# Patient Record
Sex: Female | Born: 1990 | Race: Black or African American | Hispanic: No | Marital: Single | Smoking: Former smoker
Health system: Southern US, Community
[De-identification: ages and names within clinical notes are randomized; demographics above are authoritative.]

## PROBLEM LIST (undated history)

## (undated) ENCOUNTER — Inpatient Hospital Stay (HOSPITAL_COMMUNITY): Payer: Self-pay

## (undated) DIAGNOSIS — G43909 Migraine, unspecified, not intractable, without status migrainosus: Secondary | ICD-10-CM

## (undated) DIAGNOSIS — Z202 Contact with and (suspected) exposure to infections with a predominantly sexual mode of transmission: Secondary | ICD-10-CM

## (undated) DIAGNOSIS — A749 Chlamydial infection, unspecified: Secondary | ICD-10-CM

## (undated) DIAGNOSIS — A549 Gonococcal infection, unspecified: Secondary | ICD-10-CM

## (undated) HISTORY — PX: NO PAST SURGERIES: SHX2092

## (undated) HISTORY — DX: Chlamydial infection, unspecified: A74.9

---
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
## 2011-01-05 ENCOUNTER — Telehealth: Payer: Self-pay | Admitting: Family Medicine

## 2011-01-05 NOTE — Telephone Encounter (Signed)
Left vm to return call, need to update address & phone number, once done see E. Odell to remail letter.

## 2011-01-15 ENCOUNTER — Encounter: Payer: Self-pay | Admitting: Family Medicine

## 2011-01-15 ENCOUNTER — Ambulatory Visit (INDEPENDENT_AMBULATORY_CARE_PROVIDER_SITE_OTHER): Payer: Medicaid Other | Admitting: Family Medicine

## 2011-01-15 DIAGNOSIS — R300 Dysuria: Secondary | ICD-10-CM

## 2011-01-15 DIAGNOSIS — N898 Other specified noninflammatory disorders of vagina: Secondary | ICD-10-CM

## 2011-01-15 DIAGNOSIS — N760 Acute vaginitis: Secondary | ICD-10-CM

## 2011-01-15 LAB — POCT URINALYSIS DIPSTICK
Bilirubin, UA: NEGATIVE
Blood, UA: NEGATIVE
Glucose, UA: NEGATIVE
Nitrite, UA: NEGATIVE

## 2011-01-15 LAB — POCT WET PREP (WET MOUNT): Trichomonas Wet Prep HPF POC: NEGATIVE

## 2011-01-16 ENCOUNTER — Encounter: Payer: Self-pay | Admitting: Family Medicine

## 2011-01-18 DIAGNOSIS — R300 Dysuria: Secondary | ICD-10-CM | POA: Insufficient documentation

## 2011-01-18 DIAGNOSIS — N898 Other specified noninflammatory disorders of vagina: Secondary | ICD-10-CM | POA: Insufficient documentation

## 2011-01-18 NOTE — Assessment & Plan Note (Signed)
Urinalysis does not indicate uti.  Will obtain urine culture and screen for STD's.  No red flag symptoms at this time.  Pt told to return for any red flag symptoms or new or worsening of symptoms.

## 2011-01-18 NOTE — Progress Notes (Signed)
  Subjective:    Patient ID: Shari Davidson, female    DOB: 1990-11-07, 20 y.o.   MRN: [id number]  HPI Dysuria: Pt describes as a "irritation" with urination off and on x 3 weeks.  No low back pain today.  No flank pain.   Occasional pain in lower abdomen- no currently. .  No urinary retention.  No urinary frequency.  No fever.    Vaginal discharge: X 1 week, white in color, no itching.  No odor.  Pt states she is not at risk for STD's.  Has not had unprotected sex.    Review of Systems    as per above.  Objective:   Physical Exam  Constitutional: She is oriented to person, place, and time. She appears well-developed and well-nourished.  Cardiovascular: Normal rate.   No murmur heard. Pulmonary/Chest: Effort normal. No respiratory distress. She has no wheezes.  Abdominal: Soft. She exhibits no distension. There is no tenderness. There is no rebound and no guarding.  Genitourinary: Vagina normal and uterus normal. There is no rash or lesion on the right labia. There is no rash or lesion on the left labia. Cervix exhibits no motion tenderness. Right adnexum displays no mass, no tenderness and no fullness. Left adnexum displays no mass, no tenderness and no fullness.       Scant white discharge  Neurological: She is alert and oriented to person, place, and time.  Skin: No rash noted.          Assessment & Plan:

## 2011-01-18 NOTE — Assessment & Plan Note (Signed)
Pt screened for GC/Chlam/trich/BV and yeast.  Will call pt if any medication needed with results.  If negative will send letter with results.

## 2011-01-19 ENCOUNTER — Ambulatory Visit: Payer: Medicaid Other | Admitting: Family Medicine

## 2011-02-28 ENCOUNTER — Inpatient Hospital Stay (INDEPENDENT_AMBULATORY_CARE_PROVIDER_SITE_OTHER)
Admission: RE | Admit: 2011-02-28 | Discharge: 2011-02-28 | Disposition: A | Discharge status: Home / Self Care | Payer: Medicaid Other | Source: Ambulatory Visit | Attending: Family Medicine | Admitting: Family Medicine

## 2011-02-28 DIAGNOSIS — N760 Acute vaginitis: Secondary | ICD-10-CM

## 2011-02-28 DIAGNOSIS — N390 Urinary tract infection, site not specified: Secondary | ICD-10-CM

## 2011-02-28 DIAGNOSIS — R51 Headache: Secondary | ICD-10-CM

## 2011-02-28 LAB — POCT URINALYSIS DIP (DEVICE)
Bilirubin Urine: NEGATIVE
Ketones, ur: NEGATIVE mg/dL
Nitrite: NEGATIVE
pH: 6 (ref 5.0–8.0)

## 2011-02-28 LAB — WET PREP, GENITAL: Trich, Wet Prep: NONE SEEN

## 2011-03-02 LAB — URINE CULTURE

## 2011-03-03 LAB — GC/CHLAMYDIA PROBE AMP, GENITAL: Chlamydia, DNA Probe: NEGATIVE

## 2011-03-19 ENCOUNTER — Ambulatory Visit (INDEPENDENT_AMBULATORY_CARE_PROVIDER_SITE_OTHER): Payer: Medicaid Other | Admitting: Family Medicine

## 2011-03-19 ENCOUNTER — Encounter: Payer: Self-pay | Admitting: Family Medicine

## 2011-03-19 DIAGNOSIS — R300 Dysuria: Secondary | ICD-10-CM

## 2011-03-19 DIAGNOSIS — R42 Dizziness and giddiness: Secondary | ICD-10-CM

## 2011-03-19 DIAGNOSIS — Z309 Encounter for contraceptive management, unspecified: Secondary | ICD-10-CM

## 2011-03-19 DIAGNOSIS — K5900 Constipation, unspecified: Secondary | ICD-10-CM

## 2011-03-19 DIAGNOSIS — G44209 Tension-type headache, unspecified, not intractable: Secondary | ICD-10-CM

## 2011-03-19 LAB — POCT URINE PREGNANCY: Preg Test, Ur: NEGATIVE

## 2011-03-19 LAB — POCT URINALYSIS DIPSTICK
Glucose, UA: NEGATIVE
Nitrite, UA: NEGATIVE
Urobilinogen, UA: 0.2

## 2011-03-19 MED ORDER — BISACODYL 10 MG RE SUPP: 10.0000 mg | RECTAL | Status: AC | PRN

## 2011-03-19 NOTE — Progress Notes (Signed)
  Subjective:    Patient ID: Shari Davidson, female    DOB: April 02, 1991, 20 y.o.   MRN: [id number]  HPI 1. Dizziness, vomiting, sharp pains a couple of days ago.  Resolved after that day. No more vomiting.  But today, complaining of lightheadedness when she stands up lasting a few seconds and headaches. Headaches are different from migraine headaches. Temporal, left-sided. No associated nausea/vomiting. No photo- or phonophobia.   Drinks 4 cups of liquids a day. Good appetite after that day of vomiting.   2. Constipation  No bowel movement past week. Usually no BM 2-4 weeks.  When she does get it, it is runny.  Tried Miralax but didn't help. Took one capful a day.  Now taking OTC tablets, 1 a day. Unsure of name.   Review of Systems Denies blood in urine or stool.  Denies chest pain, difficulty breathing.  Denies back pain.  Denies fevers, chills.  Dysuria. Treated for urine infection at UC recently. Finished course of antibiotics. Now still has some mild dysuria.  May have mild increase in stressors. But denies depression, feeling overwhelmed.     Objective:   Physical Exam Gen: NAD Psych: pleasant, engaged, appropriate to questions, slightly anxious-appearing, not depressed-appearing Mouth: MMM Retina: sharp discs Neuro: EOMI with no nystagmus, no focal deficits CV: RRR, normal S1/S2, no murmurs Abd: NABS, soft, ND, mild TTP lower quadrant Ext: warm, dry, 3-4 sec cap refill Back: no CVA TTP    Assessment & Plan:

## 2011-03-19 NOTE — Assessment & Plan Note (Signed)
Recently finished antibiotics for UTI diagnosed at an Urgent Care. Still occasional mild dysuria but UA today not indicative of infection. Reassured patient. May be due to dehydration. Encouraged fluids.

## 2011-03-19 NOTE — Assessment & Plan Note (Signed)
Mild. Lasting few seconds with positional changes. Likely due to dehydration. Vital signs good and appears well.  Encouraged hydration.

## 2011-03-19 NOTE — Assessment & Plan Note (Signed)
Persistent. Discussed different options with patient and patient would like suppository. Encouraged hydration and increasing fiber in diet.

## 2011-03-19 NOTE — Patient Instructions (Addendum)
For your constipation, try the suppository as needed. To prevent this, drink more fluids (8-10 cups a day) and eat more fruits, vegetables, and foods with fiber.  Drink more fluids to help your dizziness.  Try Tylenol 650mg  every 8 hours as needed for your headaches. If your headaches do not improve or get worse, please make a follow-up appointment.   Otherwise, please follow-up with your PCP at your next regular appointment.   High-Fiber Diet A high-fiber diet changes your normal diet to include more whole grains, legumes, fruits, and vegetables. Changes in the diet involve replacing refined carbohydrates with unrefined foods. The calorie level of the diet is essentially unchanged. The Dietary Reference Intake (recommended amount) for adult males is 38 grams per day. For adult females, it is 25 grams per day. Pregnant and lactating women should consume 28 grams of fiber per day. Fiber is the intact part of a plant that is not broken down during digestion. Functional fiber is fiber that has been isolated from the plant to provide a beneficial effect in the body. PURPOSE  Increase stool bulk.   Ease and regulate bowel movements.   Lower cholesterol.  INDICATIONS THAT YOU NEED MORE FIBER  Constipation and hemorrhoids.   Uncomplicated diverticulosis (intestine condition) and irritable bowel syndrome.   Weight management.   As a protective measure against hardening of the arteries (atherosclerosis), diabetes, and cancer.  NOTE OF CAUTION If you have a digestive or bowel problem, ask your caregiver for advice before adding high-fiber foods to your diet. Some of the following medical problems are such that a high-fiber diet should not be used without consulting your caregiver. DO NOT USE WITH:  Acute diverticulitis (intestine infection).   Partial small bowel obstructions.   Complicated diverticular disease involving bleeding, rupture (perforation), or abscess (boil, furuncle).    Presence of autonomic neuropathy (nerve damage) or gastric paresis (stomach cannot empty itself).  GUIDELINES FOR INCREASING FIBER IN THE DIET  Start adding fiber to the diet slowly. A gradual increase of about 5 more grams (2 slices of whole-wheat bread, 2 servings of most fruits or vegetables, or 1 bowl of high-fiber cereal) per day is best. Too rapid an increase in fiber may result in constipation, flatulence, and bloating.   Drink enough water and fluids to keep your urine clear or pale yellow. Water, juice, or caffeine-free drinks are recommended. Not drinking enough fluid may cause constipation.   Eat a variety of high-fiber foods rather than one type of fiber.   Try to increase your intake of fiber through using high-fiber foods rather than fiber pills or supplements that contain small amounts of fiber.   The goal is to change the types of food eaten. Do not supplement your present diet with high-fiber foods, but replace foods in your present diet.  INCLUDE A VARIETY OF FIBER SOURCES  Replace refined and processed grains with whole grains, canned fruits with fresh fruits, and incorporate other fiber sources. White rice, white breads, and most bakery goods contain little or no fiber.   Brown whole-grain rice, buckwheat oats, and many fruits and vegetables are all good sources of fiber. These include: broccoli, Brussels sprouts, cabbage, cauliflower, beets, sweet potatoes, white potatoes (skin on), carrots, tomatoes, eggplant, squash, berries, fresh fruits, and dried fruits.   Cereals appear to be the richest source of fiber. Cereal fiber is found in whole grains and bran. Bran is the fiber-rich outer coat of cereal grain, which is largely removed in refining. In whole-grain  cereals, the bran remains. In breakfast cereals, the largest amount of fiber is found in those with "bran" in their names. The fiber content is sometimes indicated on the label.   You may need to include additional  fruits and vegetables each day.   In baking, for 1 cup white flour, you may use the following substitutions:   1 cup whole-wheat flour minus 2 tablespoons.   1/2 cup white flour plus 1/2 cup whole-wheat flour.  References: Dietary Reference Intakes: Recommended Intakes for Individuals. [employer]. Institute of Medicine. Food and Nutrition Board. Document Released: 06/15/2005 Document Re-Released: 09/09/2009 [facility] Patient Information 2011 [place], [place].

## 2011-03-19 NOTE — Assessment & Plan Note (Signed)
Recommending Tylenol as needed.

## 2011-04-08 LAB — POCT RAPID STREP A: Streptococcus, Group A Screen (Direct): NEGATIVE

## 2011-05-04 ENCOUNTER — Ambulatory Visit (INDEPENDENT_AMBULATORY_CARE_PROVIDER_SITE_OTHER): Payer: Medicaid Other | Admitting: Family Medicine

## 2011-05-04 ENCOUNTER — Encounter: Payer: Self-pay | Admitting: Family Medicine

## 2011-05-04 DIAGNOSIS — N898 Other specified noninflammatory disorders of vagina: Secondary | ICD-10-CM

## 2011-05-04 DIAGNOSIS — Z309 Encounter for contraceptive management, unspecified: Secondary | ICD-10-CM

## 2011-05-04 DIAGNOSIS — R109 Unspecified abdominal pain: Secondary | ICD-10-CM

## 2011-05-04 DIAGNOSIS — N939 Abnormal uterine and vaginal bleeding, unspecified: Secondary | ICD-10-CM

## 2011-05-04 DIAGNOSIS — IMO0001 Reserved for inherently not codable concepts without codable children: Secondary | ICD-10-CM

## 2011-05-04 LAB — POCT URINALYSIS DIPSTICK
Bilirubin, UA: NEGATIVE
Nitrite, UA: NEGATIVE
Spec Grav, UA: 1.025
Urobilinogen, UA: 0.2
pH, UA: 6.5

## 2011-05-04 LAB — POCT WET PREP (WET MOUNT)

## 2011-05-04 LAB — POCT UA - MICROSCOPIC ONLY

## 2011-05-04 LAB — POCT URINE PREGNANCY: Preg Test, Ur: NEGATIVE

## 2011-05-04 MED ORDER — NORGESTIMATE-ETH ESTRADIOL 0.25-35 MG-MCG PO TABS: 1.0000 | ORAL_TABLET | Freq: Every day | ORAL | Status: DC

## 2011-05-04 NOTE — Patient Instructions (Addendum)
It was good to meet you today I think your, pain may be coming from different sources. He continues ibuprofen as needed for your abdominal pain Be sure to take Dulcolax or miralax EVERYDAY  as previously prescribed I have called in some birth control pills for you. Be sure to followup with your regular Dr. about this. If you develops any fever or, nausea, vomiting, diarrhea or any other concerning symptoms please give [place] a call. Followup with regular Dr. if you have any other questions or concerns God Bless, Doree Albee MD

## 2011-05-05 DIAGNOSIS — N939 Abnormal uterine and vaginal bleeding, unspecified: Secondary | ICD-10-CM | POA: Insufficient documentation

## 2011-05-05 LAB — GC/CHLAMYDIA PROBE AMP, GENITAL
Chlamydia, DNA Probe: NEGATIVE
GC Probe Amp, Genital: NEGATIVE

## 2011-05-05 NOTE — Assessment & Plan Note (Signed)
Likely secondary to normalization of menstrual cycle in the setting of being off depo for the last 6 months. Will obtain wet prep, GC/chlamydia. Urine pregnancy negative. No cervical motion tenderness on exam. Patient also started on Sprintec today. Patient instructed to followup with PCP.

## 2011-05-05 NOTE — Assessment & Plan Note (Signed)
No vaginal discharge noted on  Speculum exam today. Will treat pending results of wet prep.

## 2011-05-05 NOTE — Progress Notes (Signed)
  Subjective:    Patient ID: Shari Davidson, female    DOB: March 04, 1991, 20 y.o.   MRN: [id number]  HPI Abdominal pain and vaginal bleeding x1 week. Patient has been intermittently on Depo-Provera for birth control. Patient states that her last Depo shot was in April or May of this year. Patient has not been on any other form of birth control. Patient states her last menstrual period was 3-4 months ago. Patient reports she had recurrence of her menstrual cycle since last week. This is been associated with abdominal pain and headaches. Patient also reports mild vaginal discharge. No dysuria, fever, nausea, diarrhea. Patient is sexually active with one partner currently. Patient states his sexual activities protected. No history of STDs. Patient doesn't have a history of bacterial vaginosis in the past. Discharge today is not like previous bacterial vaginosis discharge. Patient will like to try another form of birth control. Patient has been on oral contraceptives. Patient would like to restart those today.   Review of Systems See HPI     Objective:   Physical Exam Gen: in bed, NAD ABD: S/NT/+ bowel sounds  GU: Normal external genitalia, noted blood cervical os and vagina, no visible discharge. No cervical motion tenderness on bimanual exam.   Assessment & Plan:

## 2011-05-06 ENCOUNTER — Encounter: Payer: Self-pay | Admitting: Family Medicine

## 2011-05-06 ENCOUNTER — Encounter (HOSPITAL_COMMUNITY): Payer: Self-pay | Admitting: Emergency Medicine

## 2011-05-06 ENCOUNTER — Emergency Department (HOSPITAL_COMMUNITY)
Admission: EM | Admit: 2011-05-06 | Discharge: 2011-05-07 | Discharge status: Against Medical Advice | Payer: Medicaid Other | Attending: Emergency Medicine | Admitting: Emergency Medicine

## 2011-05-06 DIAGNOSIS — R109 Unspecified abdominal pain: Secondary | ICD-10-CM | POA: Insufficient documentation

## 2011-05-06 NOTE — ED Notes (Signed)
Pt. REPORTS RUQ PAIN / VAGINAL DISCHARGE -SPOTTING  ONSET LAST WEEK, SEEN [facility] -PELVIC EXAM PERFORMED , DID NOT RECEIVE RESULTS.

## 2011-05-07 NOTE — ED Notes (Signed)
Pt called to exam room. No answer. Will call again shortly.

## 2011-06-03 ENCOUNTER — Encounter: Payer: Self-pay | Admitting: Family Medicine

## 2011-06-03 ENCOUNTER — Ambulatory Visit (INDEPENDENT_AMBULATORY_CARE_PROVIDER_SITE_OTHER): Payer: Medicaid Other | Admitting: Family Medicine

## 2011-06-03 DIAGNOSIS — Z309 Encounter for contraceptive management, unspecified: Secondary | ICD-10-CM

## 2011-06-03 DIAGNOSIS — Z3009 Encounter for other general counseling and advice on contraception: Secondary | ICD-10-CM | POA: Insufficient documentation

## 2011-06-03 MED ORDER — MEDROXYPROGESTERONE ACETATE 150 MG/ML IM SUSP
150.0000 mg | Freq: Once | INTRAMUSCULAR | Status: AC
  Administered 2011-06-03: 150 mg via INTRAMUSCULAR

## 2011-06-03 NOTE — Progress Notes (Signed)
  Subjective:    Patient ID: Shari Davidson, female    DOB: 08/15/1990, 21 y.o.   MRN: [id number]  HPI Patient was recent seen in clinic for your regular menses. Patient is noted to have irregular bleeding status post a long-standing history of Depo-Provera use. Patient was started on OCPs to help regulate cycle as well as birth control. Patient states she's been poorly compliant with his medication, missing multiple pills. Most recent sexual activity was one month ago. Menstrual cycle has been relatively inconsistent. However, patient states that she had a mattress cycle for 4 days last week. Patient desires to go back on Depo-Provera as patient doesn't have to be concerned about daily use.  Review of Systems See HPI     Objective:   Physical Exam Gen: up in chair, NAD CV: RRR, no murmurs auscultated PULM: CTAB, no wheezes, rales, rhoncii ABD: S/NT/+ bowel sounds  EXT: 2+ peripheral pulses   Assessment & Plan:

## 2011-06-03 NOTE — Assessment & Plan Note (Signed)
U. pregnant negative. Will start patient on Depo-Provera. Discuss overall birth control options and this is the method that the patient desires.

## 2011-06-03 NOTE — Patient Instructions (Signed)
It was good to see today I have started you on Depo-Provera Be sure to have no sexual activity for at least next week. Followup in 1-2 months. Call with any questions, God Bless, Doree Albee MD

## 2011-06-06 ENCOUNTER — Emergency Department (HOSPITAL_COMMUNITY)
Admission: EM | Admit: 2011-06-06 | Discharge: 2011-06-06 | Disposition: A | Discharge status: Home / Self Care | Payer: Medicaid Other | Attending: Emergency Medicine | Admitting: Emergency Medicine

## 2011-06-06 ENCOUNTER — Encounter (HOSPITAL_COMMUNITY): Payer: Self-pay | Admitting: *Deleted

## 2011-06-06 DIAGNOSIS — L299 Pruritus, unspecified: Secondary | ICD-10-CM | POA: Insufficient documentation

## 2011-06-06 DIAGNOSIS — J45909 Unspecified asthma, uncomplicated: Secondary | ICD-10-CM | POA: Insufficient documentation

## 2011-06-06 DIAGNOSIS — R21 Rash and other nonspecific skin eruption: Secondary | ICD-10-CM

## 2011-06-06 MED ORDER — DIPHENHYDRAMINE HCL 25 MG PO CAPS
ORAL_CAPSULE | ORAL | Status: AC
  Administered 2011-06-06: 25 mg
  Filled 2011-06-06: qty 1

## 2011-06-06 MED ORDER — DIPHENHYDRAMINE HCL 25 MG PO TABS: 25.0000 mg | ORAL_TABLET | Freq: Four times a day (QID) | ORAL | Status: DC

## 2011-06-06 MED ORDER — HYDROCORTISONE 1 % EX CREA: TOPICAL_CREAM | CUTANEOUS | Status: DC

## 2011-06-06 NOTE — ED Notes (Signed)
C/o rash, redness, itching and painful spots, on hand, fingers, neck & back, scattered. Onset Thursday.

## 2011-06-06 NOTE — ED Provider Notes (Signed)
History     CSN: [id number] Arrival date & time: 06/06/2011  1:44 AM   First MD Initiated Contact with Patient 06/06/11 0232      Chief Complaint  Patient presents with  . Rash    pain & itching, describes as insect bites     HPI  History provided by the patient. Patient presents with complaints of rash and itching over right hand right posterior neck and posterior shoulder area. Rash began yesterday. Rash is pruritic. Patient denies similar symptoms previously. Patient does not report any new soaps, lotions, clothing, laundry detergent, jewelry, pets. No one else in the family have similar rashes. Patient denies any fever, chills, sweats, confusion, neck stiffness or pain. Patient denies any difficulty breathing swelling of throat or shortness of breath. Patient denies any other significant past medical history.   Past Medical History  Diagnosis Date  . Asthma   . UTI (lower urinary tract infection) 12/14/2010    10K E.coli on ER visit for abdominal pain    History reviewed. No pertinent past surgical history.  History reviewed. No pertinent family history.  History  Substance Use Topics  . Smoking status: Never Smoker   . Smokeless tobacco: Not on file  . Alcohol Use: Yes     OCCASIONAL    OB History    Grav Para Term Preterm Abortions TAB SAB Ect Mult Living                  Review of Systems  Constitutional: Negative for fever and chills.  Respiratory: Negative for cough and shortness of breath.   Cardiovascular: Negative for chest pain and palpitations.  All other systems reviewed and are negative.    Allergies  Review of patient's allergies indicates no known allergies.  Home Medications   Current Outpatient Rx  Name Route Sig Dispense Refill  . ALBUTEROL SULFATE HFA 108 (90 BASE) MCG/ACT IN AERS Inhalation Inhale 2 puffs into the lungs every 6 (six) hours as needed. for shortness of breath.    . DOCUSATE SODIUM 100 MG PO CAPS Oral Take 100 mg by mouth  daily as needed. For constipation     . IBUPROFEN 600 MG PO TABS Oral Take 200 mg by mouth every 6 (six) hours as needed. For pain.      BP 119/60  Pulse 71  Temp(Src) 98.3 F (36.8 C) (Oral)  Resp 16  SpO2 99%  Physical Exam  Nursing note and vitals reviewed. Constitutional: She is oriented to person, place, and time. She appears well-developed and well-nourished. No distress.  HENT:  Head: Normocephalic.  Mouth/Throat: Oropharynx is clear and moist.  Cardiovascular: Normal rate, regular rhythm and normal heart sounds.   Pulmonary/Chest: Effort normal and breath sounds normal. No respiratory distress. She has no wheezes. She has no rales.  Abdominal: Soft. Bowel sounds are normal. There is no tenderness.  Neurological: She is alert and oriented to person, place, and time.  Skin: Skin is warm. Rash noted.       Erythematous individual papular lesions on right dorsal hand near second MCP and third MCP joints. Similar group of papules over right posterior neck.   Psychiatric: She has a normal mood and affect. Her behavior is normal.    ED Course  Procedures (including critical care time)   1. Rash     MDM  2:40 AM patient seen and evaluated. Patient in no acute distress.        Angus Seller, [place] 06/06/11 2037

## 2011-06-07 NOTE — ED Provider Notes (Signed)
Medical screening examination/treatment/procedure(s) were performed by non-physician practitioner and as supervising physician I was immediately available for consultation/collaboration.   Kieran Nachtigal, MD 06/07/11 0730 

## 2011-08-03 ENCOUNTER — Encounter (HOSPITAL_COMMUNITY): Payer: Self-pay | Admitting: *Deleted

## 2011-08-03 ENCOUNTER — Emergency Department (HOSPITAL_COMMUNITY)
Admission: EM | Admit: 2011-08-03 | Discharge: 2011-08-03 | Disposition: A | Discharge status: Home / Self Care | Payer: Self-pay | Attending: Emergency Medicine | Admitting: Emergency Medicine

## 2011-08-03 DIAGNOSIS — N760 Acute vaginitis: Secondary | ICD-10-CM | POA: Insufficient documentation

## 2011-08-03 DIAGNOSIS — J45909 Unspecified asthma, uncomplicated: Secondary | ICD-10-CM | POA: Insufficient documentation

## 2011-08-03 DIAGNOSIS — B373 Candidiasis of vulva and vagina: Secondary | ICD-10-CM | POA: Insufficient documentation

## 2011-08-03 DIAGNOSIS — B3731 Acute candidiasis of vulva and vagina: Secondary | ICD-10-CM | POA: Insufficient documentation

## 2011-08-03 DIAGNOSIS — A499 Bacterial infection, unspecified: Secondary | ICD-10-CM | POA: Insufficient documentation

## 2011-08-03 DIAGNOSIS — B9689 Other specified bacterial agents as the cause of diseases classified elsewhere: Secondary | ICD-10-CM | POA: Insufficient documentation

## 2011-08-03 LAB — WET PREP, GENITAL: Trich, Wet Prep: NONE SEEN

## 2011-08-03 MED ORDER — METRONIDAZOLE 500 MG PO TABS: 500.0000 mg | ORAL_TABLET | Freq: Two times a day (BID) | ORAL | Status: AC

## 2011-08-03 MED ORDER — FLUCONAZOLE 150 MG PO TABS
150.0000 mg | ORAL_TABLET | ORAL | Status: AC
  Administered 2011-08-03: 150 mg via ORAL
  Filled 2011-08-03: qty 1

## 2011-08-03 NOTE — ED Provider Notes (Signed)
History     CSN: [id number]  Arrival date & time 08/03/11  0028   First MD Initiated Contact with Patient 08/03/11 0054      Chief Complaint  Patient presents with  . Vaginal Discharge    (Consider location/radiation/quality/duration/timing/severity/associated sxs/prior treatment) HPI Comments: Patient here with vaginal discharge for the past 2 weeks - she states that the discharge is thin, white/yellow, malodorous and not associated with abdominal pain, nausea, vomiting, dysuria, hematuria.  She reports that she is on depo-provera for birth control and that she is also using condoms as well.  Patient is a 21 y.o. female presenting with vaginal discharge. The history is provided by the patient. No language interpreter was used.  Vaginal Discharge This is a new problem. The current episode started 1 to 4 weeks ago. The problem occurs constantly. The problem has been unchanged. Pertinent negatives include no abdominal pain, anorexia, arthralgias, chest pain, chills, coughing, diaphoresis, fatigue, fever, joint swelling, myalgias, nausea, numbness, rash, sore throat, swollen glands, urinary symptoms, visual change, vomiting or weakness. The symptoms are aggravated by nothing. She has tried nothing for the symptoms. The treatment provided no relief.    Past Medical History  Diagnosis Date  . Asthma   . UTI (lower urinary tract infection) 12/14/2010    10K E.coli on ER visit for abdominal pain    History reviewed. No pertinent past surgical history.  History reviewed. No pertinent family history.  History  Substance Use Topics  . Smoking status: Never Smoker   . Smokeless tobacco: Not on file  . Alcohol Use: Yes     OCCASIONAL    OB History    Grav Para Term Preterm Abortions TAB SAB Ect Mult Living                  Review of Systems  Constitutional: Negative for fever, chills, diaphoresis and fatigue.  HENT: Negative for sore throat.   Respiratory: Negative for cough.     Cardiovascular: Negative for chest pain.  Gastrointestinal: Negative for nausea, vomiting, abdominal pain and anorexia.  Genitourinary: Positive for vaginal discharge.  Musculoskeletal: Negative for myalgias, joint swelling and arthralgias.  Skin: Negative for rash.  Neurological: Negative for weakness and numbness.  All other systems reviewed and are negative.    Allergies  Review of patient's allergies indicates no known allergies.  Home Medications   Current Outpatient Rx  Name Route Sig Dispense Refill  . ALBUTEROL SULFATE HFA 108 (90 BASE) MCG/ACT IN AERS Inhalation Inhale 2 puffs into the lungs every 6 (six) hours as needed. for shortness of breath.    Marland Kitchen DIPHENHYDRAMINE HCL 25 MG PO TABS Oral Take 50 mg by mouth every 6 (six) hours as needed. For itching/hives    . IBUPROFEN 600 MG PO TABS Oral Take 200 mg by mouth every 6 (six) hours as needed. For pain.      BP 113/56  Pulse 64  Temp(Src) 98.4 F (36.9 C) (Oral)  Resp 18  SpO2 100%  Physical Exam  Nursing note and vitals reviewed. Constitutional: She is oriented to person, place, and time. She appears well-developed and well-nourished. No distress.  HENT:  Head: Normocephalic and atraumatic.  Right Ear: External ear normal.  Left Ear: External ear normal.  Nose: Nose normal.  Mouth/Throat: Oropharynx is clear and moist. No oropharyngeal exudate.  Eyes: Conjunctivae are normal. Pupils are equal, round, and reactive to light. No scleral icterus.  Neck: Normal range of motion. Neck supple.  Cardiovascular: Normal  rate, regular rhythm and normal heart sounds.  Exam reveals no gallop and no friction rub.   No murmur heard. Pulmonary/Chest: Effort normal and breath sounds normal. No respiratory distress. She exhibits no tenderness.  Abdominal: Soft. Bowel sounds are normal. She exhibits no distension and no mass.  Genitourinary: Pelvic exam was performed with patient supine. There is no rash or tenderness on the right  labia. There is no rash or tenderness on the left labia. Cervix exhibits no motion tenderness, no discharge and no friability. Right adnexum displays no mass and no tenderness. Left adnexum displays no mass and no tenderness. No tenderness or bleeding around the vagina. Vaginal discharge found.  Musculoskeletal: Normal range of motion.  Lymphadenopathy:    She has no cervical adenopathy.  Neurological: She is alert and oriented to person, place, and time. No cranial nerve deficit.  Skin: Skin is warm and dry. No rash noted. No erythema. No pallor.  Psychiatric: She has a normal mood and affect. Her behavior is normal. Judgment and thought content normal.    ED Course  Procedures (including critical care time)  Labs Reviewed  WET PREP, GENITAL - Abnormal; Notable for the following:    Yeast Wet Prep HPF POC FEW (*)    Clue Cells Wet Prep HPF POC MODERATE (*)    WBC, Wet Prep HPF POC MODERATE (*)    All other components within normal limits  POCT PREGNANCY, URINE  GC/CHLAMYDIA PROBE AMP, GENITAL   No results found.   Bacterial Vaginosis Vaginal Candiasis   MDM  Patient with BV and candidal infection.  I do not see any indication of cervicitis, no CMT, no friability and no purulent drainage so I do not suspect gonorrhea or chlamydia.  No abdominal pain at this time as well.        Izola Price [place], [place] 08/03/11 [phone]

## 2011-08-03 NOTE — ED Notes (Addendum)
Pt states she has been having vaginal bleeding for the past two weeks that increases postcoital .  Pt states she is on the Depo, which she started in December.  Periods have been irregular.  She has noticed the color change from "light to really dark" and says that she noticed a "foul' smell coming from her vagina today at work. Pt denies any pain urinating

## 2011-08-03 NOTE — ED Provider Notes (Signed)
Medical screening examination/treatment/procedure(s) were performed by non-physician practitioner and as supervising physician I was immediately available for consultation/collaboration.  Doug Sou, MD 08/03/11 [phone]

## 2011-08-03 NOTE — ED Notes (Signed)
The pt has had vaginal discharge for 1-2 weeks.  And she had  A bad odor to her discharge.  lmp 12

## 2011-08-04 LAB — GC/CHLAMYDIA PROBE AMP, GENITAL: Chlamydia, DNA Probe: POSITIVE — AB

## 2011-08-05 NOTE — ED Notes (Signed)
+   chlamydia Chart sent to EDP office for review. 

## 2011-08-06 ENCOUNTER — Encounter: Payer: Self-pay | Admitting: Family Medicine

## 2011-08-06 DIAGNOSIS — A749 Chlamydial infection, unspecified: Secondary | ICD-10-CM

## 2011-08-06 HISTORY — DX: Chlamydial infection, unspecified: A74.9

## 2011-08-06 NOTE — ED Notes (Signed)
Attempted to contact patient. No answer. Will try again later.

## 2011-08-06 NOTE — ED Notes (Signed)
Chart returned from EDP office. Prescribed Doxycycline 100 mg tab. One tab PO BID X 7 days. Prescribed by Jaynie Crumble.

## 2011-08-07 ENCOUNTER — Encounter: Payer: Self-pay | Admitting: *Deleted

## 2011-08-07 ENCOUNTER — Telehealth: Payer: Self-pay | Admitting: *Deleted

## 2011-08-07 ENCOUNTER — Encounter: Payer: Self-pay | Admitting: Family Medicine

## 2011-08-07 NOTE — Telephone Encounter (Signed)
Left message for patient to return call, she needs to schedule a nurse visit today for STD treatment.Busick, Rodena Medin

## 2011-08-07 NOTE — ED Notes (Signed)
Voice mail message left for patient to return call. 

## 2011-08-07 NOTE — Telephone Encounter (Signed)
Unable to reach patient, will send information to [facility] for positive STD from ED visit.Busick, Rodena Medin

## 2011-08-10 NOTE — ED Notes (Signed)
Unable to contact via phone/letter faxed to EDP office for review.

## 2011-08-19 ENCOUNTER — Ambulatory Visit: Payer: Self-pay

## 2011-08-24 ENCOUNTER — Encounter: Payer: Self-pay | Admitting: Family Medicine

## 2011-08-24 NOTE — Telephone Encounter (Signed)
I haven't been able to contact her by phone. Tried to leave a message on the numbers that we have, but didn't seem to work. I sent a letter today to contact us for treatment.  Will treat with Azithromycin 1 gm single dose if she hasn't already been treated at the health dept.

## 2011-08-27 ENCOUNTER — Encounter: Payer: Self-pay | Admitting: Family Medicine

## 2011-08-27 ENCOUNTER — Ambulatory Visit (INDEPENDENT_AMBULATORY_CARE_PROVIDER_SITE_OTHER): Payer: Self-pay | Admitting: Family Medicine

## 2011-08-27 ENCOUNTER — Other Ambulatory Visit: Payer: Self-pay | Admitting: Family Medicine

## 2011-08-27 VITALS — BP 111/76 | HR 76 | Ht 64.0 in | Wt 111.6 lb

## 2011-08-27 DIAGNOSIS — N939 Abnormal uterine and vaginal bleeding, unspecified: Secondary | ICD-10-CM

## 2011-08-27 DIAGNOSIS — A499 Bacterial infection, unspecified: Secondary | ICD-10-CM

## 2011-08-27 DIAGNOSIS — R51 Headache: Secondary | ICD-10-CM

## 2011-08-27 DIAGNOSIS — N760 Acute vaginitis: Secondary | ICD-10-CM

## 2011-08-27 DIAGNOSIS — Z9189 Other specified personal risk factors, not elsewhere classified: Secondary | ICD-10-CM

## 2011-08-27 DIAGNOSIS — Z202 Contact with and (suspected) exposure to infections with a predominantly sexual mode of transmission: Secondary | ICD-10-CM | POA: Insufficient documentation

## 2011-08-27 DIAGNOSIS — IMO0001 Reserved for inherently not codable concepts without codable children: Secondary | ICD-10-CM

## 2011-08-27 DIAGNOSIS — B9689 Other specified bacterial agents as the cause of diseases classified elsewhere: Secondary | ICD-10-CM

## 2011-08-27 DIAGNOSIS — Z309 Encounter for contraceptive management, unspecified: Secondary | ICD-10-CM

## 2011-08-27 DIAGNOSIS — N898 Other specified noninflammatory disorders of vagina: Secondary | ICD-10-CM

## 2011-08-27 DIAGNOSIS — G43909 Migraine, unspecified, not intractable, without status migrainosus: Secondary | ICD-10-CM

## 2011-08-27 DIAGNOSIS — N938 Other specified abnormal uterine and vaginal bleeding: Secondary | ICD-10-CM

## 2011-08-27 LAB — CBC
MCV: 95 fL (ref 78.0–100.0)
Platelets: 234 10*3/uL (ref 150–400)
RBC: 4.02 MIL/uL (ref 3.87–5.11)
RDW: 12.1 % (ref 11.5–15.5)
WBC: 4.5 10*3/uL (ref 4.0–10.5)

## 2011-08-27 LAB — HIV ANTIBODY (ROUTINE TESTING W REFLEX): HIV: NONREACTIVE

## 2011-08-27 MED ORDER — SUMATRIPTAN SUCCINATE 50 MG PO TABS: 50.0000 mg | ORAL_TABLET | ORAL | Status: DC | PRN

## 2011-08-27 MED ORDER — MEDROXYPROGESTERONE ACETATE 150 MG/ML IM SUSP
150.0000 mg | Freq: Once | INTRAMUSCULAR | Status: AC
  Administered 2011-08-27: 150 mg via INTRAMUSCULAR

## 2011-08-27 MED ORDER — KETOROLAC TROMETHAMINE 30 MG/ML IJ SOLN
30.0000 mg | Freq: Once | INTRAMUSCULAR | Status: AC
  Administered 2011-08-27: 30 mg via INTRAMUSCULAR

## 2011-08-27 MED ORDER — NORGESTIMATE-ETH ESTRADIOL 0.25-35 MG-MCG PO TABS: 3.0000 | ORAL_TABLET | Freq: Every day | ORAL | Status: DC

## 2011-08-27 NOTE — Assessment & Plan Note (Signed)
Treated in the ER and symptoms have resolved

## 2011-08-27 NOTE — Assessment & Plan Note (Signed)
Due to atrophy from Depo Will use BCP 3 daily for 7 days. Decrease to 2 daily if nausea develops

## 2011-08-27 NOTE — Progress Notes (Signed)
  Subjective:    Patient ID: Shari Davidson, female    DOB: [birth year], 21 y.o.   MRN: [id number]  HPIsince yesterday she has had a throbbing left temporal headache. Feels lightheaded and sees spots when she closes her eyes. She does not usually have flashing lights in her vision before headaches. She has not taken any medication for it. She was able to sleep but awoke with continued headache. No nausea or vomiting. She's only been stressed by insufficient sleep and relationship problems with her boyfriend. 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 She has this type of headache about 3 weeks times monthly. Usually takes ibuprofen for it. Not drinking much caffeine.   She [facility] and got 2 pills for treatment for Chlamydia. Her boyfriend also went and got treated. However he is angry at her because she believes she was exposed through sexual contact with her about different boy. She did not have any blood test at the health Department and would like to be tested for HIV.  she is due for her Depo-Provera shot and would like to get that today. She has had flow like a light period, However for the past month. She has no symptoms of pregnancy   Review of Systems     Objective:   Physical Exam  Constitutional:       Thin but well-appearing  Eyes: Conjunctivae and EOM are normal. Pupils are equal, round, and reactive to light. Right eye exhibits no discharge. Left eye exhibits no discharge. No scleral icterus.       Optic discs are normal  Neck: No thyromegaly present.  Cardiovascular: Normal rate and regular rhythm.   Abdominal: Soft. She exhibits no distension and no mass. There is no tenderness. There is no rebound and no guarding.  Lymphadenopathy:    She has no cervical adenopathy.  Neurological: She is alert. She displays normal reflexes. No cranial nerve deficit. She exhibits abnormal muscle tone. Coordination  normal.  Psychiatric: She has a normal mood and affect. Her behavior is normal.          Assessment & Plan:

## 2011-08-27 NOTE — Assessment & Plan Note (Signed)
Will test for HIV

## 2011-08-27 NOTE — Patient Instructions (Signed)
Take 3 tablets of birth control pills for a week to stop vaginal bleeding. Decrease to 2 tablets daily if it is nauseating.   You may take Ibuprofen for headache. Next time take Sumatriptan at first sign of headache. If you continue weekly headaches we may have to start a prevention medication.  I will call if any abnormality on lab tests.

## 2011-08-27 NOTE — Assessment & Plan Note (Signed)
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 Will use BCP for a week to stop bleeding

## 2011-08-27 NOTE — Assessment & Plan Note (Signed)
She was given Tordal im and will use Ibuprofen as needed. For recurrences she can use Sumitriptan which she has taken in the past.

## 2011-09-01 LAB — RPR

## 2011-09-05 ENCOUNTER — Encounter (HOSPITAL_COMMUNITY): Payer: Self-pay | Admitting: Emergency Medicine

## 2011-09-05 ENCOUNTER — Emergency Department (HOSPITAL_COMMUNITY)
Admission: EM | Admit: 2011-09-05 | Discharge: 2011-09-06 | Disposition: A | Discharge status: Home Health | Payer: Self-pay | Attending: Emergency Medicine | Admitting: Emergency Medicine

## 2011-09-05 DIAGNOSIS — Z79899 Other long term (current) drug therapy: Secondary | ICD-10-CM | POA: Insufficient documentation

## 2011-09-05 DIAGNOSIS — R109 Unspecified abdominal pain: Secondary | ICD-10-CM | POA: Insufficient documentation

## 2011-09-05 DIAGNOSIS — R000 Tachycardia, unspecified: Secondary | ICD-10-CM | POA: Insufficient documentation

## 2011-09-05 DIAGNOSIS — R110 Nausea: Secondary | ICD-10-CM | POA: Insufficient documentation

## 2011-09-05 DIAGNOSIS — J45909 Unspecified asthma, uncomplicated: Secondary | ICD-10-CM | POA: Insufficient documentation

## 2011-09-05 LAB — COMPREHENSIVE METABOLIC PANEL
ALT: 11 U/L (ref 0–35)
Alkaline Phosphatase: 55 U/L (ref 39–117)
BUN: 8 mg/dL (ref 6–23)
CO2: 26 mEq/L (ref 19–32)
Calcium: 9.9 mg/dL (ref 8.4–10.5)
GFR calc Af Amer: >90 mL/min (ref >90)
GFR calc non Af Amer: >90 mL/min (ref >90)
Glucose, Bld: 101 mg/dL — ABNORMAL HIGH (ref 70–99)
Potassium: 3.8 mEq/L (ref 3.5–5.1)
Sodium: 142 mEq/L (ref 135–145)
Total Protein: 7.4 g/dL (ref 6.0–8.3)

## 2011-09-05 LAB — URINALYSIS, ROUTINE W REFLEX MICROSCOPIC
Ketones, ur: NEGATIVE mg/dL
Leukocytes, UA: NEGATIVE
Protein, ur: NEGATIVE mg/dL
Urobilinogen, UA: 1 mg/dL (ref 0.0–1.0)

## 2011-09-05 LAB — URINE MICROSCOPIC-ADD ON

## 2011-09-05 LAB — CBC
HCT: 36.1 % (ref 36.0–46.0)
Hemoglobin: 12.5 g/dL (ref 12.0–15.0)
MCH: 31.7 pg (ref 26.0–34.0)
MCHC: 34.6 g/dL (ref 30.0–36.0)
RBC: 3.94 MIL/uL (ref 3.87–5.11)

## 2011-09-05 LAB — POCT PREGNANCY, URINE: Preg Test, Ur: NEGATIVE

## 2011-09-05 MED ORDER — FAMOTIDINE 20 MG PO TABS
20.0000 mg | ORAL_TABLET | Freq: Once | ORAL | Status: AC
  Administered 2011-09-05: 20 mg via ORAL
  Filled 2011-09-05: qty 1

## 2011-09-05 MED ORDER — ONDANSETRON 4 MG PO TBDP
4.0000 mg | ORAL_TABLET | Freq: Once | ORAL | Status: AC
  Administered 2011-09-05: 4 mg via ORAL
  Filled 2011-09-05: qty 1

## 2011-09-05 NOTE — ED Notes (Signed)
Registration at bedside.

## 2011-09-05 NOTE — ED Notes (Signed)
Patient given urine cup and OB wipe; patient instructed to wipe really well before collecting urine and to collect urine mid-stream.

## 2011-09-05 NOTE — ED Notes (Signed)
Patient complaining of episodes of nausea for the past week and a half; patient states that she becomes nauseous right after eating or while smelling foods.  Patient denies vomiting, but states that she will "gag" from the nausea.  Patient also reports abdominal pain (all four quadrants) and episodes of diarrhea over the last week and a half.  Patient denies abdominal pain at this time. Upon assessment, abdomen is soft and non-tender to touch.  Bowel sounds are present in all four quadrants.  Patient alert and oriented x4; PERRL present.  Upon arrival to room, patient changed into gown.  Will continue to monitor.

## 2011-09-05 NOTE — ED Provider Notes (Signed)
History     CSN: [id number]  Arrival date & time 09/05/11  2044   First MD Initiated Contact with Patient 09/05/11 2225      Chief Complaint  Patient presents with  . Nausea    (Consider location/radiation/quality/duration/timing/severity/associated sxs/prior treatment) HPI Comments: Patient has had upper abdominal pain, burning sensation, nausea, for the past week to week and a half.  She, reports she's also had some loose stools.  She is taking no over-the-counter products for the same.  She has not contacted her primary care physician for she's currently taking birth control and is sexually active.   The history is provided by the patient.    Past Medical History  Diagnosis Date  . Asthma   . UTI (lower urinary tract infection) 12/14/2010    10K E.coli on ER visit for abdominal pain  . Chlamydia 08/06/11    treated [facility]    History reviewed. No pertinent past surgical history.  History reviewed. No pertinent family history.  History  Substance Use Topics  . Smoking status: Current Some Day Smoker  . Smokeless tobacco: Not on file  . Alcohol Use: Yes     OCCASIONAL    OB History    Grav Para Term Preterm Abortions TAB SAB Ect Mult Living                  Review of Systems  Constitutional: Negative for fever.  Respiratory: Negative for cough and shortness of breath.   Cardiovascular: Negative for chest pain.  Gastrointestinal: Positive for nausea and abdominal pain. Negative for vomiting.  Genitourinary: Negative for dysuria and vaginal discharge.  Neurological: Negative for dizziness.    Allergies  Review of patient's allergies indicates no known allergies.  Home Medications   Current Outpatient Rx  Name Route Sig Dispense Refill  . ALBUTEROL SULFATE HFA 108 (90 BASE) MCG/ACT IN AERS Inhalation Inhale 2 puffs into the lungs every 6 (six) hours as needed. for shortness of breath.    . IBUPROFEN 600 MG PO TABS Oral Take 200 mg by mouth every 6  (six) hours as needed. For pain.    Marland Kitchen NORGESTIMATE-ETH ESTRADIOL 0.25-35 MG-MCG PO TABS Oral Take 3 tablets by mouth daily. 1 Package 11  . SUMATRIPTAN SUCCINATE 50 MG PO TABS Oral Take 1 tablet (50 mg total) by mouth every 2 (two) hours as needed. 10 tablet 11  . FAMOTIDINE 20 MG PO TABS Oral Take 1 tablet (20 mg total) by mouth 2 (two) times daily. 30 tablet 0  . ONDANSETRON HCL 4 MG PO TABS Oral Take 1 tablet (4 mg total) by mouth every 6 (six) hours. 12 tablet 0    BP 118/63  Pulse 102  Temp(Src) 98.9 F (37.2 C) (Oral)  Resp 16  SpO2 100%  LMP 08/17/2011  Physical Exam  Constitutional: She is oriented to person, place, and time. She appears well-developed and well-nourished.  Neck: Normal range of motion.  Cardiovascular: Regular rhythm.  Tachycardia present.   Pulmonary/Chest: Effort normal.  Abdominal: Soft. Bowel sounds are normal. She exhibits no distension. There is no tenderness.  Neurological: She is alert and oriented to person, place, and time.  Skin: Skin is warm.    ED Course  Procedures (including critical care time)  Labs Reviewed  URINALYSIS, ROUTINE W REFLEX MICROSCOPIC - Abnormal; Notable for the following:    Hgb urine dipstick SMALL (*)    All other components within normal limits  URINE MICROSCOPIC-ADD ON - Abnormal;  Notable for the following:    Squamous Epithelial / LPF MANY (*)    Bacteria, UA FEW (*)    All other components within normal limits  COMPREHENSIVE METABOLIC PANEL - Abnormal; Notable for the following:    Glucose, Bld 101 (*)    All other components within normal limits  POCT PREGNANCY, URINE  CBC   No results found.   1. Nausea   2. Abdominal pain       MDM  Will treat nausea, and GERD symptoms.  Will obtain CBC, and CMet,  to evaluate for potential gallbladder involvement        Arman Filter, NP 09/06/11 0013

## 2011-09-05 NOTE — ED Notes (Addendum)
Patient complaining of constant nausea after eating and smelling foods for the last week and a half; denies vomiting.  Patient reports episodes of diarrhea and generalized abdominal pain over the last week.  Denies urinary changes; denies vaginal discharge.

## 2011-09-06 MED ORDER — FAMOTIDINE 20 MG PO TABS: 20.0000 mg | ORAL_TABLET | Freq: Two times a day (BID) | ORAL | Status: DC

## 2011-09-06 MED ORDER — ONDANSETRON HCL 4 MG PO TABS: 4.0000 mg | ORAL_TABLET | Freq: Four times a day (QID) | ORAL | Status: AC

## 2011-09-06 NOTE — ED Notes (Signed)
Patient currently resting quietly in bed; no respiratory or acute distress noted.  Patient updated on plan of care; informed patient that we are currently waiting on test results to come back.  Patient has no other questions or concerns at this time; will continue to monitor.

## 2011-09-06 NOTE — ED Provider Notes (Signed)
Medical screening examination/treatment/procedure(s) were performed by non-physician practitioner and as supervising physician I was immediately available for consultation/collaboration.   Gwyneth Sprout, MD 09/06/11 [phone]

## 2011-09-06 NOTE — Discharge Instructions (Signed)
Abdominal Pain (Nonspecific)  Your exam might not show the exact reason you have abdominal pain. Since there are many different causes of abdominal pain, another checkup and more tests may be needed. It is very important to follow up for lasting (persistent) or worsening symptoms. A possible cause of abdominal pain in any person who still has his or her appendix is acute appendicitis. Appendicitis is often hard to diagnose. Normal blood tests, urine tests, ultrasound, and CT scans do not completely rule out early appendicitis or other causes of abdominal pain. Sometimes, only the changes that happen over time will allow appendicitis and other causes of abdominal pain to be determined. Other potential problems that may require surgery may also take time to become more apparent. Because of this, it is important that you follow all of the instructions below.  HOME CARE INSTRUCTIONS    Rest as much as possible.   Do not eat solid food until your pain is gone.   While adults or children have pain: A diet of water, weak decaffeinated tea, broth or bouillon, gelatin, oral rehydration solutions (ORS), frozen ice pops, or ice chips may be helpful.   When pain is gone in adults or children: Start a light diet (dry toast, crackers, applesauce, or white rice). Increase the diet slowly as long as it does not bother you. Eat no dairy products (including cheese and eggs) and no spicy, fatty, fried, or high-fiber foods.   Use no alcohol, caffeine, or cigarettes.   Take your regular medicines unless your caregiver told you not to.   Take any prescribed medicine as directed.   Only take over-the-counter or prescription medicines for pain, discomfort, or fever as directed by your caregiver. Do not give aspirin to children.  If your caregiver has given you a follow-up appointment, it is very important to keep that appointment. Not keeping the appointment could result in a permanent injury and/or lasting (chronic) pain and/or  disability. If there is any problem keeping the appointment, you must call to reschedule.   SEEK IMMEDIATE MEDICAL CARE IF:    Your pain is not gone in 24 hours.   Your pain becomes worse, changes location, or feels different.   You or your child has an oral temperature above 102 F (38.9 C), not controlled by medicine.   Your baby is older than 3 months with a rectal temperature of 102 F (38.9 C) or higher.   Your baby is 3 months old or younger with a rectal temperature of 100.4 F (38 C) or higher.   You have shaking chills.   You keep throwing up (vomiting) or cannot drink liquids.   There is blood in your vomit or you see blood in your bowel movements.   Your bowel movements become dark or black.   You have frequent bowel movements.   Your bowel movements stop (become blocked) or you cannot pass gas.   You have bloody, frequent, or painful urination.   You have yellow discoloration in the skin or whites of the eyes.   Your stomach becomes bloated or bigger.   You have dizziness or fainting.   You have chest or back pain.  MAKE SURE YOU:    Understand these instructions.   Will watch your condition.   Will get help right away if you are not doing well or get worse.  Document Released: 06/15/2005 Document Revised: 06/04/2011 Document Reviewed: 05/13/2009  [employer] Patient Information 2012 [employer]

## 2011-09-10 ENCOUNTER — Encounter: Payer: Self-pay | Admitting: Family Medicine

## 2011-10-08 ENCOUNTER — Encounter (HOSPITAL_COMMUNITY): Payer: Self-pay | Admitting: *Deleted

## 2011-10-08 ENCOUNTER — Emergency Department (INDEPENDENT_AMBULATORY_CARE_PROVIDER_SITE_OTHER)
Admission: EM | Admit: 2011-10-08 | Discharge: 2011-10-08 | Disposition: A | Discharge status: Home / Self Care | Payer: Self-pay | Source: Home / Self Care | Attending: Emergency Medicine | Admitting: Emergency Medicine

## 2011-10-08 DIAGNOSIS — N760 Acute vaginitis: Secondary | ICD-10-CM

## 2011-10-08 LAB — POCT PREGNANCY, URINE: Preg Test, Ur: NEGATIVE

## 2011-10-08 LAB — WET PREP, GENITAL: Yeast Wet Prep HPF POC: NONE SEEN

## 2011-10-08 MED ORDER — METRONIDAZOLE 500 MG PO TABS: 500.0000 mg | ORAL_TABLET | Freq: Two times a day (BID) | ORAL | Status: AC

## 2011-10-08 NOTE — ED Notes (Signed)
C/o vaginal pain and spotting blood after intercourse x 2 - 3 days.  States not using condom.  Does use depo shot for birth control.  States using lubricant does help some.  States has some vaginal discharge that is whitish/clearish that has some odor.  States she has hx of bacterial infections

## 2011-10-08 NOTE — ED Provider Notes (Signed)
History     CSN: [id number]  Arrival date & time 10/08/11  1725   First MD Initiated Contact with Patient 10/08/11 1726      Chief Complaint  Patient presents with  . Vaginal Pain    (Consider location/radiation/quality/duration/timing/severity/associated sxs/prior treatment) HPI Comments: Been having vaginal pain and mild spotting after intercourse the last 2-3 days. I have not using any other protection as I get the depo shot". He also noticed his discharge for about a week looks whitish and has a strong water to it. I have had this discharge before this bacterial vaginosis.  Patient denies any dysuria, fevers, pelvic pain, flank pain or vomiting  Patient is a 21 y.o. female presenting with vaginal pain. The history is provided by the patient.  Vaginal Pain This is a new problem. The current episode started more than 1 week ago. The problem occurs constantly. The problem has not changed since onset.Pertinent negatives include no chest pain, no abdominal pain, no headaches and no shortness of breath.    Past Medical History  Diagnosis Date  . Asthma   . UTI (lower urinary tract infection) 12/14/2010    10K E.coli on ER visit for abdominal pain  . Chlamydia 08/06/11    treated at [facility]gent Care    History reviewed. No pertinent past surgical history.  Family History  Problem Relation Age of Onset  . Hypertension Other     History  Substance Use Topics  . Smoking status: Current Some Day Smoker  . Smokeless tobacco: Not on file  . Alcohol Use: Yes     OCCASIONAL    OB History    Grav Para Term Preterm Abortions TAB SAB Ect Mult Living   0               Review of Systems  Constitutional: Negative for fever, chills and activity change.  Respiratory: Negative for shortness of breath.   Cardiovascular: Negative for chest pain.  Gastrointestinal: Negative for abdominal pain.  Genitourinary: Positive for vaginal discharge, vaginal pain and dyspareunia. Negative for  dysuria, decreased urine volume and pelvic pain.  Neurological: Negative for headaches.    Allergies  Review of patient's allergies indicates no known allergies.  Home Medications   Current Outpatient Rx  Name Route Sig Dispense Refill  . ALBUTEROL SULFATE HFA 108 (90 BASE) MCG/ACT IN AERS Inhalation Inhale 2 puffs into the lungs every 6 (six) hours as needed. for shortness of breath.    . FAMOTIDINE 20 MG PO TABS Oral Take 1 tablet (20 mg total) by mouth 2 (two) times daily. 30 tablet 0  . IBUPROFEN 600 MG PO TABS Oral Take 200 mg by mouth every 6 (six) hours as needed. For pain.    Marland Kitchen METRONIDAZOLE 500 MG PO TABS Oral Take 1 tablet (500 mg total) by mouth 2 (two) times daily. 14 tablet 0  . NORGESTIMATE-ETH ESTRADIOL 0.25-35 MG-MCG PO TABS Oral Take 3 tablets by mouth daily. 1 Package 11  . SUMATRIPTAN SUCCINATE 50 MG PO TABS Oral Take 1 tablet (50 mg total) by mouth every 2 (two) hours as needed. 10 tablet 11    BP 106/61  Pulse 82  Temp(Src) 98.8 F (37.1 C) (Oral)  Resp 18  SpO2 100%  LMP 09/14/2011  Physical Exam  Nursing note and vitals reviewed. Constitutional: Vital signs are normal. She appears well-developed and well-nourished. She does not appear ill.  HENT:  Head: Normocephalic.  Neck: Normal range of motion.  Cardiovascular:  No  murmur heard. Pulmonary/Chest: Effort normal.  Abdominal: Soft. She exhibits no distension. There is no tenderness. There is no rebound.  Genitourinary: No labial fusion. There is no rash, tenderness or lesion on the right labia. There is no rash, lesion or injury on the left labia. Cervix exhibits no motion tenderness, no discharge and no friability. Right adnexum displays no mass, no tenderness and no fullness. Left adnexum displays no mass, no tenderness and no fullness. No erythema or bleeding around the vagina. No foreign body around the vagina. Vaginal discharge found.  Skin: No rash noted.    ED Course  Procedures (including  critical care time)   Labs Reviewed  POCT PREGNANCY, URINE  WET PREP, GENITAL  GC/CHLAMYDIA PROBE AMP, GENITAL   No results found.   1. Vaginitis       MDM  Vaginal discharge clinically consistent with bacterial vaginosis. Also with concern of vaginal pain during sexual intercourse. On exam no lacerations or mucosal disruptions or cervical lacerations were seen. Discuss empirical treatment for bacterial vaginosis patient agree with treatment plan and followup care as necessary        Jimmie Molly, MD 10/08/11 1946

## 2011-10-09 NOTE — ED Notes (Signed)
GC/Chlamydia neg., Wet prep: Many clue cells, few WBC's.  Pt. adequately treated with Flagyl. Vassie Moselle 10/09/2011

## 2011-10-21 ENCOUNTER — Ambulatory Visit: Payer: Self-pay | Admitting: Family Medicine

## 2011-10-22 ENCOUNTER — Telehealth: Payer: Self-pay | Admitting: Family Medicine

## 2011-10-22 ENCOUNTER — Encounter: Payer: Self-pay | Admitting: Family Medicine

## 2011-10-22 ENCOUNTER — Ambulatory Visit (INDEPENDENT_AMBULATORY_CARE_PROVIDER_SITE_OTHER): Payer: Self-pay | Admitting: Family Medicine

## 2011-10-22 VITALS — BP 110/76 | HR 84 | Ht 64.0 in | Wt 117.0 lb

## 2011-10-22 DIAGNOSIS — N390 Urinary tract infection, site not specified: Secondary | ICD-10-CM

## 2011-10-22 DIAGNOSIS — R300 Dysuria: Secondary | ICD-10-CM

## 2011-10-22 LAB — POCT URINALYSIS DIPSTICK
Bilirubin, UA: NEGATIVE
Glucose, UA: NEGATIVE
Ketones, UA: NEGATIVE
Nitrite, UA: NEGATIVE

## 2011-10-22 LAB — POCT UA - MICROSCOPIC ONLY

## 2011-10-22 MED ORDER — CEPHALEXIN 500 MG PO CAPS: 500.0000 mg | ORAL_CAPSULE | Freq: Three times a day (TID) | ORAL | Status: DC

## 2011-10-22 NOTE — Patient Instructions (Signed)
It has been a pleasure to see you today. Please take the medication as prescribed. Make a f/u  appointment in 7 to 10 days or sooner if needed.   Urinary Tract Infection Infections of the urinary tract can start in several places. A bladder infection (cystitis), a kidney infection (pyelonephritis), and a prostate infection (prostatitis) are different types of urinary tract infections (UTIs). They usually get better if treated with medicines (antibiotics) that kill germs. Take all the medicine until it is gone. You or your child may feel better in a few days, but TAKE ALL MEDICINE or the infection may not respond and may become more difficult to treat. HOME CARE INSTRUCTIONS   Drink enough water and fluids to keep the urine clear or pale yellow. Cranberry juice is especially recommended, in addition to large amounts of water.   Avoid caffeine, tea, and carbonated beverages. They tend to irritate the bladder.   Alcohol may irritate the prostate.   Only take over-the-counter or prescription medicines for pain, discomfort, or fever as directed by your caregiver.  To prevent further infections:  Empty the bladder often. Avoid holding urine for long periods of time.   After a bowel movement, women should cleanse from front to back. Use each tissue only once.   Empty the bladder before and after sexual intercourse.  SEEK IMMEDIATE MEDICAL CARE IF:   There is severe back pain or lower abdominal pain.   You or your child develops chills.   You have a fever.    There is nausea or vomiting.   There is continued burning or discomfort with urination.  MAKE SURE YOU:   Understand these instructions.   Will watch your condition.   Will get help right away if you are not doing well or get worse.  Document Released: 03/25/2005 Document Revised: 06/04/2011 Document Reviewed: 10/28/2006 [facility] Patient Information 2012 [place], [place].

## 2011-10-22 NOTE — Assessment & Plan Note (Addendum)
Pt with urinary symptoms for about 20 days, urinalysis positive for pyuria and bacteria. No fever, no CVA tenderness. Plan: Will treat for acute uncomplicated UTI with 7 days course of keflex. (TID due to compliance)  Recommended to increase fluid intake. Urine Culture was order from the sample collected in the office.  Discussed red flags for worsening condition and f/u with PCP in 7 to 10 days.

## 2011-10-22 NOTE — Telephone Encounter (Signed)
Appointment scheduled today for work in.

## 2011-10-22 NOTE — Progress Notes (Signed)
  Subjective:    Patient ID: Shari Davidson, female    DOB: February 29, 1991, 21 y.o.   MRN: [id number]  HPI Pt comes today complaining of 2 and half weeks of dysuria, frequency and burning with urination. She went to the Urgent Care when her symptoms started and based on labs preformed there, she was recommended to take Metronidazole for 7 days. She took full course of abx and her symptoms did not improved. She denies vaginal discharge, fever, chills, nausea, vomiting or abdominal/back pain.    Review of Systems  Constitutional: Negative for fever and chills.  HENT: Negative.   Respiratory: Negative.   Cardiovascular: Negative.   Gastrointestinal: Negative.   Genitourinary: Positive for dysuria, urgency and frequency. Negative for vaginal bleeding, vaginal discharge, vaginal pain and pelvic pain.  Musculoskeletal: Negative.   Neurological: Negative.        Objective:   Physical Exam  Constitutional: She appears well-developed. No distress.  HENT:  Mouth/Throat: Oropharynx is clear and moist.  Eyes: Conjunctivae and EOM are normal. Pupils are equal, round, and reactive to light.  Neck: Normal range of motion.  Cardiovascular: Normal rate, regular rhythm and normal heart sounds.   No murmur heard. Pulmonary/Chest: Effort normal and breath sounds normal.  Abdominal: Soft. Bowel sounds are normal. She exhibits no distension. There is no tenderness. There is no rebound and no guarding.       No CVA tenderness.  Musculoskeletal: She exhibits no edema.  Neurological: She is alert.       No neurologic focalization.  Skin: No rash noted.          Assessment & Plan:

## 2011-10-22 NOTE — Telephone Encounter (Signed)
Patient is calling because she missed her appt yesterday and wants to be seen today for pain with urination.  Now she is at the point that she feels she has to go but can't.  Everyone is double booked for this afternon.

## 2011-10-26 ENCOUNTER — Telehealth: Payer: Self-pay | Admitting: Family Medicine

## 2011-10-26 DIAGNOSIS — N390 Urinary tract infection, site not specified: Secondary | ICD-10-CM

## 2011-10-26 MED ORDER — CIPROFLOXACIN HCL 500 MG PO TABS: 500.0000 mg | ORAL_TABLET | Freq: Two times a day (BID) | ORAL | Status: AC

## 2011-10-26 NOTE — Telephone Encounter (Signed)
Called pt numbers provided in our records. 1st number is her mother's cell phone and 2nd number is "no available", with no voicemail set. I was trying to reach her due to her Urine culture that came back positive for Staph and the medication she is taking needs to be changed to Ciprofloxacin. I will write the prescription for her to pick it up at the pharmacy. Pt will likely call tomorrow to our clinic since her mother will let her know we called her today.

## 2011-10-28 NOTE — Telephone Encounter (Signed)
Spoke with patient and message from Dr. Aviva Signs given.

## 2011-11-06 ENCOUNTER — Encounter (HOSPITAL_COMMUNITY): Payer: Self-pay | Admitting: *Deleted

## 2011-11-06 ENCOUNTER — Emergency Department (HOSPITAL_COMMUNITY)
Admission: EM | Admit: 2011-11-06 | Discharge: 2011-11-06 | Disposition: A | Discharge status: Home / Self Care | Payer: Self-pay | Attending: Emergency Medicine | Admitting: Emergency Medicine

## 2011-11-06 ENCOUNTER — Emergency Department (HOSPITAL_COMMUNITY): Payer: Self-pay

## 2011-11-06 DIAGNOSIS — J45909 Unspecified asthma, uncomplicated: Secondary | ICD-10-CM | POA: Insufficient documentation

## 2011-11-06 DIAGNOSIS — N760 Acute vaginitis: Secondary | ICD-10-CM | POA: Insufficient documentation

## 2011-11-06 DIAGNOSIS — R102 Pelvic and perineal pain: Secondary | ICD-10-CM

## 2011-11-06 DIAGNOSIS — N898 Other specified noninflammatory disorders of vagina: Secondary | ICD-10-CM | POA: Insufficient documentation

## 2011-11-06 DIAGNOSIS — B9689 Other specified bacterial agents as the cause of diseases classified elsewhere: Secondary | ICD-10-CM | POA: Insufficient documentation

## 2011-11-06 DIAGNOSIS — A499 Bacterial infection, unspecified: Secondary | ICD-10-CM | POA: Insufficient documentation

## 2011-11-06 DIAGNOSIS — N390 Urinary tract infection, site not specified: Secondary | ICD-10-CM | POA: Insufficient documentation

## 2011-11-06 DIAGNOSIS — N949 Unspecified condition associated with female genital organs and menstrual cycle: Secondary | ICD-10-CM | POA: Insufficient documentation

## 2011-11-06 LAB — URINALYSIS, ROUTINE W REFLEX MICROSCOPIC
Hgb urine dipstick: NEGATIVE
Nitrite: NEGATIVE
Specific Gravity, Urine: 1.017 (ref 1.005–1.030)
Urobilinogen, UA: 0.2 mg/dL (ref 0.0–1.0)

## 2011-11-06 LAB — PREGNANCY, URINE: Preg Test, Ur: NEGATIVE

## 2011-11-06 LAB — POCT PREGNANCY, URINE: Preg Test, Ur: NEGATIVE

## 2011-11-06 LAB — WET PREP, GENITAL: WBC, Wet Prep HPF POC: NONE SEEN

## 2011-11-06 MED ORDER — CIPROFLOXACIN HCL 500 MG PO TABS: 500.0000 mg | ORAL_TABLET | Freq: Two times a day (BID) | ORAL | Status: AC

## 2011-11-06 MED ORDER — METRONIDAZOLE 0.75 % VA GEL: 1.0000 | Freq: Every day | VAGINAL | Status: AC

## 2011-11-06 NOTE — ED Notes (Signed)
Pt taken to ultrasound. Will be returned to CDU 11 for pelvic exam.

## 2011-11-06 NOTE — Discharge Instructions (Signed)
RESOURCE GUIDE  Dental Problems  Patients with Medicaid: [facility]                     [facility] [address]                                           [address] Phone:  [phone]                                                  Phone:  [phone]  If unable to pay or uninsured, contact:  [facility] or [facility] to become qualified for the adult dental clinic.  Chronic Pain Problems Contact [facility]  [phone] Patients need to be referred by their primary care doctor.  Insufficient Money for Medicine Contact [employer]:  call "211" or [facility] 271-5999.  No Primary Care Doctor Call [facility]  832-8000 Other agencies that provide inexpensive medical care    [facility]  [phone]    [facility]  [phone]    [facility]  [phone]    Women[facility] Clinic  [phone]    [facility]  [phone]    [facility]  [phone]  Psychological Services [facility]  [phone] [facility]  [phone] [facility]   [phone] (emergency services [phone])  Substance Abuse Resources [facility]  336-882-2125 [facility] 336-784-9470 [facility] 336-285-9073 [facility] 336-845-3988 Residential & Outpatient Substance Abuse Program  800-659-3381  Abuse/Neglect [place] Child Abuse Hotline (336) 641-3795 [place] Child Abuse Hotline 800-378-5315 (After Hours)  Emergency Shelter [facility] (336) 271-5985  Maternity Homes [facility] (336) 275-9566 [facility] (704) 372-4663  MRSA Hotline #:   832-7006    [place] [facility]  [facility] [place]     [employer]                          [facility] [address] [place]                       [address]      [address]  [place]                                                [place]                            [place] Phone:  [phone]                                   Phone:  [phone]                 Phone:  [phone]  [facility] Phone:  [phone]    [facility] Child Abuse Hotline 503-763-4059 806-169-2654 (After Hours)   Take the prescriptions as directed.  Your gonorrhea and chlamydia culture is pending results, and you will receive a phone call in the next several days if it is positive. Call your regular medical doctor and your OB/GYN doctor on Monday to schedule a follow up appointment within the next week.  Return to the Emergency Department immediately if worsening.

## 2011-11-06 NOTE — ED Notes (Signed)
Pelvic cart setup in CDU 11.

## 2011-11-06 NOTE — ED Notes (Signed)
Pt states that after intercourse she has lower abdominal cramping and vaginal bleeding. States she went to PCP and had pelvic approx 3 weeks ago and did not hear anything back from them. Denies and pain with sexual intercourse.

## 2011-11-06 NOTE — ED Provider Notes (Signed)
History     CSN: [id number]  Arrival date & time 11/06/11  1624   First MD Initiated Contact with Patient 11/06/11 1723      Chief Complaint  Patient presents with  . Vaginal Bleeding  . Pelvic Pain    HPI Pt was seen at 1725.  Per pt, c/o gradual onset and persistence of constant pelvic "pain" and vaginal bleeding x3-4 weeks.  Describes the pain as "cramping," worse after having sex.  Pt has been eval at [facility] and [facility]D for same where "they ran some tests and I don't know what the results were."  Denies back pain, no fevers, no N/V/D, no rash, no CP/SOB.    Past Medical History  Diagnosis Date  . Asthma   . UTI (lower urinary tract infection) 12/14/2010    10K E.coli on ER visit for abdominal pain  . Chlamydia 08/06/11    treated at Urgent Care    History reviewed. No pertinent past surgical history.  Family History  Problem Relation Age of Onset  . Hypertension Other     History  Substance Use Topics  . Smoking status: Current Some Day Smoker  . Smokeless tobacco: Not on file  . Alcohol Use: Yes     OCCASIONAL    Review of Systems ROS: Statement: All systems negative except as marked or noted in the HPI; Constitutional: Negative for fever and chills. ; ; Eyes: Negative for eye pain, redness and discharge. ; ; ENMT: Negative for ear pain, hoarseness, nasal congestion, sinus pressure and sore throat. ; ; Cardiovascular: Negative for chest pain, palpitations, diaphoresis, dyspnea and peripheral edema. ; ; Respiratory: Negative for cough, wheezing and stridor. ; ; Gastrointestinal: Negative for nausea, vomiting, diarrhea, abdominal pain, blood in stool, hematemesis, jaundice and rectal bleeding. . ; ; Genitourinary: Negative for dysuria, flank pain and hematuria. ; GYN:  +vaginal bleeding, pelvic cramping.  No vaginal discharge, no vulvar pain. ; Musculoskeletal: Negative for back pain and neck pain. Negative for swelling and trauma.; ; Skin: Negative for pruritus, rash, abrasions,  blisters, bruising and skin lesion.; ; Neuro: Negative for headache, lightheadedness and neck stiffness. Negative for weakness, altered level of consciousness , altered mental status, extremity weakness, paresthesias, involuntary movement, seizure and syncope.      Allergies  Review of patient's allergies indicates no known allergies.  Home Medications   Current Outpatient Rx  Name Route Sig Dispense Refill  . ALBUTEROL SULFATE HFA 108 (90 BASE) MCG/ACT IN AERS Inhalation Inhale 2 puffs into the lungs every 6 (six) hours as needed. for shortness of breath.    . BENZOCAINE-RESORCINOL 5-2 % VA CREA Vaginal Place 1 application vaginally at bedtime.    . SUMATRIPTAN SUCCINATE 50 MG PO TABS Oral Take 50 mg by mouth every 2 (two) hours as needed. For migraine      BP 126/82  Pulse 94  Temp(Src) 98.1 F (36.7 C) (Oral)  Resp 16  SpO2 100%  LMP 09/14/2011  Physical Exam 1730: Physical examination:  Nursing notes reviewed; Vital signs and O2 SAT reviewed;  Constitutional: Well developed, Well nourished, Well hydrated, In no acute distress; Head:  Normocephalic, atraumatic; Eyes: EOMI, PERRL, No scleral icterus; ENMT: Mouth and pharynx normal, Mucous membranes moist; Neck: Supple, Full range of motion, No lymphadenopathy; Cardiovascular: Regular rate and rhythm, No murmur, rub, or gallop; Respiratory: Breath sounds clear & equal bilaterally, No rales, rhonchi, wheezes, or rub, Normal respiratory effort/excursion; Chest: Nontender, Movement normal; Abdomen: Soft, +suprapubic area tender to palp,  Nondistended, Normal bowel sounds; Genitourinary: No CVA tenderness; Pelvic exam performed with permission of pt and female ED RN assist during exam.  External genitalia w/o lesions. Vaginal vault with clear and brown discharge.  Cervix w/o lesions, no bleeding from os, not friable, GC/chlam and wet prep obtained and sent to lab.  Bimanual exam w/o CMT, uterine or adnexal tenderness.;  Extremities: Pulses  normal, No tenderness, No edema, No calf edema or asymmetry.; Neuro: AA&Ox3, Major CN grossly intact. Gait steady. No gross focal motor or sensory deficits in extremities.; Skin: Color normal, Warm, Dry.   ED Course  Procedures   MDM  MDM Reviewed: nursing note and vitals Interpretation: labs and ultrasound   Results for orders placed during the hospital encounter of 11/06/11  WET PREP, GENITAL      Component Value Range   Yeast Wet Prep HPF POC NONE SEEN  NONE SEEN    Trich, Wet Prep NONE SEEN  NONE SEEN    Clue Cells Wet Prep HPF POC FEW (*) NONE SEEN    WBC, Wet Prep HPF POC NONE SEEN  NONE SEEN   URINALYSIS, ROUTINE W REFLEX MICROSCOPIC      Component Value Range   Color, Urine YELLOW  YELLOW    APPearance CLOUDY (*) CLEAR    Specific Gravity, Urine 1.017  1.005 - 1.030    pH 6.5  5.0 - 8.0    Glucose, UA NEGATIVE  NEGATIVE (mg/dL)   Hgb urine dipstick NEGATIVE  NEGATIVE    Bilirubin Urine NEGATIVE  NEGATIVE    Ketones, ur NEGATIVE  NEGATIVE (mg/dL)   Protein, ur NEGATIVE  NEGATIVE (mg/dL)   Urobilinogen, UA 0.2  0.0 - 1.0 (mg/dL)   Nitrite NEGATIVE  NEGATIVE    Leukocytes, UA MODERATE (*) NEGATIVE   PREGNANCY, URINE      Component Value Range   Preg Test, Ur NEGATIVE  NEGATIVE   POCT PREGNANCY, URINE      Component Value Range   Preg Test, Ur NEGATIVE  NEGATIVE   URINE MICROSCOPIC-ADD ON      Component Value Range   Squamous Epithelial / LPF RARE  RARE    WBC, UA 11-20  <3 (WBC/hpf)   RBC / HPF 0-2  <3 (RBC/hpf)   Bacteria, UA RARE  RARE    Urine-Other AMORPHOUS URATES/PHOSPHATES     US Transvaginal Non-ob 11/06/2011  *RADIOLOGY REPORT*  Clinical Data:  Pelvic pain.  Bleeding.  Torsion.  Abscess.  Tubo- ovarian abscess.  TRANSABDOMINAL AND TRANSVAGINAL ULTRASOUND OF PELVIS DOPPLER ULTRASOUND OF OVARIES  Technique:  Both transabdominal and transvaginal ultrasound examinations of the pelvis were performed. Transabdominal technique was performed for global imaging of  the pelvis including uterus, ovaries, adnexal regions, and pelvic cul-de-sac.  It was necessary to proceed with endovaginal exam following the transabdominal exam to visualize the uterus, endometrium, both ovaries and both adnexal regions.  Color and duplex Doppler ultrasound was utilized to evaluate blood flow to the ovaries.  Comparison:  None.  Findings:  Uterus:  Anteverted anteflexed uterus.  Normal myometrial echotexture.  Endometrium:  Between 6 mm and 1 cm.  Normal for age.  Right ovary: 33 mm x 21 mm x 38 mm.  Normal physiologic appearance. Normal color flow.  Left ovary: 37 mm x 19 mm x 33 mm.  Normal physiologic appearance. Normal color flow.  .  Pulsed Doppler evaluation demonstrates normal low-resistance arterial and venous waveforms in both ovaries.  IMPRESSION: Normal exam.  No evidence of pelvic  mass or other significant abnormality.  No sonographic evidence for ovarian torsion.  Original Report Authenticated By: Andreas Newport, M.D.       7:25 PM:  EPIC chart review:  10/22/11 and 10/08/11 testing with +BV and +UTI with UC +coag neg staph sensitive to cipro.  Will tx for BV and UTI today.  GC/chlam pending (was negative 10/08/11 testing).  Dx testing d/w pt.  Questions answered.  Verb understanding, agreeable to d/c home with outpt f/u.  Pt encouraged to f/u with her PMD or OB/GYN for good continuity of care.          Laray Anger, DO 11/07/11 2038

## 2011-11-07 LAB — GC/CHLAMYDIA PROBE AMP, GENITAL: GC Probe Amp, Genital: NEGATIVE

## 2011-11-17 ENCOUNTER — Telehealth: Payer: Self-pay | Admitting: Family Medicine

## 2011-11-17 ENCOUNTER — Ambulatory Visit: Payer: Self-pay | Admitting: Family Medicine

## 2012-02-18 ENCOUNTER — Ambulatory Visit: Payer: Medicaid Other | Admitting: Family Medicine

## 2012-02-25 ENCOUNTER — Emergency Department (INDEPENDENT_AMBULATORY_CARE_PROVIDER_SITE_OTHER)
Admission: EM | Admit: 2012-02-25 | Discharge: 2012-02-25 | Disposition: A | Discharge status: Home / Self Care | Payer: Medicaid Other | Source: Home / Self Care | Attending: Family Medicine | Admitting: Family Medicine

## 2012-02-25 ENCOUNTER — Encounter (HOSPITAL_COMMUNITY): Payer: Self-pay | Admitting: *Deleted

## 2012-02-25 DIAGNOSIS — B373 Candidiasis of vulva and vagina: Secondary | ICD-10-CM

## 2012-02-25 LAB — POCT URINALYSIS DIP (DEVICE)
Bilirubin Urine: NEGATIVE
Hgb urine dipstick: NEGATIVE
Ketones, ur: NEGATIVE mg/dL
Specific Gravity, Urine: 1.025 (ref 1.005–1.030)
pH: 6 (ref 5.0–8.0)

## 2012-02-25 LAB — WET PREP, GENITAL

## 2012-02-25 MED ORDER — FLUCONAZOLE 150 MG PO TABS: 150.0000 mg | ORAL_TABLET | Freq: Once | ORAL | Status: AC

## 2012-02-25 MED ORDER — TERCONAZOLE 80 MG VA SUPP: 80.0000 mg | Freq: Every day | VAGINAL | Status: AC

## 2012-02-25 NOTE — ED Notes (Signed)
Pt    Reports    Symptoms    Of       Vaginal  Discharge       With  Foul  Odor          -     X  1  Week   She also  States  She  Was  tx  For   chlymydia    sev  Months ago  Was  Given   meds  She  Thinks it never  Cleared  Up

## 2012-02-25 NOTE — ED Provider Notes (Signed)
History     CSN: [id number]  Arrival date & time 02/25/12  1221   First MD Initiated Contact with Patient 02/25/12 1313      Chief Complaint  Patient presents with  . Vaginal Discharge    (Consider location/radiation/quality/duration/timing/severity/associated sxs/prior treatment) Patient is a 21 y.o. female presenting with vaginal discharge. The history is provided by the patient.  Vaginal Discharge This is a new problem. The current episode started more than 1 week ago (treated approx 1 mo ago with zithro for chlamydia, sx continue.). The problem has not changed since onset.   Past Medical History  Diagnosis Date  . Asthma   . UTI (lower urinary tract infection) 12/14/2010    10K E.coli on ER visit for abdominal pain  . Chlamydia 08/06/11    treated at Urgent Care    History reviewed. No pertinent past surgical history.  Family History  Problem Relation Age of Onset  . Hypertension Other     History  Substance Use Topics  . Smoking status: Current Some Day Smoker  . Smokeless tobacco: Not on file  . Alcohol Use: Yes     OCCASIONAL    OB History    Grav Para Term Preterm Abortions TAB SAB Ect Mult Living   0               Review of Systems  Constitutional: Negative.   Genitourinary: Positive for vaginal bleeding and vaginal discharge. Negative for dysuria, frequency and vaginal pain.    Allergies  Review of patient's allergies indicates no known allergies.  Home Medications   Current Outpatient Rx  Name Route Sig Dispense Refill  . ALBUTEROL SULFATE HFA 108 (90 BASE) MCG/ACT IN AERS Inhalation Inhale 2 puffs into the lungs every 6 (six) hours as needed. for shortness of breath.    . BENZOCAINE-RESORCINOL 5-2 % VA CREA Vaginal Place 1 application vaginally at bedtime.    Marland Kitchen FLUCONAZOLE 150 MG PO TABS Oral Take 1 tablet (150 mg total) by mouth once. 1 tablet 0  . SUMATRIPTAN SUCCINATE 50 MG PO TABS Oral Take 50 mg by mouth every 2 (two) hours as needed. For  migraine    . TERCONAZOLE 80 MG VA SUPP Vaginal Place 1 suppository (80 mg total) vaginally at bedtime. 3 suppository 0    BP 135/47  Pulse 76  Temp 98.6 F (37 C) (Oral)  Resp 16  SpO2 100%  Physical Exam  Nursing note and vitals reviewed. Constitutional: She is oriented to person, place, and time. She appears well-developed and well-nourished. No distress.  Abdominal: Soft. Bowel sounds are normal. There is no tenderness.  Genitourinary: Uterus normal.    Cervix exhibits no motion tenderness and no discharge. Right adnexum displays no tenderness. Left adnexum displays no tenderness. Vaginal discharge found.  Neurological: She is alert and oriented to person, place, and time.    ED Course  Procedures (including critical care time)   Labs Reviewed  POCT URINALYSIS DIP (DEVICE)  POCT PREGNANCY, URINE  GC/CHLAMYDIA PROBE AMP, GENITAL  WET PREP, GENITAL   No results found.   1. Candida vaginitis       MDM          Linna Hoff, MD 02/25/12 1348

## 2012-02-26 LAB — GC/CHLAMYDIA PROBE AMP, GENITAL: Chlamydia, DNA Probe: NEGATIVE

## 2012-02-26 MED ORDER — METRONIDAZOLE 500 MG PO TABS: 500.0000 mg | ORAL_TABLET | Freq: Two times a day (BID) | ORAL | Status: AC

## 2012-02-29 ENCOUNTER — Telehealth (HOSPITAL_COMMUNITY): Payer: Self-pay | Admitting: *Deleted

## 2012-02-29 NOTE — ED Notes (Signed)
Pt. called in for lab results. Pt. verified x 2 and given results. (GC/Chlamydia neg., Wet prep: mod. yeast, mod. clue cells, few WBC's)  Pt. told she was adequately treated with Flagyl for bacterial vaginosis and Terazol supp. for yeast. Vassie Moselle 02/29/2012

## 2012-03-01 ENCOUNTER — Telehealth (HOSPITAL_COMMUNITY): Payer: Self-pay | Admitting: *Deleted

## 2012-03-09 ENCOUNTER — Telehealth (HOSPITAL_COMMUNITY): Payer: Self-pay | Admitting: *Deleted

## 2012-03-09 NOTE — ED Notes (Signed)
Pt. called back on VM on 9/4 but did not leave a name.  9/10 I called back and found out who had called. I told her I had called her to make sure she got her Rx. of Flagyl at [facility].  She said she picked up the supp. and a pill. I told her it  That would have been the Diflucan. I said the doctor e-prescribed Flagyl and it would have been one pill twice a day for 7 days # 14.  She said she did not get it. I said if she still has vaginal discharge to pick up the Rx. of Flagyl.   Pt. instructed to no alcohol while taking this medication.  If any problems getting the Rx. to call back. Shari Davidson 03/09/2012

## 2012-04-14 ENCOUNTER — Emergency Department (INDEPENDENT_AMBULATORY_CARE_PROVIDER_SITE_OTHER)
Admission: EM | Admit: 2012-04-14 | Discharge: 2012-04-14 | Disposition: A | Discharge status: Home / Self Care | Payer: Medicaid Other | Source: Home / Self Care | Attending: Emergency Medicine | Admitting: Emergency Medicine

## 2012-04-14 ENCOUNTER — Encounter (HOSPITAL_COMMUNITY): Payer: Self-pay | Admitting: Emergency Medicine

## 2012-04-14 ENCOUNTER — Ambulatory Visit: Payer: Medicaid Other | Admitting: Family Medicine

## 2012-04-14 DIAGNOSIS — N760 Acute vaginitis: Secondary | ICD-10-CM

## 2012-04-14 DIAGNOSIS — A599 Trichomoniasis, unspecified: Secondary | ICD-10-CM

## 2012-04-14 DIAGNOSIS — A499 Bacterial infection, unspecified: Secondary | ICD-10-CM

## 2012-04-14 DIAGNOSIS — B9689 Other specified bacterial agents as the cause of diseases classified elsewhere: Secondary | ICD-10-CM

## 2012-04-14 LAB — WET PREP, GENITAL

## 2012-04-14 MED ORDER — METRONIDAZOLE 500 MG PO TABS: 500.0000 mg | ORAL_TABLET | Freq: Two times a day (BID) | ORAL | Status: DC

## 2012-04-14 NOTE — ED Provider Notes (Signed)
History     CSN: [id number]  Arrival date & time 04/14/12  4098   First MD Initiated Contact with Patient 04/14/12 1951      Chief Complaint  Patient presents with  . Vaginal Bleeding    (Consider location/radiation/quality/duration/timing/severity/associated sxs/prior treatment) HPI patient is a 21 year old female who reports vaginal discharge and spotting with intercourse for the last 4 days. She has one new sexual partner who is her boyfriend. Have not been using protection. Her vaginal discharge is thin, clear, and malodorous. She denies any abdominal pain, nausea, vomiting, diarrhea, fevers, chills, rashes, vaginal itching, vaginal burning, or dysuria.  Past Medical History  Diagnosis Date  . Asthma   . UTI (lower urinary tract infection) 12/14/2010    10K E.coli on ER visit for abdominal pain  . Chlamydia 08/06/11    treated at Urgent Care    History reviewed. No pertinent past surgical history.  Family History  Problem Relation Age of Onset  . Hypertension Other     History  Substance Use Topics  . Smoking status: Current Some Day Smoker  . Smokeless tobacco: Not on file  . Alcohol Use: Yes     OCCASIONAL    OB History    Grav Para Term Preterm Abortions TAB SAB Ect Mult Living   0               Review of Systems per history of present illness  Allergies  Review of patient's allergies indicates no known allergies.  Home Medications   Current Outpatient Rx  Name Route Sig Dispense Refill  . ALBUTEROL SULFATE HFA 108 (90 BASE) MCG/ACT IN AERS Inhalation Inhale 2 puffs into the lungs every 6 (six) hours as needed. for shortness of breath.    . BENZOCAINE-RESORCINOL 5-2 % VA CREA Vaginal Place 1 application vaginally at bedtime.    Marland Kitchen METRONIDAZOLE 500 MG PO TABS Oral Take 1 tablet (500 mg total) by mouth 2 (two) times daily. 14 tablet 0  . SUMATRIPTAN SUCCINATE 50 MG PO TABS Oral Take 50 mg by mouth every 2 (two) hours as needed. For migraine      BP  101/66  Pulse 78  Temp 98.7 F (37.1 C) (Oral)  Resp 16  SpO2 98%  Physical Exam  Constitutional: She appears well-developed and well-nourished. No distress.  Abdominal: Soft. She exhibits no distension and no mass. There is no tenderness. There is no rebound and no guarding.  Genitourinary: There is no rash, tenderness, lesion or injury on the right labia. There is no rash, tenderness, lesion or injury on the left labia. Uterus is not enlarged and not tender. Cervix exhibits friability. Cervix exhibits no motion tenderness and no discharge. Right adnexum displays no mass and no tenderness. Left adnexum displays no mass and no tenderness. No bleeding around the vagina. No signs of injury around the vagina. Vaginal discharge found.       Clear malodorous discharge present    ED Course  Procedures (including critical care time)  Labs Reviewed  WET PREP, GENITAL - Abnormal; Notable for the following:    Trich, Wet Prep MANY (*)     Clue Cells Wet Prep HPF POC MODERATE (*)     WBC, Wet Prep HPF POC MANY (*)     All other components within normal limits  POCT PREGNANCY, URINE  GC/CHLAMYDIA PROBE AMP, GENITAL   No results found.   1. Bacterial vaginosis   2. Trichomonas       MDM  Will treat patient with Flagyl twice daily for 7 days. GC/Chlamydia collected the patient will be contacted if either of these are positive.   Brent Bulla, MD 04/14/12 2110

## 2012-04-14 NOTE — ED Notes (Signed)
Pt c/o vaginal discharge with spotting after intercourse x 3 to 4 days ago. Pt denies vaginal irritation but is experiencing vaginal odor.   Pt denies any other symptoms.

## 2012-04-15 LAB — GC/CHLAMYDIA PROBE AMP, GENITAL: Chlamydia, DNA Probe: NEGATIVE

## 2012-04-15 NOTE — ED Provider Notes (Signed)
Medical screening examination/treatment/procedure(s) were performed by a resident physician and as supervising physician I was immediately available for consultation/collaboration.  Additionally, I saw the patient independently, verified the history, examined the patient and discussed the treatment plan with the resident.  Leslee Home, M.D.    Reuben Likes, MD 04/15/12 [phone]

## 2012-04-18 ENCOUNTER — Telehealth (HOSPITAL_COMMUNITY): Payer: Self-pay | Admitting: *Deleted

## 2012-04-18 NOTE — ED Notes (Signed)
Pt. called back.  Pt. verified x 2 and given results.  Pt. told she was adequately treated with Flagyl for bacterial vaginosis and trich. Pt. told she needs to come back for an injection of Rocephin and Zithromax for GC.  Pt. instructed to notify her partner, no sex for 1 week and to practice safe sex. Pt. told she can get HIV testing at the [facility]. STD clinic. Pt. said she is coming today for treatment. DHHS form completed and will be faxed after pt. is treated. Vassie Moselle 04/18/2012

## 2012-04-18 NOTE — ED Notes (Signed)
GC pos., Chlamydia neg., Wet prep: Many trich, mod. clue cells, many WBC's.  Pt. adequately treated with Flagyl for bacterial vaginosis  and trich.  Needs tx. for GC. I called and left a message on mobile and home numbers for pt. to call.

## 2012-04-19 ENCOUNTER — Telehealth (HOSPITAL_COMMUNITY): Payer: Self-pay | Admitting: *Deleted

## 2012-04-19 NOTE — ED Notes (Signed)
Pt. did not come for treatment yesterday or today. I called and left a message for pt. to call. Vassie Moselle 04/19/2012

## 2012-04-20 ENCOUNTER — Telehealth (HOSPITAL_COMMUNITY): Payer: Self-pay | Admitting: *Deleted

## 2012-04-20 ENCOUNTER — Encounter (HOSPITAL_COMMUNITY): Payer: Self-pay

## 2012-04-20 ENCOUNTER — Emergency Department (INDEPENDENT_AMBULATORY_CARE_PROVIDER_SITE_OTHER)
Admission: EM | Admit: 2012-04-20 | Discharge: 2012-04-20 | Disposition: A | Discharge status: Home / Self Care | Payer: Medicaid Other | Source: Home / Self Care

## 2012-04-20 DIAGNOSIS — N760 Acute vaginitis: Secondary | ICD-10-CM

## 2012-04-20 DIAGNOSIS — A599 Trichomoniasis, unspecified: Secondary | ICD-10-CM

## 2012-04-20 DIAGNOSIS — A5400 Gonococcal infection of lower genitourinary tract, unspecified: Secondary | ICD-10-CM

## 2012-04-20 DIAGNOSIS — B9689 Other specified bacterial agents as the cause of diseases classified elsewhere: Secondary | ICD-10-CM

## 2012-04-20 DIAGNOSIS — A549 Gonococcal infection, unspecified: Secondary | ICD-10-CM

## 2012-04-20 DIAGNOSIS — A499 Bacterial infection, unspecified: Secondary | ICD-10-CM

## 2012-04-20 HISTORY — DX: Gonococcal infection, unspecified: A54.9

## 2012-04-20 MED ORDER — CEFTRIAXONE SODIUM 250 MG IJ SOLR
250.0000 mg | Freq: Once | INTRAMUSCULAR | Status: AC
  Administered 2012-04-20: 250 mg via INTRAMUSCULAR

## 2012-04-20 MED ORDER — CEFTRIAXONE SODIUM 250 MG IJ SOLR
INTRAMUSCULAR | Status: AC
  Filled 2012-04-20: qty 250

## 2012-04-20 MED ORDER — LIDOCAINE HCL (PF) 1 % IJ SOLN
INTRAMUSCULAR | Status: AC
  Filled 2012-04-20: qty 5

## 2012-04-20 NOTE — ED Provider Notes (Signed)
Medical screening examination/treatment/procedure(s) were performed by non-physician practitioner and as supervising physician I was immediately available for consultation/collaboration.  Raynald Blend, MD 04/20/12 1247

## 2012-04-20 NOTE — ED Notes (Signed)
Requests vaginal gel tx for trichomonas instead of pills

## 2012-04-20 NOTE — ED Notes (Signed)
Discussed with Dr. Tressia Danas about the [facility] wanting pt.'s with GC to get Rocephin and Zithromax.  Pt. only got Rocephin.  She gave me a verbal order to call in Zithromax 1 gm po  X 1 dose to pt.'s pharmacy. I called pt.'s cousin and asked her to have pt. call me at [phone] or [phone] after 1700 and ask for me.  She said she will give her the message. Vassie Moselle 04/20/2012

## 2012-04-20 NOTE — ED Notes (Signed)
I called pt.'s cousin back and told her she did not need to get her to call me back. When I went to document  the call, I could see that the situation was already taken care of. DHHS form completed and faxed to the [facility]. Vassie Moselle 04/20/2012

## 2012-04-20 NOTE — ED Notes (Signed)
Patient here in [facility] on 9-29 for STD treatment; was informed of need for treatment and has returned

## 2012-04-20 NOTE — ED Notes (Signed)
Pt. did not come in for treatment.  I called and left a message to call with cousin.  She said she gave her the message before.  She thinks she did not call back because she got some medication. She will give her the message again. Vassie Moselle 04/20/2012

## 2012-04-20 NOTE — ED Provider Notes (Signed)
History     CSN: [id number]  Arrival date & time 04/20/12  0944   First MD Initiated Contact with Patient 04/20/12 1158      Chief Complaint  Patient presents with  . SEXUALLY TRANSMITTED DISEASE    (Consider location/radiation/quality/duration/timing/severity/associated sxs/prior treatment) HPI Comments: This patient was here earlier this week with symptoms of STD. She was positive for gonorrhea, Trichomonas and bacterial vaginosis. She was treated with Flagyl  for the trichomonas and bacterial vaginosis. She has 3 more days of treatment remaining. Her reason for being here today is to receive Rocephin for the gonorrhea.   Past Medical History  Diagnosis Date  . Asthma   . UTI (lower urinary tract infection) 12/14/2010    10K E.coli on ER visit for abdominal pain  . Chlamydia 08/06/11    treated at Urgent Care    History reviewed. No pertinent past surgical history.  Family History  Problem Relation Age of Onset  . Hypertension Other     History  Substance Use Topics  . Smoking status: Current Some Day Smoker  . Smokeless tobacco: Not on file  . Alcohol Use: Yes     OCCASIONAL    OB History    Grav Para Term Preterm Abortions TAB SAB Ect Mult Living   0               Review of Systems  Constitutional: Negative.   Genitourinary: Positive for vaginal discharge. Negative for frequency, difficulty urinating and pelvic pain.    Allergies  Review of patient's allergies indicates no known allergies.  Home Medications   Current Outpatient Rx  Name Route Sig Dispense Refill  . ALBUTEROL SULFATE HFA 108 (90 BASE) MCG/ACT IN AERS Inhalation Inhale 2 puffs into the lungs every 6 (six) hours as needed. for shortness of breath.    . BENZOCAINE-RESORCINOL 5-2 % VA CREA Vaginal Place 1 application vaginally at bedtime.    Marland Kitchen METRONIDAZOLE 500 MG PO TABS Oral Take 1 tablet (500 mg total) by mouth 2 (two) times daily. 14 tablet 0  . SUMATRIPTAN SUCCINATE 50 MG PO TABS Oral  Take 50 mg by mouth every 2 (two) hours as needed. For migraine      BP 111/74  Pulse 69  Temp 98.9 F (37.2 C) (Oral)  Resp 22  SpO2 98%  Physical Exam  Constitutional: She is oriented to person, place, and time. She appears well-developed and well-nourished. No distress.  Pulmonary/Chest: Effort normal.  Musculoskeletal: Normal range of motion.  Neurological: She is alert and oriented to person, place, and time.  Skin: Skin is warm and dry.  Psychiatric: She has a normal mood and affect.    ED Course  Procedures (including critical care time)  Labs Reviewed - No data to display No results found.   1. Gonorrhea   2. BV (bacterial vaginosis)   3. Trichomoniasis       MDM  She has been adequately treated for BV and trichomonas. She'll finish her course of Flagyl in 3 days. Rocephin 50 mg IM now. Recommend practicing safe sex with use of condoms.        Hayden Rasmussen, NP 04/20/12 1213

## 2012-04-25 ENCOUNTER — Telehealth (HOSPITAL_COMMUNITY): Payer: Self-pay | Admitting: *Deleted

## 2012-04-25 NOTE — ED Notes (Signed)
I called home number and got another phone number for pt. [phone].  I called. Pt. verified x 2 and explained to her that I knew she got the shot of Rocephin but the [facility] prefers we also treat with Zithromax 1 gm., when pt. has GC. Pt. wants Rx. called to [facility] on [address]. Pt. told to take all pills at once with food and to call back if she vomits the medication up.  Pt. Voiced understanding. Pt. Asked for Metrogel, because the Flagyl upsets her stomach. I told her if she just had the bacterial vaginosis we could but because she has trich also, she has to take the Flagyl.  Pt. said she only has a few left and will finish the Rx. DHHS form re-faxed with a note that pt. got a Rx. of Zithromax.

## 2012-06-16 ENCOUNTER — Ambulatory Visit: Payer: Medicaid Other | Admitting: Family Medicine

## 2012-06-16 ENCOUNTER — Telehealth: Payer: Self-pay | Admitting: Family Medicine

## 2012-06-16 NOTE — Telephone Encounter (Signed)
Needs to talk to nurse about her menses prob - she is on depo and has started bleeding a few days ago and states she is not supposed to be bleeding

## 2012-06-16 NOTE — Telephone Encounter (Signed)
Patient states she has been on Depo for past 6 years. For past 2-3 days has been having vaginal bleeding  , mostly spotting during day but at night bleeding is heavier. . States last time she bled like this she had an STD. No other discharge .  Also having dysuria. Appointment scheduled today for work in .

## 2012-06-30 ENCOUNTER — Encounter (HOSPITAL_COMMUNITY): Payer: Self-pay | Admitting: Cardiology

## 2012-06-30 ENCOUNTER — Emergency Department (HOSPITAL_COMMUNITY)
Admission: EM | Admit: 2012-06-30 | Discharge: 2012-06-30 | Disposition: A | Discharge status: Home / Self Care | Payer: Medicaid Other | Attending: Emergency Medicine | Admitting: Emergency Medicine

## 2012-06-30 DIAGNOSIS — Z87440 Personal history of urinary (tract) infections: Secondary | ICD-10-CM | POA: Insufficient documentation

## 2012-06-30 DIAGNOSIS — J45909 Unspecified asthma, uncomplicated: Secondary | ICD-10-CM | POA: Insufficient documentation

## 2012-06-30 DIAGNOSIS — F17200 Nicotine dependence, unspecified, uncomplicated: Secondary | ICD-10-CM | POA: Insufficient documentation

## 2012-06-30 DIAGNOSIS — N939 Abnormal uterine and vaginal bleeding, unspecified: Secondary | ICD-10-CM

## 2012-06-30 DIAGNOSIS — Z79899 Other long term (current) drug therapy: Secondary | ICD-10-CM | POA: Insufficient documentation

## 2012-06-30 DIAGNOSIS — Z3202 Encounter for pregnancy test, result negative: Secondary | ICD-10-CM | POA: Insufficient documentation

## 2012-06-30 DIAGNOSIS — R5381 Other malaise: Secondary | ICD-10-CM | POA: Insufficient documentation

## 2012-06-30 DIAGNOSIS — R110 Nausea: Secondary | ICD-10-CM | POA: Insufficient documentation

## 2012-06-30 DIAGNOSIS — N898 Other specified noninflammatory disorders of vagina: Secondary | ICD-10-CM | POA: Insufficient documentation

## 2012-06-30 DIAGNOSIS — Z8619 Personal history of other infectious and parasitic diseases: Secondary | ICD-10-CM | POA: Insufficient documentation

## 2012-06-30 LAB — URINALYSIS, ROUTINE W REFLEX MICROSCOPIC
Bilirubin Urine: NEGATIVE
Leukocytes, UA: NEGATIVE
Nitrite: NEGATIVE
Specific Gravity, Urine: 1.017 (ref 1.005–1.030)
Urobilinogen, UA: 1 mg/dL (ref 0.0–1.0)

## 2012-06-30 LAB — CBC
MCH: 30.5 pg (ref 26.0–34.0)
MCHC: 32.6 g/dL (ref 30.0–36.0)
Platelets: 262 10*3/uL (ref 150–400)
RBC: 4.1 MIL/uL (ref 3.87–5.11)

## 2012-06-30 LAB — BASIC METABOLIC PANEL
Calcium: 10.1 mg/dL (ref 8.4–10.5)
GFR calc non Af Amer: >90 mL/min (ref >90)
Sodium: 139 mEq/L (ref 135–145)

## 2012-06-30 LAB — POCT PREGNANCY, URINE: Preg Test, Ur: NEGATIVE

## 2012-06-30 MED ORDER — ONDANSETRON 4 MG PO TBDP
8.0000 mg | ORAL_TABLET | Freq: Once | ORAL | Status: AC
  Administered 2012-06-30: 8 mg via ORAL
  Filled 2012-06-30: qty 2

## 2012-06-30 NOTE — ED Notes (Signed)
Pt reports n/v and vaginal bleeding over the past couple of days. States she is on the depo shot and should not have a period. Denies any fever. Denies any abd pain, but some cramping.

## 2012-06-30 NOTE — ED Provider Notes (Signed)
History     CSN: [id number]  Arrival date & time 06/30/12  1747   First MD Initiated Contact with Patient 06/30/12 2001      Chief Complaint  Patient presents with  . Vaginal Bleeding  . Fatigue  . Nausea    (Consider location/radiation/quality/duration/timing/severity/associated sxs/prior treatment) Patient is a 22 y.o. female presenting with vaginal bleeding. The history is provided by the patient. No language interpreter was used.  Vaginal Bleeding This is a recurrent problem. The current episode started 1 to 4 weeks ago. Pertinent negatives include no fever, headaches or rash.   22 year old female with complaint of intermittent vaginal bleeding x4 weeks. States that her last shot was in November 2013. States that she has never had a spotting in between that the shots.  She vomited x1 yesterday.Denies vaginal discharge, fever, dysuria or pain. States that she has had Trichomonas in the past and wonders if she has that today.  Past Medical History  Diagnosis Date  . Asthma   . UTI (lower urinary tract infection) 12/14/2010    10K E.coli on ER visit for abdominal pain  . Chlamydia 08/06/11    treated at Urgent Care  . Gonorrhea 04/20/12    treated at Urgent Care    History reviewed. No pertinent past surgical history.  Family History  Problem Relation Age of Onset  . Hypertension Other     History  Substance Use Topics  . Smoking status: Current Some Day Smoker  . Smokeless tobacco: Not on file  . Alcohol Use: Yes     Comment: OCCASIONAL    OB History    Grav Para Term Preterm Abortions TAB SAB Ect Mult Living   0               Review of Systems  Constitutional: Negative.  Negative for fever.  HENT: Negative.   Eyes: Negative.   Respiratory: Negative.   Cardiovascular: Negative.   Gastrointestinal: Negative.   Genitourinary: Positive for vaginal bleeding. Negative for dysuria, urgency, frequency, hematuria, vaginal discharge and vaginal pain.  Skin: Negative  for rash.  Neurological: Negative.  Negative for headaches.  Psychiatric/Behavioral: Negative.   All other systems reviewed and are negative.    Allergies  Review of patient's allergies indicates no known allergies.  Home Medications   Current Outpatient Rx  Name  Route  Sig  Dispense  Refill  . ALBUTEROL SULFATE HFA 108 (90 BASE) MCG/ACT IN AERS   Inhalation   Inhale 2 puffs into the lungs every 6 (six) hours as needed. for shortness of breath.         . SUMATRIPTAN SUCCINATE 50 MG PO TABS   Oral   Take 50 mg by mouth every 2 (two) hours as needed. For migraine           BP 114/66  Pulse 79  Temp 98.5 F (36.9 C) (Oral)  Resp 18  SpO2 98%  Physical Exam  Nursing note and vitals reviewed. Constitutional: She is oriented to person, place, and time. She appears well-developed and well-nourished.  HENT:  Head: Normocephalic and atraumatic.  Eyes: Conjunctivae normal and EOM are normal. Pupils are equal, round, and reactive to light.  Neck: Normal range of motion. Neck supple.  Cardiovascular: Normal rate.   Pulmonary/Chest: Effort normal.  Abdominal: Soft.  Musculoskeletal: Normal range of motion. She exhibits no edema and no tenderness.  Neurological: She is alert and oriented to person, place, and time. She has normal reflexes.  Skin: Skin  is warm and dry.  Psychiatric: She has a normal mood and affect.    ED Course  Pelvic exam Date/Time: 06/30/2012 9:19 PM Performed by: Remi Haggard Authorized by: Remi Haggard Consent: Verbal consent obtained. Written consent obtained. Risks and benefits: risks, benefits and alternatives were discussed Consent given by: patient Patient understanding: patient states understanding of the procedure being performed Patient identity confirmed: verbally with patient, arm band, provided demographic data and hospital-assigned identification number Time out: Immediately prior to procedure a "time out" was called to verify the  correct patient, procedure, equipment, support staff and site/side marked as required. Preparation: Patient was prepped and draped in the usual sterile fashion. Local anesthesia used: no Patient tolerance: Patient tolerated the procedure well with no immediate complications. Comments: Small about of dark brown blood in the vaginal canal. Cervix is nonfriable and closed. Denies vaginal discharge at this time.   (including critical care time)  Labs Reviewed  CBC - Abnormal; Notable for the following:    WBC 3.6 (*)     All other components within normal limits  URINALYSIS, ROUTINE W REFLEX MICROSCOPIC  BASIC METABOLIC PANEL  POCT PREGNANCY, URINE  GC/CHLAMYDIA PROBE AMP  WET PREP, GENITAL   No results found.   No diagnosis found.    MDM  22 year old female coming in with intermittent vaginal bleeding times one month with a depo shot as birth control. Her hemaglobin is  12.5. She denies dizziness. Urine is unremarkable. GC and Chlamydia pending. Patient will followup with gyn of choice.  No vaginal discharge.         Remi Haggard, NP 07/01/12 1145

## 2012-06-30 NOTE — ED Notes (Signed)
Pt denies dizziness and lightheadedness; pt denies sensitivity to light or blurred vision; pt denies difficulty breathing; pt does not appear to be in acute distress; pt mentating appropriately.

## 2012-06-30 NOTE — ED Notes (Signed)
Pt ambulatory leaving ED by self. Pt verbalized understanding of d/c teaching and has no further questions. Pt states understanding need to follow up or when/if to come into ED. Pt does not appear to be in acute distress upon d/c.

## 2012-06-30 NOTE — ED Notes (Signed)
Pelvic Cart set up at bedside. 

## 2012-07-01 LAB — GC/CHLAMYDIA PROBE AMP: CT Probe RNA: NEGATIVE

## 2012-07-01 NOTE — ED Provider Notes (Signed)
Medical screening examination/treatment/procedure(s) were performed by non-physician practitioner and as supervising physician I was immediately available for consultation/collaboration.   Marielle Mantione, MD 07/01/12 1456 

## 2012-07-06 ENCOUNTER — Encounter: Payer: Self-pay | Admitting: Family Medicine

## 2012-07-06 ENCOUNTER — Ambulatory Visit (INDEPENDENT_AMBULATORY_CARE_PROVIDER_SITE_OTHER): Payer: Medicaid Other | Admitting: Family Medicine

## 2012-07-06 VITALS — BP 118/72 | HR 75 | Ht 64.0 in | Wt 117.0 lb

## 2012-07-06 DIAGNOSIS — N898 Other specified noninflammatory disorders of vagina: Secondary | ICD-10-CM

## 2012-07-06 DIAGNOSIS — N939 Abnormal uterine and vaginal bleeding, unspecified: Secondary | ICD-10-CM

## 2012-07-06 MED ORDER — NORGESTIMATE-ETH ESTRADIOL 0.25-35 MG-MCG PO TABS: 1.0000 | ORAL_TABLET | Freq: Every day | ORAL | Status: DC

## 2012-07-06 MED ORDER — IBUPROFEN 600 MG PO TABS: 600.0000 mg | ORAL_TABLET | Freq: Four times a day (QID) | ORAL | Status: DC | PRN

## 2012-07-06 MED ORDER — ONDANSETRON HCL 4 MG PO TABS: 4.0000 mg | ORAL_TABLET | Freq: Three times a day (TID) | ORAL | Status: DC | PRN

## 2012-07-06 NOTE — Patient Instructions (Addendum)
Your bleeding is probably hormonal from the depo shot. We are going to give you medicine to help the bleelding stop. I am prescribing a birth control pill to take as follows:  five pills on day 1, four pills on day 2, three pills on day 3, two pills on day 4, and one pill on day 5, then take 1 pill per day for 1 week. After this you will have a withdrawal bleed which should not be as long as what you are having now.  In addition, I am prescribing ibuprofen to help with the pain.   Please follow up in 2 weeks.

## 2012-07-06 NOTE — Assessment & Plan Note (Addendum)
1 month in duration without anemia. UPT negative on 01/02 and no evidence of infection based on workup in ED recently. Upon review of the chart, it appears that she has had abnormal vaginal bleeding in the past. 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 Will treat with high dose sprintec for 1 week (see scheduled doses on AVS) followed by one week of one pill daily. Also prescribed ibuprofen 600mg  and zofran for nausea. Patient to follow up after withdrawal bleed. If continues to experience bleeding, may consider pelvic ultrasound to rule out fibroids. Recommended smoking cessation in the context of high dose estrogen and increased risk of clots. Patient expressed understanding.

## 2012-07-06 NOTE — Progress Notes (Signed)
Patient ID: Shari Davidson    DOB: Nov 12, 1990, 22 y.o.   MRN: [id number] --- Subjective:  Shari Davidson is a 22 y.o.female who presents for ED follow up for vaginal bleeding. Was seen on 06/30/12 for prolonged vaginal bleeding x3weeks. Had pelvic exam which was normal. Wet prep showed BV. GC/Chl were negative and UPreg was negative as well.  She reports 1 month of vaginal bleeding, 3-4 tampons per day, associated with cramping. She has been on depo off and on for 6 years and consistently for the last year. Last shot was in November. She denies any light headedness, any dizziness with standing. Hemoglobin at ED was 12.5.    ROS: see HPI Past Medical History: reviewed and updated medications and allergies. Social History: Tobacco: 3 cigarettes daily  Objective: Filed Vitals:   07/06/12 1457  BP: 118/72  Pulse: 75    Physical Examination:   General appearance - alert, well appearing, and in no distress Chest - clear to auscultation, no wheezes, rales or rhonchi, symmetric air entry Heart - normal rate, regular rhythm, normal S1, S2, no murmurs, rubs, clicks or gallops Abdomen - soft, discomfort in periumbilical region, no masses or organomegaly

## 2012-07-20 ENCOUNTER — Ambulatory Visit: Payer: Medicaid Other | Admitting: Family Medicine

## 2012-08-02 ENCOUNTER — Ambulatory Visit (INDEPENDENT_AMBULATORY_CARE_PROVIDER_SITE_OTHER): Payer: Medicaid Other | Admitting: *Deleted

## 2012-08-02 ENCOUNTER — Other Ambulatory Visit: Payer: Self-pay | Admitting: Family Medicine

## 2012-08-02 DIAGNOSIS — Z309 Encounter for contraceptive management, unspecified: Secondary | ICD-10-CM

## 2012-08-02 MED ORDER — MEDROXYPROGESTERONE ACETATE 150 MG/ML IM SUSP: 150.0000 mg | INTRAMUSCULAR | Status: DC

## 2012-08-02 NOTE — Progress Notes (Signed)
Patient  in office today for Depo injection.  Patient has not received Depo in this office previously. States she has been receiving at [facility]. She signed an ROI and this is faxed to [facility]. Advised patient I will call her when we receive information . Call back  # [phone].

## 2012-08-03 ENCOUNTER — Ambulatory Visit (INDEPENDENT_AMBULATORY_CARE_PROVIDER_SITE_OTHER): Payer: Medicaid Other | Admitting: *Deleted

## 2012-08-03 DIAGNOSIS — Z309 Encounter for contraceptive management, unspecified: Secondary | ICD-10-CM

## 2012-08-03 MED ORDER — MEDROXYPROGESTERONE ACETATE 150 MG/ML IM SUSP
150.0000 mg | Freq: Once | INTRAMUSCULAR | Status: AC
  Administered 2012-08-03: 150 mg via INTRAMUSCULAR

## 2012-08-03 NOTE — Progress Notes (Signed)
Next depo due April 23 thru Nov 02, 2012. Reminder card given.

## 2012-08-03 NOTE — Progress Notes (Signed)
Recieved fax from [facility]  regarding date of last depo injection.  It is documented that Depo Provera 104 mg was given  subq LLQ abdomen on 04/21/2012. She was due to receive depo between Jan 9 and Jul 21, 2012. Since she is late with receiving Depo now, urine preg test is done and is negative. Patient reports no sex in past 2 weeks .   Consulted with Dr. Mauricio Po and he advises that the preparation of Depo that patient received at [facility] is same as the preparation we have of 150 mg IM.     Depo given today and patient is advised to use extra protection for next 7 days .  Appointment scheduled with  Dr. Gwenlyn Saran for follow up of irregular bleeding.  Patient states she has not been taking the birth control pills given at last visit. She only took for 5 days. Made her feel " sick "

## 2012-08-09 ENCOUNTER — Ambulatory Visit (INDEPENDENT_AMBULATORY_CARE_PROVIDER_SITE_OTHER): Payer: Medicaid Other | Admitting: Family Medicine

## 2012-08-09 ENCOUNTER — Encounter: Payer: Self-pay | Admitting: Family Medicine

## 2012-08-09 VITALS — BP 111/69 | HR 74 | Wt 119.0 lb

## 2012-08-09 DIAGNOSIS — N898 Other specified noninflammatory disorders of vagina: Secondary | ICD-10-CM

## 2012-08-09 DIAGNOSIS — R300 Dysuria: Secondary | ICD-10-CM

## 2012-08-09 DIAGNOSIS — N939 Abnormal uterine and vaginal bleeding, unspecified: Secondary | ICD-10-CM

## 2012-08-09 LAB — POCT UA - MICROSCOPIC ONLY

## 2012-08-09 LAB — POCT URINALYSIS DIPSTICK
Glucose, UA: NEGATIVE
Nitrite, UA: NEGATIVE
Urobilinogen, UA: 0.2

## 2012-08-09 NOTE — Assessment & Plan Note (Signed)
A: Symptoms per HPI. No evidence of infection on UA, though some likely dehydration with trace ketones and small protein.  P: Will follow-up on urine culture results and treat UTI if necessary. Otherwise advised good hydration. Pt to follow up with PCP for vaginal bleeding in 1-2 weeks. Please address any further concerns at that time.

## 2012-08-09 NOTE — Patient Instructions (Addendum)
Thank you for coming in, today! It was nice to see you. I'm not sure what your bleeding is being caused by.      I don't think we need to do any other bloodwork or labs, at the moment.      We may need to repeat some tests if you continue to have bleeding.      Now that you are back on Depo, I would like to see if your bleeding resolves or gets better on its own. Your urine does not look infected, but we will do a urine culture to see if any bacteria grow.      If it looks like you do have a UTI, we will call you and prescribe an antibiotic.      If you start to have fevers, nausea or vomiting, or if you start having worse pain when you urinate, call or come back to the clinic. Make an appointment to come back in 1-2 weeks to see Dr. Gwenlyn Saran. If you have continue problems in the meantime or have new issues arise, you can make an appointment to come back sooner. Please feel free to call any time with any questions or concerns. --Dr. Casper Harrison

## 2012-08-09 NOTE — Progress Notes (Signed)
Subjective:     Patient ID: Shari Davidson, female   DOB: August 26, 1990, 21 y.o.   MRN: [id number]  HPI: Pt presents for follow-up for continued vaginal bleeding (see brief history below). Pt reports her last bleeding was 2-3 days ago after intercourse. This was the first time she has tried having intercourse since about a month and a half ago. Pt describes the amount of blood as "enough to cover the condom." Otherwise reports "just a little spotting" the next morning. No bleeding or spotting since then, none today/actively. Pt did have some vaginal/low abdomen cramping after intercourse as well, for 5-10 minutes. Pt also describes dysuria, burning with urination for the last few days, with increased frequency and urge. Pt reports some "possible" clear vaginal discharge with what appeared to be dark flecks of blood in it, this morning, as well. No vaginal itching/burning. Pt states she has had a UTI in the past and this feels similar. Tested for STD's in the ED prior to visit with Dr. Gwenlyn Saran (described below), which were negative. She was not treated with any antibiotics. She has not had any new partners.  Brief history prior to this visit (per notes from Dr. Gwenlyn Saran and Myrlene Broker, RN): Pt was seen in the ED on 1/2 for prolonged vaginal bleeding for 3 weeks; pelvic exam in the ED was normal, GC/Chlamydia and urine pregnancy test were negative, and wet prep showed only a few clue cells. Pt was seen by PCP Dr. Gwenlyn Saran on 1/8 with continued bleeding and some cramping. Pt reported at that time her last Depo shot was in November 2013 and that she had been "on and off" Depo for 6 years (consistently for the last year, up until the last shot). Pt had no symptoms of anemia at that time, and Hb in the ED was 12.5. Pt was prescribed a short course of Sprintec but only took it for 5 days because it "made her feel bad." Pt restarted Depo injections here on 2/5 (previously had been getting shots at [facility]). Pregnancy  test was negative at that time.   Review of Systems: No fevers/chills, N/V. No chest pain or shortness of breath. No swelling/pain in lower extremities. No back pain. Pt does endorse being very sleepy at home; states she could sleep "almost any time" at will, but this is not new. No new lightheadedness, dizziness, or weakness.      Objective:   Physical Exam BP 111/69  Pulse 74  Wt 119 lb (53.978 kg)  BMI 20.42 kg/m2 Gen: non-toxic appearing young adult female; pleasant and appropriate in conversation, cooperative with exam/interview HEENT: EOMI, no cervical lymphadenopathy Cardio: RRR, no murmur appreciated Pulm: CTAB, no  Abd: soft, nontender, BS+; no CVA tenderness Ext: warm, well-perfused, distal pulses intact/symmetric GU: exam deferred; per HPI, no current active bleeding for >24-48 hours, no vaginal itching or continued discharge     Assessment:     Continued vaginal bleeding, now on Depo. Also with dysuria, without evidence for UTI on UA.     Plan:     Urine culture, follow-up with PCP. See Problem List Notes.

## 2012-08-09 NOTE — Assessment & Plan Note (Signed)
A: Recent history per HPI. No vaginal exam or tests performed/collected today, other than urine culture, as pt has recently been tested for STDs (with negative results) and reports no new sexual contacts and has no current active bleeding. Recently restarted Depo and did not complete previously prescribed oral Sprintec course.  P: Deferred repeat testing for infection or rechecking Hb, as above. As pt recently restarted Depo, would monitor to see if bleeding resolves on its own. Advised pt that spotting can occur unpredictably while on Depo. If bleeding continues (in relation to intercourse or not), would perform speculum/bimanual exams and could consider repeat STD tests. Pt to follow up with PCP in 1-2 weeks, or sooner if bleeding worsens.

## 2012-08-11 ENCOUNTER — Telehealth: Payer: Self-pay | Admitting: Family Medicine

## 2012-08-11 LAB — URINE CULTURE
Colony Count: NO GROWTH
Organism ID, Bacteria: NO GROWTH

## 2012-08-11 NOTE — Telephone Encounter (Signed)
Attempted to call pt to inform her that urine culture is negative and there is no need for abx. Would recommend pt to come back to clinic or go to Urgent Care if dysuria symptoms continue; otherwise pt should make/keep f/u appt (if one has already been made) with Dr. Gwenlyn Saran. No answer, left message informing pt to call the clinic or that someone will call her tomorrow or Monday (depending on when clinic is next open). Will route this message to front office staff and Red Team pools. Thank you! --CMS

## 2012-08-15 NOTE — Telephone Encounter (Signed)
Left message for patient to return call.Ricahrd Schwager Lee  

## 2012-08-19 ENCOUNTER — Ambulatory Visit: Payer: Medicaid Other | Admitting: Family Medicine

## 2012-10-19 ENCOUNTER — Ambulatory Visit (INDEPENDENT_AMBULATORY_CARE_PROVIDER_SITE_OTHER): Payer: Medicaid Other | Admitting: *Deleted

## 2012-10-19 DIAGNOSIS — Z309 Encounter for contraceptive management, unspecified: Secondary | ICD-10-CM

## 2012-10-19 MED ORDER — MEDROXYPROGESTERONE ACETATE 150 MG/ML IM SUSP
150.0000 mg | Freq: Once | INTRAMUSCULAR | Status: AC
  Administered 2012-10-19: 150 mg via INTRAMUSCULAR

## 2012-11-15 ENCOUNTER — Ambulatory Visit: Payer: Medicaid Other | Admitting: Family Medicine

## 2012-11-17 ENCOUNTER — Ambulatory Visit: Payer: Medicaid Other

## 2012-11-23 ENCOUNTER — Emergency Department (HOSPITAL_COMMUNITY)
Admission: EM | Admit: 2012-11-23 | Discharge: 2012-11-23 | Disposition: A | Discharge status: Home / Self Care | Payer: Medicaid Other | Attending: Emergency Medicine | Admitting: Emergency Medicine

## 2012-11-23 ENCOUNTER — Encounter (HOSPITAL_COMMUNITY): Payer: Self-pay | Admitting: Adult Health

## 2012-11-23 DIAGNOSIS — R21 Rash and other nonspecific skin eruption: Secondary | ICD-10-CM

## 2012-11-23 DIAGNOSIS — F17200 Nicotine dependence, unspecified, uncomplicated: Secondary | ICD-10-CM | POA: Insufficient documentation

## 2012-11-23 DIAGNOSIS — Z8619 Personal history of other infectious and parasitic diseases: Secondary | ICD-10-CM | POA: Insufficient documentation

## 2012-11-23 DIAGNOSIS — Z87440 Personal history of urinary (tract) infections: Secondary | ICD-10-CM | POA: Insufficient documentation

## 2012-11-23 DIAGNOSIS — L539 Erythematous condition, unspecified: Secondary | ICD-10-CM | POA: Insufficient documentation

## 2012-11-23 DIAGNOSIS — J45909 Unspecified asthma, uncomplicated: Secondary | ICD-10-CM | POA: Insufficient documentation

## 2012-11-23 MED ORDER — DIPHENHYDRAMINE HCL 25 MG PO TABS: 25.0000 mg | ORAL_TABLET | Freq: Four times a day (QID) | ORAL | Status: DC

## 2012-11-23 MED ORDER — PREDNISONE 20 MG PO TABS
60.0000 mg | ORAL_TABLET | Freq: Once | ORAL | Status: AC
  Administered 2012-11-23: 60 mg via ORAL
  Filled 2012-11-23: qty 3

## 2012-11-23 MED ORDER — KETOCONAZOLE 1 % EX SHAM: 5.0000 mL | MEDICATED_SHAMPOO | Freq: Two times a day (BID) | CUTANEOUS | Status: DC

## 2012-11-23 NOTE — ED Provider Notes (Signed)
History     CSN: [id number]  Arrival date & time 11/23/12  0001   First MD Initiated Contact with Patient 11/23/12 0030      Chief Complaint  Patient presents with  . Rash   HPI  History provided by the patient. The patient is a 22 year old female with no significant PMH who presents with complaints of persistent and worsened purred rash. Rash first began 2 weeks ago. She reports noticing a small area of erythema in a circular fashion to the right anterior forearm. Patient thought she may have had ringworm began using over-the-counter antifungal lotion. This was not helping and the rash seemed to spread up to the arms on both sides as well as her abdomen chest and back. She has had associated pruritus with the rash. She has not used any other treatments. She does report a past history of having pityriasis rosea rash in the past that was similar. Denies any known allergies or skin sensitivities. No other aggravating or alleviating factors. No other associated symptoms. No fever, chills or sweats. No cough or shortness of breath.    Past Medical History  Diagnosis Date  . Asthma   . UTI (lower urinary tract infection) 12/14/2010    10K E.coli on ER visit for abdominal pain  . Chlamydia 08/06/11    treated at Urgent Care  . Gonorrhea 04/20/12    treated at Urgent Care    History reviewed. No pertinent past surgical history.  Family History  Problem Relation Age of Onset  . Hypertension Other     History  Substance Use Topics  . Smoking status: Current Some Day Smoker  . Smokeless tobacco: Not on file  . Alcohol Use: Yes     Comment: OCCASIONAL    OB History   Grav Para Term Preterm Abortions TAB SAB Ect Mult Living   0               Review of Systems  Constitutional: Negative for fever, chills and diaphoresis.  HENT: Negative for sore throat.   Respiratory: Negative for cough and shortness of breath.   Gastrointestinal: Negative for nausea and vomiting.  Skin:  Positive for rash.  All other systems reviewed and are negative.    Allergies  Review of patient's allergies indicates no known allergies.  Home Medications   Current Outpatient Rx  Name  Route  Sig  Dispense  Refill  . medroxyPROGESTERone (DEPO-PROVERA) 150 MG/ML injection   Intramuscular   Inject 1 mL (150 mg total) into the muscle every 3 (three) months.   1 mL   6     BP 121/67  Temp(Src) 98.1 F (36.7 C) (Oral)  Resp 16  SpO2 98%  Physical Exam  Nursing note and vitals reviewed. Constitutional: She is oriented to person, place, and time. She appears well-developed and well-nourished. No distress.  HENT:  Head: Normocephalic.  Cardiovascular: Normal rate and regular rhythm.   Pulmonary/Chest: Effort normal and breath sounds normal.  Abdominal: Soft.  Musculoskeletal: Normal range of motion. She exhibits no edema.  Neurological: She is alert and oriented to person, place, and time.  Skin: Skin is warm and dry. Rash noted.  Erythematous maculopapular rash to the upper extremities, abdomen, torso and back.  Psychiatric: She has a normal mood and affect. Her behavior is normal.    ED Course  Procedures    1. Rash       MDM  Patient seen and evaluated. The patient is well in no  acute distress. She has tried lamotrigine over the skin without any improvements in rashes. This seems atypical for fungus. We'll give dose of prednisone here and recommend Benadryl with recommendations to follow up with dermatology.     Angus Seller, PA-C 11/23/12 [phone]

## 2012-11-23 NOTE — ED Provider Notes (Signed)
Medical screening examination/treatment/procedure(s) were performed by non-physician practitioner and as supervising physician I was immediately available for consultation/collaboration.  Fannye Myer L Satsuki Zillmer, MD 11/23/12 0732 

## 2012-11-23 NOTE — ED Notes (Signed)
Presents with rash to bilateral arms, trunk and breast, resembles ring worm. Pt c/o itchiness, began 3 weeks ago/.

## 2012-12-16 ENCOUNTER — Ambulatory Visit: Payer: Medicaid Other | Admitting: Family Medicine

## 2012-12-19 ENCOUNTER — Ambulatory Visit: Payer: Medicaid Other | Admitting: Family Medicine

## 2013-01-04 ENCOUNTER — Encounter (HOSPITAL_COMMUNITY): Payer: Self-pay | Admitting: *Deleted

## 2013-01-04 ENCOUNTER — Emergency Department (INDEPENDENT_AMBULATORY_CARE_PROVIDER_SITE_OTHER)
Admission: EM | Admit: 2013-01-04 | Discharge: 2013-01-04 | Disposition: A | Discharge status: Home / Self Care | Payer: Self-pay | Source: Home / Self Care

## 2013-01-04 DIAGNOSIS — K5289 Other specified noninfective gastroenteritis and colitis: Secondary | ICD-10-CM

## 2013-01-04 DIAGNOSIS — K529 Noninfective gastroenteritis and colitis, unspecified: Secondary | ICD-10-CM

## 2013-01-04 DIAGNOSIS — R197 Diarrhea, unspecified: Secondary | ICD-10-CM

## 2013-01-04 HISTORY — DX: Migraine, unspecified, not intractable, without status migrainosus: G43.909

## 2013-01-04 LAB — POCT URINALYSIS DIP (DEVICE)
Bilirubin Urine: NEGATIVE
Hgb urine dipstick: NEGATIVE
Nitrite: NEGATIVE
Urobilinogen, UA: 0.2 mg/dL (ref 0.0–1.0)
pH: 7.5 (ref 5.0–8.0)

## 2013-01-04 LAB — CBC WITH DIFFERENTIAL/PLATELET
Basophils Absolute: 0 10*3/uL (ref 0.0–0.1)
Basophils Relative: 0 % (ref 0–1)
Hemoglobin: 13.3 g/dL (ref 12.0–15.0)
MCHC: 34.5 g/dL (ref 30.0–36.0)
Neutro Abs: 2.3 10*3/uL (ref 1.7–7.7)
Neutrophils Relative %: 59 % (ref 43–77)
Platelets: 220 10*3/uL (ref 150–400)
RDW: 11.4 % — ABNORMAL LOW (ref 11.5–15.5)

## 2013-01-04 LAB — COMPREHENSIVE METABOLIC PANEL
Alkaline Phosphatase: 60 U/L (ref 39–117)
BUN: 16 mg/dL (ref 6–23)
GFR calc Af Amer: >90 mL/min (ref >90)
Glucose, Bld: 101 mg/dL — ABNORMAL HIGH (ref 70–99)
Potassium: 4.3 mEq/L (ref 3.5–5.1)
Total Protein: 7.2 g/dL (ref 6.0–8.3)

## 2013-01-04 LAB — LIPASE, BLOOD: Lipase: 27 U/L (ref 11–59)

## 2013-01-04 MED ORDER — ONDANSETRON 4 MG PO TBDP
8.0000 mg | ORAL_TABLET | Freq: Once | ORAL | Status: AC
  Administered 2013-01-04: 8 mg via ORAL

## 2013-01-04 MED ORDER — ONDANSETRON 4 MG PO TBDP
ORAL_TABLET | ORAL | Status: AC
  Filled 2013-01-04: qty 2

## 2013-01-04 MED ORDER — ONDANSETRON 4 MG PO TBDP: 4.0000 mg | ORAL_TABLET | Freq: Four times a day (QID) | ORAL | Status: DC | PRN

## 2013-01-04 NOTE — ED Notes (Signed)
C/o headache above her L eye, nausea and diarrhea for past 2-3 days.  No abdominal pain. No fever.

## 2013-01-04 NOTE — ED Provider Notes (Signed)
Medical screening examination/treatment/procedure(s) were performed by resident physician or non-physician practitioner and as supervising physician I was immediately available for consultation/collaboration.   Barkley Bruns MD.   Linna Hoff, MD 01/04/13 [phone]

## 2013-01-04 NOTE — ED Provider Notes (Signed)
History    CSN: [id number] Arrival date & time 01/04/13  1944  None    Chief Complaint  Patient presents with  . Diarrhea  . Nausea  . Headache   (Consider location/radiation/quality/duration/timing/severity/associated sxs/prior Treatment) HPI Comments: 22 year old female presents complaining of headache above her left eye, diarrhea for 2 days, and nausea for one day. This began with a headache. She says she has a history of migraines and this seems exactly like all of her migraines in the past. The diarrhea began yesterday and is watery, frequent, and associated with some bloating and abdominal discomfort. Starting today, she started feeling nauseous like she had to throw up, but not actually bad enough to vomit. No fever, chills, dizziness, hematochezia, vomiting. She does think she ate some chicken that may have been undercooked the day before this began.  Patient is a 22 y.o. female presenting with diarrhea and headaches.  Diarrhea Associated symptoms: headaches   Associated symptoms: no abdominal pain, no arthralgias, no chills, no fever, no myalgias and no vomiting   Headache Associated symptoms: diarrhea and fatigue   Associated symptoms: no abdominal pain, no cough, no dizziness, no fever, no myalgias, no nausea and no vomiting    Past Medical History  Diagnosis Date  . Asthma   . UTI (lower urinary tract infection) 12/14/2010    10K E.coli on ER visit for abdominal pain  . Chlamydia 08/06/11    treated at Urgent Care  . Gonorrhea 04/20/12    treated at Urgent Care  . Migraine    History reviewed. No pertinent past surgical history. Family History  Problem Relation Age of Onset  . Hypertension Other    History  Substance Use Topics  . Smoking status: Current Some Day Smoker -- 0.30 packs/day    Types: Cigarettes  . Smokeless tobacco: Not on file  . Alcohol Use: Yes     Comment: OCCASIONAL   OB History   Grav Para Term Preterm Abortions TAB SAB Ect Mult Living    0              Review of Systems  Constitutional: Positive for fatigue. Negative for fever and chills.  Eyes: Negative for visual disturbance.  Respiratory: Negative for cough and shortness of breath.   Cardiovascular: Negative for chest pain, palpitations and leg swelling.  Gastrointestinal: Positive for diarrhea and abdominal distention (bloating). Negative for nausea, vomiting, abdominal pain, constipation and blood in stool.  Endocrine: Negative for polydipsia and polyuria.  Genitourinary: Negative for dysuria, urgency and frequency.  Musculoskeletal: Negative for myalgias and arthralgias.  Skin: Negative for rash.  Neurological: Positive for headaches. Negative for dizziness, syncope, weakness and light-headedness.    Allergies  Review of patient's allergies indicates no known allergies.  Home Medications   Current Outpatient Rx  Name  Route  Sig  Dispense  Refill  . EXPIRED: diphenhydrAMINE (BENADRYL) 25 MG tablet   Oral   Take 1 tablet (25 mg total) by mouth every 6 (six) hours.   20 tablet   0   . diphenhydrAMINE (BENADRYL) 25 MG tablet   Oral   Take 1 tablet (25 mg total) by mouth every 6 (six) hours.   20 tablet   0   . KETOCONAZOLE, TOPICAL, 1 % SHAM   Apply externally   Apply 5 mLs topically 2 (two) times daily.   200 Bottle   0   . medroxyPROGESTERone (DEPO-PROVERA) 150 MG/ML injection   Intramuscular   Inject 1 mL (150  mg total) into the muscle every 3 (three) months.   1 mL   6   . ondansetron (ZOFRAN-ODT) 4 MG disintegrating tablet   Oral   Take 1 tablet (4 mg total) by mouth every 6 (six) hours as needed for nausea. PRN for nausea or vomiting   12 tablet   0    BP 119/84  Pulse 89  Temp(Src) 98.4 F (36.9 C) (Oral)  Resp 16  SpO2 100% Physical Exam  Nursing note and vitals reviewed. Constitutional: She is oriented to person, place, and time. Vital signs are normal. She appears well-developed and well-nourished. No distress.  HENT:   Head: Atraumatic.  Eyes: EOM are normal. Pupils are equal, round, and reactive to light.  Cardiovascular: Normal rate, regular rhythm and normal heart sounds.  Exam reveals no gallop and no friction rub.   No murmur heard. Pulmonary/Chest: Effort normal and breath sounds normal. No respiratory distress. She has no wheezes. She has no rales.  Abdominal: Soft. There is no hepatosplenomegaly. There is tenderness in the epigastric area. There is no rigidity, no rebound, no guarding, no CVA tenderness, no tenderness at McBurney's point and negative Murphy's sign.  Neurological: She is alert and oriented to person, place, and time. She has normal strength.  Skin: Skin is warm and dry. She is not diaphoretic.  Psychiatric: She has a normal mood and affect. Her behavior is normal. Judgment normal.    ED Course  Procedures (including critical care time) Labs Reviewed  COMPREHENSIVE METABOLIC PANEL  LIPASE, BLOOD  CBC WITH DIFFERENTIAL  POCT URINALYSIS DIP (DEVICE)  POCT PREGNANCY, URINE   No results found. 1. Diarrhea   2. Gastroenteritis     MDM  This seems to be a very early gastroenteritis. We'll treat with Zofran and push by mouth fluids. Sending labs today to check for any other abnormalities, we will call her tomorrow if anything comes back positive. There are no signs symptoms to indicate acute abdomen   Meds ordered this encounter  Medications  . ondansetron (ZOFRAN-ODT) disintegrating tablet 8 mg    Sig:   . ondansetron (ZOFRAN-ODT) 4 MG disintegrating tablet    Sig: Take 1 tablet (4 mg total) by mouth every 6 (six) hours as needed for nausea. PRN for nausea or vomiting    Dispense:  12 tablet    Refill:  0     Graylon Good, PA-C 01/04/13 [phone]

## 2013-02-10 ENCOUNTER — Encounter (HOSPITAL_COMMUNITY): Payer: Self-pay | Admitting: Emergency Medicine

## 2013-02-10 ENCOUNTER — Emergency Department (INDEPENDENT_AMBULATORY_CARE_PROVIDER_SITE_OTHER)
Admission: EM | Admit: 2013-02-10 | Discharge: 2013-02-10 | Disposition: A | Discharge status: Home / Self Care | Payer: Self-pay | Source: Home / Self Care

## 2013-02-10 DIAGNOSIS — W57XXXA Bitten or stung by nonvenomous insect and other nonvenomous arthropods, initial encounter: Secondary | ICD-10-CM

## 2013-02-10 MED ORDER — HYDROXYZINE HCL 25 MG PO TABS: 25.0000 mg | ORAL_TABLET | Freq: Four times a day (QID) | ORAL | Status: DC

## 2013-02-10 MED ORDER — FLUTICASONE PROPIONATE 0.05 % EX CREA: TOPICAL_CREAM | Freq: Two times a day (BID) | CUTANEOUS | Status: DC

## 2013-02-10 NOTE — ED Notes (Signed)
Insect bite to right cheek with lip swelling this a.m. Bite to left anterior side of foot and right ankle.  Pt has not taken otc meds to help with reaction. Areas are red and swollen. Bites noticed yesterday Pt alert and oriented.

## 2013-02-10 NOTE — ED Provider Notes (Signed)
CSN: [id number]     Arrival date & time 02/10/13  1137 History     None    Chief Complaint  Patient presents with  . Insect Bite   (Consider location/radiation/quality/duration/timing/severity/associated sxs/prior Treatment) Patient is a 22 y.o. female presenting with rash. The history is provided by the patient.  Rash Onset quality:  Sudden Duration:  1 day Progression:  Unchanged Chronicity:  New   Past Medical History  Diagnosis Date  . Asthma   . UTI (lower urinary tract infection) 12/14/2010    10K E.coli on ER visit for abdominal pain  . Chlamydia 08/06/11    treated at Urgent Care  . Gonorrhea 04/20/12    treated at Urgent Care  . Migraine    History reviewed. No pertinent past surgical history. Family History  Problem Relation Age of Onset  . Hypertension Other    History  Substance Use Topics  . Smoking status: Current Some Day Smoker -- 0.30 packs/day    Types: Cigarettes  . Smokeless tobacco: Not on file  . Alcohol Use: Yes     Comment: OCCASIONAL   OB History   Grav Para Term Preterm Abortions TAB SAB Ect Mult Living   0              Review of Systems  Constitutional: Negative.   Musculoskeletal: Negative.   Skin: Positive for rash.    Allergies  Review of patient's allergies indicates no known allergies.  Home Medications   Current Outpatient Rx  Name  Route  Sig  Dispense  Refill  . EXPIRED: diphenhydrAMINE (BENADRYL) 25 MG tablet   Oral   Take 1 tablet (25 mg total) by mouth every 6 (six) hours.   20 tablet   0   . diphenhydrAMINE (BENADRYL) 25 MG tablet   Oral   Take 1 tablet (25 mg total) by mouth every 6 (six) hours.   20 tablet   0   . fluticasone (CUTIVATE) 0.05 % cream   Topical   Apply topically 2 (two) times daily.   30 g   0   . hydrOXYzine (ATARAX/VISTARIL) 25 MG tablet   Oral   Take 1 tablet (25 mg total) by mouth every 6 (six) hours. Prn itching   20 tablet   0   . KETOCONAZOLE, TOPICAL, 1 % SHAM   Apply  externally   Apply 5 mLs topically 2 (two) times daily.   200 Bottle   0   . medroxyPROGESTERone (DEPO-PROVERA) 150 MG/ML injection   Intramuscular   Inject 1 mL (150 mg total) into the muscle every 3 (three) months.   1 mL   6   . ondansetron (ZOFRAN-ODT) 4 MG disintegrating tablet   Oral   Take 1 tablet (4 mg total) by mouth every 6 (six) hours as needed for nausea. PRN for nausea or vomiting   12 tablet   0    BP 114/77  Pulse 75  Temp(Src) 99.1 F (37.3 C) (Oral)  Resp 18  SpO2 100% Physical Exam  Nursing note and vitals reviewed. Constitutional: She is oriented to person, place, and time. She appears well-developed and well-nourished.  HENT:  Head: Normocephalic.  Right Ear: External ear normal.  Left Ear: External ear normal.  Mouth/Throat: Oropharynx is clear and moist.  Neck: Normal range of motion. Neck supple.  Pulmonary/Chest: Effort normal and breath sounds normal.  Lymphadenopathy:    She has no cervical adenopathy.  Neurological: She is alert and oriented to person,  place, and time.  Skin: Skin is warm and dry. Rash noted.  Local erythematous indurated papules on face, bilat ankles    ED Course   Procedures (including critical care time)  Labs Reviewed - No data to display No results found. 1. Multiple insect bites     MDM    Linna Hoff, MD 02/10/13 1251

## 2013-04-15 ENCOUNTER — Encounter (HOSPITAL_COMMUNITY): Payer: Self-pay | Admitting: Emergency Medicine

## 2013-04-15 ENCOUNTER — Other Ambulatory Visit (HOSPITAL_COMMUNITY)
Admission: RE | Admit: 2013-04-15 | Discharge: 2013-04-15 | Disposition: A | Discharge status: Home / Self Care | Payer: Self-pay | Source: Ambulatory Visit | Attending: Neurology | Admitting: Neurology

## 2013-04-15 ENCOUNTER — Emergency Department (INDEPENDENT_AMBULATORY_CARE_PROVIDER_SITE_OTHER)
Admission: EM | Admit: 2013-04-15 | Discharge: 2013-04-15 | Disposition: A | Discharge status: Home / Self Care | Payer: Self-pay | Source: Home / Self Care | Attending: Neurology | Admitting: Neurology

## 2013-04-15 DIAGNOSIS — N760 Acute vaginitis: Secondary | ICD-10-CM

## 2013-04-15 DIAGNOSIS — N898 Other specified noninflammatory disorders of vagina: Secondary | ICD-10-CM

## 2013-04-15 DIAGNOSIS — Z113 Encounter for screening for infections with a predominantly sexual mode of transmission: Secondary | ICD-10-CM | POA: Insufficient documentation

## 2013-04-15 LAB — POCT URINALYSIS DIP (DEVICE)
Bilirubin Urine: NEGATIVE
Glucose, UA: NEGATIVE mg/dL
Ketones, ur: NEGATIVE mg/dL
Nitrite: NEGATIVE
Specific Gravity, Urine: >=1.03 (ref 1.005–1.030)
Urobilinogen, UA: 0.2 mg/dL (ref 0.0–1.0)
pH: 5.5 (ref 5.0–8.0)

## 2013-04-15 LAB — POCT PREGNANCY, URINE: Preg Test, Ur: NEGATIVE

## 2013-04-15 MED ORDER — METRONIDAZOLE 500 MG PO TABS: 500.0000 mg | ORAL_TABLET | Freq: Two times a day (BID) | ORAL | Status: DC

## 2013-04-15 NOTE — ED Notes (Signed)
Vaginal discharge noted for 2 days, discharge is light brown.  Last depo shot was in July, no longer getting depo shot.

## 2013-04-15 NOTE — ED Provider Notes (Addendum)
CSN: [id number]     Arrival date & time 04/15/13  0865 History   First MD Initiated Contact with Patient 04/15/13 1034     Chief Complaint  Patient presents with  . Vaginal Discharge   (Consider location/radiation/quality/duration/timing/severity/associated sxs/prior Treatment) HPI Comments: 22 year old female is here for evaluation of a vaginal discharge started last night. She describes the color is light brown. She denies pelvic pain, cramping or bleeding. She is sexually active without birth control other than condoms. Since she has stopped the Depakote injections she has had some irregular spotting. Unable to obtain an accurate LMP. Denies abdominal pain, vomiting or diarrhea.    Past Medical History  Diagnosis Date  . Asthma   . UTI (lower urinary tract infection) 12/14/2010    10K E.coli on ER visit for abdominal pain  . Chlamydia 08/06/11    treated at Urgent Care  . Gonorrhea 04/20/12    treated at Urgent Care  . Migraine    History reviewed. No pertinent past surgical history. Family History  Problem Relation Age of Onset  . Hypertension Other    History  Substance Use Topics  . Smoking status: Current Some Day Smoker -- 0.30 packs/day    Types: Cigarettes  . Smokeless tobacco: Not on file  . Alcohol Use: Yes     Comment: OCCASIONAL   OB History   Grav Para Term Preterm Abortions TAB SAB Ect Mult Living   0              Review of Systems  Constitutional: Negative for fever, diaphoresis, activity change and fatigue.  HENT: Negative.   Respiratory: Negative.   Cardiovascular: Negative.   Gastrointestinal: Negative.   Genitourinary: Positive for decreased urine volume and vaginal discharge. Negative for dysuria, urgency, frequency, hematuria, flank pain, vaginal bleeding, vaginal pain and pelvic pain.  Musculoskeletal: Negative.     Allergies  Review of patient's allergies indicates no known allergies.  Home Medications   Current Outpatient Rx  Name   Route  Sig  Dispense  Refill  . EXPIRED: diphenhydrAMINE (BENADRYL) 25 MG tablet   Oral   Take 1 tablet (25 mg total) by mouth every 6 (six) hours.   20 tablet   0   . diphenhydrAMINE (BENADRYL) 25 MG tablet   Oral   Take 1 tablet (25 mg total) by mouth every 6 (six) hours.   20 tablet   0   . fluticasone (CUTIVATE) 0.05 % cream   Topical   Apply topically 2 (two) times daily.   30 g   0   . hydrOXYzine (ATARAX/VISTARIL) 25 MG tablet   Oral   Take 1 tablet (25 mg total) by mouth every 6 (six) hours. Prn itching   20 tablet   0   . KETOCONAZOLE, TOPICAL, 1 % SHAM   Apply externally   Apply 5 mLs topically 2 (two) times daily.   200 Bottle   0   . medroxyPROGESTERone (DEPO-PROVERA) 150 MG/ML injection   Intramuscular   Inject 1 mL (150 mg total) into the muscle every 3 (three) months.   1 mL   6   . metroNIDAZOLE (FLAGYL) 500 MG tablet   Oral   Take 1 tablet (500 mg total) by mouth 2 (two) times daily. X 7 days   14 tablet   0   . ondansetron (ZOFRAN-ODT) 4 MG disintegrating tablet   Oral   Take 1 tablet (4 mg total) by mouth every 6 (six) hours as needed  for nausea. PRN for nausea or vomiting   12 tablet   0    BP 116/72  Pulse 69  Temp(Src) 98.1 F (36.7 C) (Oral)  Resp 18  SpO2 97% Physical Exam  Nursing note and vitals reviewed. Constitutional: She is oriented to person, place, and time. She appears well-developed and well-nourished. No distress.  Eyes: Conjunctivae and EOM are normal.  Neck: Normal range of motion. Neck supple.  Cardiovascular: Normal rate and normal heart sounds.   Pulmonary/Chest: Effort normal. No respiratory distress.  Abdominal: Soft. Bowel sounds are normal. She exhibits no distension and no mass. There is no tenderness. There is no rebound.  Genitourinary: Vaginal discharge found.  NEFG Speculed, yellow deposit to labia, introitus Thick, light tan discharge surrounding the cervix. CX midline, pink, no erythema or lesions.  Os nonparous. No CMT or adnexal tenderness.  Musculoskeletal: She exhibits no edema.  Neurological: She is alert and oriented to person, place, and time. She exhibits normal muscle tone.  Skin: Skin is warm and dry.  Psychiatric: She has a normal mood and affect.    ED Course  Procedures (including critical care time) Labs Review Labs Reviewed  POCT URINALYSIS DIP (DEVICE) - Abnormal; Notable for the following:    Hgb urine dipstick SMALL (*)    All other components within normal limits  POCT PREGNANCY, URINE  CERVICOVAGINAL ANCILLARY ONLY   Imaging Review No results found. Results for orders placed during the hospital encounter of 04/15/13  POCT URINALYSIS DIP (DEVICE)      Result Value Range   Glucose, UA NEGATIVE  NEGATIVE mg/dL   Bilirubin Urine NEGATIVE  NEGATIVE   Ketones, ur NEGATIVE  NEGATIVE mg/dL   Specific Gravity, Urine >=1.030  1.005 - 1.030   Hgb urine dipstick SMALL (*) NEGATIVE   pH 5.5  5.0 - 8.0   Protein, ur NEGATIVE  NEGATIVE mg/dL   Urobilinogen, UA 0.2  0.0 - 1.0 mg/dL   Nitrite NEGATIVE  NEGATIVE   Leukocytes, UA NEGATIVE  NEGATIVE  POCT PREGNANCY, URINE      Result Value Range   Preg Test, Ur NEGATIVE  NEGATIVE      MDM   1. Vaginal discharge   2. Vaginitis      Suspect BV, will tx with flagyl Cervical ancillary tests obtained, pending results  Hayden Rasmussen, NP 04/15/13 1129  Test + candida. Tx wit Diflucan 150 x 2 tabs. DMabe  Hayden Rasmussen, NP 04/20/13 1702

## 2013-04-15 NOTE — ED Notes (Signed)
Returned to treatment room from bathroom

## 2013-04-17 NOTE — ED Provider Notes (Signed)
Medical screening examination/treatment/procedure(s) were performed by a resident physician or non-physician practitioner and as the supervising physician I was immediately available for consultation/collaboration.  Scarlett Portlock, MD    Denesha Brouse S Abbie Berling, MD 04/17/13 0951 

## 2013-04-18 ENCOUNTER — Encounter: Payer: Self-pay | Admitting: Family Medicine

## 2013-04-18 ENCOUNTER — Telehealth: Payer: Self-pay | Admitting: Family Medicine

## 2013-04-18 ENCOUNTER — Ambulatory Visit (INDEPENDENT_AMBULATORY_CARE_PROVIDER_SITE_OTHER): Payer: Self-pay | Admitting: Family Medicine

## 2013-04-18 VITALS — BP 102/66 | HR 73 | Temp 98.1°F | Wt 129.0 lb

## 2013-04-18 DIAGNOSIS — R3989 Other symptoms and signs involving the genitourinary system: Secondary | ICD-10-CM

## 2013-04-18 DIAGNOSIS — B373 Candidiasis of vulva and vagina: Secondary | ICD-10-CM

## 2013-04-18 DIAGNOSIS — R39198 Other difficulties with micturition: Secondary | ICD-10-CM

## 2013-04-18 LAB — RPR

## 2013-04-18 LAB — POCT URINALYSIS DIPSTICK
Glucose, UA: NEGATIVE
Nitrite, UA: POSITIVE
Spec Grav, UA: 1.02
Urobilinogen, UA: 0.2

## 2013-04-18 LAB — POCT UA - MICROSCOPIC ONLY

## 2013-04-18 MED ORDER — FLUCONAZOLE 150 MG PO TABS: 150.0000 mg | ORAL_TABLET | Freq: Once | ORAL | Status: DC

## 2013-04-18 MED ORDER — CEPHALEXIN 500 MG PO CAPS: 500.0000 mg | ORAL_CAPSULE | Freq: Four times a day (QID) | ORAL | Status: DC

## 2013-04-18 NOTE — Patient Instructions (Signed)
I suspect that you difficulty with urination is due to your yeast infection. You will be treated with Diflucan.

## 2013-04-18 NOTE — ED Notes (Signed)
GC/Chlamydia neg. Affirm: Candida pos., Gardnerella and Trich neg.  It appears that pt. got a Rx. Of Diflucan today from Dr. Randolm Idol.  Hayden Rasmussen NP notified. Vassie Moselle 04/18/2013

## 2013-04-18 NOTE — Progress Notes (Signed)
  Subjective:    Patient ID: Shari Davidson, female    DOB: 05/15/1991, 22 y.o.   MRN: [id number]  HPI 22 year old female presents for evaluation of difficulty with urination. She was recently seen and evaluated in urgent care on 04/15/2013 and diagnosed with bacterial vaginosis. She was given a prescription of Flagyl however she has yet to pick this up. She continues to have vaginal discharge. Irritation from the vaginal discharge is causing her difficulty with urination, she has incomplete voiding, she does admit to some suprapubic abdominal pain, no fevers or chills  She is currently sexually active with one female partner. Does not use condoms on a regular basis. She is interested in HIV and syphilis testing.  Review of Systems  Constitutional: Negative for chills and fatigue.  Cardiovascular: Negative for chest pain.  Gastrointestinal: Positive for abdominal pain. Negative for nausea, diarrhea and abdominal distention.  Genitourinary: Positive for dysuria, frequency, difficulty urinating and pelvic pain. Negative for genital sores.       Objective:   Physical Exam Vitals: Reviewed General: Pleasant African American female, no acute distress Cardiac: Regular in rhythm, S1 and S2 present, no murmurs, no heaves or thrills Respiratory: Clear to auscultation bilaterally, normal effort Abdomen: Bowel sounds present, no scars, tattoos on lower abdomen, mild suprapubic tenderness GU: Performed in the presence of nursing assistant, mild irritation of the external vulva and urethra noted  Reviewed vaginal cultures performed on 04/15/2013. Negative for gonorrhea and chlamydia. Negative for Trichomonas and Gardnerella. Positive for yeast.       Assessment & Plan:  Please see problem specific assessment and plan.

## 2013-04-18 NOTE — Assessment & Plan Note (Signed)
Two-day history of difficulty with urination. Likely secondary to vaginal yeast infection. Treatment provided for yeast infection as documented. Urine dipstick is repeated today and pending at time of discharge. Patient will be called with results.

## 2013-04-18 NOTE — Assessment & Plan Note (Signed)
Patient was recently seen and evaluated at urgent care and diagnosed tentatively with bacterial vaginosis. Review of cultures suggest that her cause of vaginal discharge is actually a yeast infection. -Patient to be treated with Diflucan 150 mg by mouth x1 -I suspect that her yeast infection is contributing to her difficulty with urination

## 2013-04-18 NOTE — Telephone Encounter (Signed)
Informed patient of positive urine dipstick/microscopy. Will treat empirically with Bactrim and await urine culture results.

## 2013-04-18 NOTE — Addendum Note (Signed)
Addended by: [place], Tarus Briski on: 04/18/2013 04:30 PM   Modules accepted: Orders

## 2013-04-19 LAB — HIV ANTIBODY (ROUTINE TESTING W REFLEX): HIV: NONREACTIVE

## 2013-04-20 LAB — URINE CULTURE: Colony Count: >=100000

## 2013-04-20 NOTE — ED Provider Notes (Signed)
Medical screening examination/treatment/procedure(s) were performed by a resident physician or non-physician practitioner and as the supervising physician I was immediately available for consultation/collaboration.  Clementeen Graham, MD   Rodolph Bong, MD 04/20/13 2030

## 2013-04-21 ENCOUNTER — Telehealth: Payer: Self-pay | Admitting: Family Medicine

## 2013-04-21 DIAGNOSIS — B373 Candidiasis of vulva and vagina: Secondary | ICD-10-CM

## 2013-04-21 MED ORDER — FLUCONAZOLE 150 MG PO TABS: 150.0000 mg | ORAL_TABLET | Freq: Once | ORAL | Status: DC

## 2013-04-21 NOTE — Telephone Encounter (Signed)
Discussed lab work. Still having white vaginal discharge however her difficulty with urination has improved. Will send in additional dose of Diflucan to treat yeast infection.

## 2013-04-27 ENCOUNTER — Ambulatory Visit: Payer: Self-pay

## 2013-06-18 ENCOUNTER — Encounter (HOSPITAL_COMMUNITY): Payer: Self-pay | Admitting: Emergency Medicine

## 2013-06-18 ENCOUNTER — Emergency Department (HOSPITAL_COMMUNITY)
Admission: EM | Admit: 2013-06-18 | Discharge: 2013-06-19 | Disposition: A | Discharge status: Home / Self Care | Payer: Self-pay | Attending: Emergency Medicine | Admitting: Emergency Medicine

## 2013-06-18 DIAGNOSIS — B9689 Other specified bacterial agents as the cause of diseases classified elsewhere: Secondary | ICD-10-CM | POA: Insufficient documentation

## 2013-06-18 DIAGNOSIS — N760 Acute vaginitis: Secondary | ICD-10-CM | POA: Insufficient documentation

## 2013-06-18 DIAGNOSIS — Z8679 Personal history of other diseases of the circulatory system: Secondary | ICD-10-CM | POA: Insufficient documentation

## 2013-06-18 DIAGNOSIS — J45909 Unspecified asthma, uncomplicated: Secondary | ICD-10-CM | POA: Insufficient documentation

## 2013-06-18 DIAGNOSIS — Z87440 Personal history of urinary (tract) infections: Secondary | ICD-10-CM | POA: Insufficient documentation

## 2013-06-18 DIAGNOSIS — Z8619 Personal history of other infectious and parasitic diseases: Secondary | ICD-10-CM | POA: Insufficient documentation

## 2013-06-18 DIAGNOSIS — F17200 Nicotine dependence, unspecified, uncomplicated: Secondary | ICD-10-CM | POA: Insufficient documentation

## 2013-06-18 DIAGNOSIS — Z3202 Encounter for pregnancy test, result negative: Secondary | ICD-10-CM | POA: Insufficient documentation

## 2013-06-18 DIAGNOSIS — A499 Bacterial infection, unspecified: Secondary | ICD-10-CM | POA: Insufficient documentation

## 2013-06-18 LAB — CBC WITH DIFFERENTIAL/PLATELET
Basophils Relative: 0 % (ref 0–1)
Eosinophils Relative: 5 % (ref 0–5)
Hemoglobin: 13.8 g/dL (ref 12.0–15.0)
Lymphocytes Relative: 41 % (ref 12–46)
Lymphs Abs: 1.6 10*3/uL (ref 0.7–4.0)
MCV: 93 fL (ref 78.0–100.0)
Monocytes Absolute: 0.4 10*3/uL (ref 0.1–1.0)
Monocytes Relative: 9 % (ref 3–12)
Neutrophils Relative %: 44 % (ref 43–77)
Platelets: 232 10*3/uL (ref 150–400)
RBC: 4.16 MIL/uL (ref 3.87–5.11)
WBC: 4 10*3/uL (ref 4.0–10.5)

## 2013-06-18 LAB — COMPREHENSIVE METABOLIC PANEL
ALT: 11 U/L (ref 0–35)
Albumin: 4.2 g/dL (ref 3.5–5.2)
Alkaline Phosphatase: 72 U/L (ref 39–117)
BUN: 11 mg/dL (ref 6–23)
CO2: 22 mEq/L (ref 19–32)
Chloride: 104 mEq/L (ref 96–112)
Creatinine, Ser: 0.69 mg/dL (ref 0.50–1.10)
GFR calc Af Amer: >90 mL/min (ref >90)
GFR calc non Af Amer: >90 mL/min (ref >90)
Glucose, Bld: 106 mg/dL — ABNORMAL HIGH (ref 70–99)
Potassium: 3.6 mEq/L (ref 3.5–5.1)
Sodium: 138 mEq/L (ref 135–145)
Total Bilirubin: 0.3 mg/dL (ref 0.3–1.2)

## 2013-06-18 NOTE — ED Notes (Signed)
Unable to give urine specimen at this time at triage.  

## 2013-06-18 NOTE — ED Notes (Signed)
Pt. reports intermittent vaginal bleeding for 1 1/2 months , denies abdominal pain or cramping , no fever or chills.

## 2013-06-19 LAB — URINALYSIS, ROUTINE W REFLEX MICROSCOPIC
Bilirubin Urine: NEGATIVE
Ketones, ur: NEGATIVE mg/dL
Leukocytes, UA: NEGATIVE
Nitrite: NEGATIVE
Protein, ur: NEGATIVE mg/dL
pH: 5.5 (ref 5.0–8.0)

## 2013-06-19 LAB — GC/CHLAMYDIA PROBE AMP: GC Probe RNA: NEGATIVE

## 2013-06-19 LAB — WET PREP, GENITAL: Yeast Wet Prep HPF POC: NONE SEEN

## 2013-06-19 LAB — PREGNANCY, URINE: Preg Test, Ur: NEGATIVE

## 2013-06-19 MED ORDER — METRONIDAZOLE 500 MG PO TABS: 500.0000 mg | ORAL_TABLET | Freq: Two times a day (BID) | ORAL | Status: DC

## 2013-06-19 NOTE — ED Provider Notes (Signed)
Medical screening examination/treatment/procedure(s) were performed by non-physician practitioner and as supervising physician I was immediately available for consultation/collaboration.  EKG Interpretation   None         Brandt Loosen, MD 06/19/13 [phone]

## 2013-06-19 NOTE — ED Provider Notes (Signed)
CSN: [id number]     Arrival date & time 06/18/13  2142 History   First MD Initiated Contact with Patient 06/18/13 2334     Chief Complaint  Patient presents with  . Vaginal Bleeding   (Consider location/radiation/quality/duration/timing/severity/associated sxs/prior Treatment) Patient is a 22 y.o. female presenting with vaginal bleeding. The history is provided by the patient. No language interpreter was used.  Vaginal Bleeding Quality:  Spotting Severity:  Mild Onset quality:  Gradual Duration:  4 months Timing:  Sporadic Progression:  Worsening Chronicity:  New Menstrual history:  Irregular Possible pregnancy: no   Context: after intercourse   Relieved by:  Nothing Ineffective treatments:  None tried Associated symptoms: no abdominal pain and no dysuria   Risk factors: no bleeding disorder     Past Medical History  Diagnosis Date  . Asthma   . UTI (lower urinary tract infection) 12/14/2010    10K E.coli on ER visit for abdominal pain  . Chlamydia 08/06/11    treated at Urgent Care  . Gonorrhea 04/20/12    treated at Urgent Care  . Migraine    History reviewed. No pertinent past surgical history. Family History  Problem Relation Age of Onset  . Hypertension Other    History  Substance Use Topics  . Smoking status: Current Some Day Smoker -- 0.30 packs/day    Types: Cigarettes  . Smokeless tobacco: Not on file  . Alcohol Use: Yes     Comment: OCCASIONAL   OB History   Grav Para Term Preterm Abortions TAB SAB Ect Mult Living   0              Review of Systems  Gastrointestinal: Negative for abdominal pain.  Genitourinary: Positive for vaginal bleeding. Negative for dysuria.  All other systems reviewed and are negative.    Allergies  Review of patient's allergies indicates no known allergies.  Home Medications   Current Outpatient Rx  Name  Route  Sig  Dispense  Refill  . medroxyPROGESTERone (DEPO-PROVERA) 150 MG/ML injection   Intramuscular   Inject  1 mL (150 mg total) into the muscle every 3 (three) months.   1 mL   6   . EXPIRED: diphenhydrAMINE (BENADRYL) 25 MG tablet   Oral   Take 1 tablet (25 mg total) by mouth every 6 (six) hours.   20 tablet   0    BP 128/66  Pulse 84  Temp(Src) 98.5 F (36.9 C) (Oral)  Resp 16  Wt 123 lb 11.2 oz (56.11 kg)  SpO2 100% Physical Exam  Nursing note and vitals reviewed. Constitutional: She is oriented to person, place, and time. She appears well-developed and well-nourished.  HENT:  Head: Normocephalic and atraumatic.  Mouth/Throat: Oropharynx is clear and moist.  Eyes: Conjunctivae and EOM are normal. Pupils are equal, round, and reactive to light.  Neck: Normal range of motion. Neck supple.  Cardiovascular: Normal rate.   Pulmonary/Chest: Effort normal.  Abdominal: Soft. Bowel sounds are normal. She exhibits no distension. There is no tenderness.  Genitourinary: Uterus normal. Vaginal discharge found.  Vaginal discharge,  Thick white,  Adnexa no masses,  Cervix nontender  Musculoskeletal: Normal range of motion.  Neurological: She is alert and oriented to person, place, and time.  Skin: Skin is warm.  Psychiatric: She has a normal mood and affect.    ED Course  Procedures (including critical care time) Labs Review Labs Reviewed  CBC WITH DIFFERENTIAL - Abnormal; Notable for the following:    RDW  11.2 (*)    All other components within normal limits  COMPREHENSIVE METABOLIC PANEL - Abnormal; Notable for the following:    Glucose, Bld 106 (*)    All other components within normal limits  URINALYSIS, ROUTINE W REFLEX MICROSCOPIC   Imaging Review No results found.  EKG Interpretation   None       MDM   1. BV (bacterial vaginosis)    rx for flagyl  Pt advised to follow up at [facility] for recheck.    Lonia Skinner [place], PA-C 06/19/13 [phone]

## 2013-07-09 ENCOUNTER — Encounter (HOSPITAL_COMMUNITY): Payer: Self-pay | Admitting: *Deleted

## 2013-07-09 ENCOUNTER — Inpatient Hospital Stay (HOSPITAL_COMMUNITY)
Admission: AD | Admit: 2013-07-09 | Discharge: 2013-07-09 | Disposition: A | Discharge status: Home / Self Care | Payer: Self-pay | Source: Ambulatory Visit | Attending: Obstetrics & Gynecology | Admitting: Obstetrics & Gynecology

## 2013-07-09 DIAGNOSIS — N949 Unspecified condition associated with female genital organs and menstrual cycle: Secondary | ICD-10-CM | POA: Insufficient documentation

## 2013-07-09 DIAGNOSIS — F17200 Nicotine dependence, unspecified, uncomplicated: Secondary | ICD-10-CM | POA: Insufficient documentation

## 2013-07-09 DIAGNOSIS — N938 Other specified abnormal uterine and vaginal bleeding: Secondary | ICD-10-CM | POA: Insufficient documentation

## 2013-07-09 LAB — URINALYSIS, ROUTINE W REFLEX MICROSCOPIC
Bilirubin Urine: NEGATIVE
GLUCOSE, UA: NEGATIVE mg/dL
Ketones, ur: NEGATIVE mg/dL
LEUKOCYTES UA: NEGATIVE
Nitrite: NEGATIVE
PROTEIN: NEGATIVE mg/dL
Specific Gravity, Urine: <1.005 — ABNORMAL LOW (ref 1.005–1.030)
Urobilinogen, UA: 0.2 mg/dL (ref 0.0–1.0)
pH: 5.5 (ref 5.0–8.0)

## 2013-07-09 LAB — URINE MICROSCOPIC-ADD ON

## 2013-07-09 LAB — CBC
HCT: 37.3 % (ref 36.0–46.0)
HEMOGLOBIN: 13 g/dL (ref 12.0–15.0)
MCH: 31.9 pg (ref 26.0–34.0)
MCHC: 34.9 g/dL (ref 30.0–36.0)
MCV: 91.6 fL (ref 78.0–100.0)
Platelets: 237 10*3/uL (ref 150–400)
RBC: 4.07 MIL/uL (ref 3.87–5.11)
RDW: 11.4 % — ABNORMAL LOW (ref 11.5–15.5)
WBC: 4.2 10*3/uL (ref 4.0–10.5)

## 2013-07-09 LAB — POCT PREGNANCY, URINE: PREG TEST UR: NEGATIVE

## 2013-07-09 MED ORDER — NORETHINDRONE ACET-ETHINYL EST 1-20 MG-MCG PO TABS: 1.0000 | ORAL_TABLET | Freq: Every day | ORAL | Status: DC

## 2013-07-09 NOTE — MAU Note (Signed)
Se[facility] ED [facility] ago for vag bleeding. Told to come here to figure out why i keep bleeding. Bleeding for almost 3 months. Occ sharp pains bottom of belly. Use about 3 tampons/day

## 2013-07-09 NOTE — MAU Provider Note (Signed)
History     CSN: [id number]  Arrival date and time: 07/09/13 4098   First Provider Initiated Contact with Patient 07/09/13 0138      Chief Complaint  Patient presents with  . Vaginal Bleeding   HPI  Pt is a 23 yo G0P0 seen at [facility] ED 2-3wks ago for vag bleeding. Told to come here to figure out why bleeding has continued.  Reports bleeding for almost 3 months. Use about 3 tampons/day.  Current BCM is depo provera.  Last depo injection in September 2014.  Intermittent sharp pains at lower pelvis.  Pt declines STD screen.    Past Medical History  Diagnosis Date  . Asthma   . UTI (lower urinary tract infection) 12/14/2010    10K E.coli on ER visit for abdominal pain  . Chlamydia 08/06/11    treated at Urgent Care  . Gonorrhea 04/20/12    treated at Urgent Care  . Migraine     Past Surgical History  Procedure Laterality Date  . No past surgeries      Family History  Problem Relation Age of Onset  . Hypertension Other     History  Substance Use Topics  . Smoking status: Current Some Day Smoker -- 0.30 packs/day    Types: Cigarettes  . Smokeless tobacco: Not on file  . Alcohol Use: Yes     Comment: OCCASIONAL    Allergies: No Known Allergies  Prescriptions prior to admission  Medication Sig Dispense Refill  . albuterol (PROVENTIL) (5 MG/ML) 0.5% nebulizer solution Take 2.5 mg by nebulization every 6 (six) hours as needed for wheezing or shortness of breath.      . diphenhydrAMINE (BENADRYL) 25 MG tablet Take 1 tablet (25 mg total) by mouth every 6 (six) hours.  20 tablet  0  . medroxyPROGESTERone (DEPO-PROVERA) 150 MG/ML injection Inject 1 mL (150 mg total) into the muscle every 3 (three) months.  1 mL  6  . metroNIDAZOLE (FLAGYL) 500 MG tablet Take 1 tablet (500 mg total) by mouth 2 (two) times daily.  14 tablet  0    Review of Systems  Constitutional: Positive for malaise/fatigue.  Cardiovascular: Negative for chest pain and palpitations.  Gastrointestinal:  Positive for abdominal pain ( cramping).  Genitourinary:       Irregular vaginal bleeding  Neurological: Negative for dizziness.  All other systems reviewed and are negative.   Physical Exam   Blood pressure 120/73, pulse 95, temperature 98.3 F (36.8 C), resp. rate 18, height 5\' 4"  (1.626 m), weight 54.976 kg (121 lb 3.2 oz), last menstrual period 03/26/2013, SpO2 99.00%.  Physical Exam  Constitutional: She is oriented to person, place, and time. She appears well-developed and well-nourished. No distress.  HENT:  Head: Normocephalic.  Eyes: Pupils are equal, round, and reactive to light.  Neck: Normal range of motion. Neck supple.  Cardiovascular: Normal rate, regular rhythm and normal heart sounds.   Respiratory: Effort normal and breath sounds normal. No respiratory distress.  GI: Soft. There is no tenderness.  Genitourinary: Uterus normal. Uterus is not enlarged. Cervix exhibits no motion tenderness. Right adnexum displays no mass, no tenderness and no fullness. Left adnexum displays no mass, no tenderness and no fullness. Vaginal discharge (scant bleeding) found.  Negative cervical motion tenderness  Neurological: She is alert and oriented to person, place, and time. She has normal reflexes.  Skin: Skin is warm and dry.    MAU Course  Procedures Results for orders placed during the hospital encounter  of 07/09/13 (from the past 24 hour(s))  URINALYSIS, ROUTINE W REFLEX MICROSCOPIC     Status: Abnormal   Collection Time    07/09/13  1:22 AM      Result Value Range   Color, Urine YELLOW  YELLOW   APPearance CLEAR  CLEAR   Specific Gravity, Urine <1.005 (*) 1.005 - 1.030   pH 5.5  5.0 - 8.0   Glucose, UA NEGATIVE  NEGATIVE mg/dL   Hgb urine dipstick TRACE (*) NEGATIVE   Bilirubin Urine NEGATIVE  NEGATIVE   Ketones, ur NEGATIVE  NEGATIVE mg/dL   Protein, ur NEGATIVE  NEGATIVE mg/dL   Urobilinogen, UA 0.2  0.0 - 1.0 mg/dL   Nitrite NEGATIVE  NEGATIVE   Leukocytes, UA  NEGATIVE  NEGATIVE  URINE MICROSCOPIC-ADD ON     Status: None   Collection Time    07/09/13  1:22 AM      Result Value Range   Squamous Epithelial / LPF RARE  RARE   WBC, UA 0-2  <3 WBC/hpf   Bacteria, UA RARE  RARE  POCT PREGNANCY, URINE     Status: None   Collection Time    07/09/13  1:41 AM      Result Value Range   Preg Test, Ur NEGATIVE  NEGATIVE  CBC     Status: Abnormal   Collection Time    07/09/13  1:55 AM      Result Value Range   WBC 4.2  4.0 - 10.5 K/uL   RBC 4.07  3.87 - 5.11 MIL/uL   Hemoglobin 13.0  12.0 - 15.0 g/dL   HCT 16.137.3  09.636.0 - 04.546.0 %   MCV 91.6  78.0 - 100.0 fL   MCH 31.9  26.0 - 34.0 pg   MCHC 34.9  30.0 - 36.0 g/dL   RDW 40.911.4 (*) 81.111.5 - 91.415.5 %   Platelets 237  150 - 400 K/uL    Assessment and Plan  Irregular Bleeding - Depo Provera  Plan: Discharge to home Reviewed other forms of family planning. [facility] Provided reassurance.    [facility],Ngoc Daughtridge 07/09/2013, 1:39 AM

## 2013-07-09 NOTE — Discharge Instructions (Signed)

## 2013-07-10 NOTE — MAU Provider Note (Signed)
Attestation of Attending Supervision of Advanced Practitioner (CNM/NP): Evaluation and management procedures were performed by the Advanced Practitioner under my supervision and collaboration.  I have reviewed the Advanced Practitioner's note and chart, and I agree with the management and plan.  HARRAWAY-SMITH, Darsh Vandevoort 9:09 AM

## 2013-07-11 ENCOUNTER — Encounter (HOSPITAL_COMMUNITY): Payer: Self-pay | Admitting: *Deleted

## 2013-10-28 ENCOUNTER — Encounter (HOSPITAL_COMMUNITY): Payer: Self-pay | Admitting: Emergency Medicine

## 2013-10-28 ENCOUNTER — Emergency Department (HOSPITAL_COMMUNITY)
Admission: EM | Admit: 2013-10-28 | Discharge: 2013-10-28 | Disposition: A | Discharge status: Home / Self Care | Payer: Self-pay | Attending: Emergency Medicine | Admitting: Emergency Medicine

## 2013-10-28 ENCOUNTER — Emergency Department (INDEPENDENT_AMBULATORY_CARE_PROVIDER_SITE_OTHER)
Admission: EM | Admit: 2013-10-28 | Discharge: 2013-10-28 | Disposition: A | Discharge status: Home / Self Care | Payer: Self-pay | Source: Home / Self Care

## 2013-10-28 DIAGNOSIS — Z8619 Personal history of other infectious and parasitic diseases: Secondary | ICD-10-CM | POA: Insufficient documentation

## 2013-10-28 DIAGNOSIS — F17200 Nicotine dependence, unspecified, uncomplicated: Secondary | ICD-10-CM | POA: Insufficient documentation

## 2013-10-28 DIAGNOSIS — R110 Nausea: Secondary | ICD-10-CM | POA: Insufficient documentation

## 2013-10-28 DIAGNOSIS — Z79899 Other long term (current) drug therapy: Secondary | ICD-10-CM | POA: Insufficient documentation

## 2013-10-28 DIAGNOSIS — Z3202 Encounter for pregnancy test, result negative: Secondary | ICD-10-CM | POA: Insufficient documentation

## 2013-10-28 DIAGNOSIS — Z8679 Personal history of other diseases of the circulatory system: Secondary | ICD-10-CM | POA: Insufficient documentation

## 2013-10-28 DIAGNOSIS — N390 Urinary tract infection, site not specified: Secondary | ICD-10-CM | POA: Insufficient documentation

## 2013-10-28 DIAGNOSIS — R6883 Chills (without fever): Secondary | ICD-10-CM | POA: Insufficient documentation

## 2013-10-28 DIAGNOSIS — J45909 Unspecified asthma, uncomplicated: Secondary | ICD-10-CM | POA: Insufficient documentation

## 2013-10-28 LAB — URINALYSIS, ROUTINE W REFLEX MICROSCOPIC
Glucose, UA: NEGATIVE mg/dL
Ketones, ur: 15 mg/dL — AB
NITRITE: POSITIVE — AB
PH: 5.5 (ref 5.0–8.0)
Protein, ur: 100 mg/dL — AB
SPECIFIC GRAVITY, URINE: 1.019 (ref 1.005–1.030)
UROBILINOGEN UA: 0.2 mg/dL (ref 0.0–1.0)

## 2013-10-28 LAB — POC URINE PREG, ED: PREG TEST UR: NEGATIVE

## 2013-10-28 LAB — URINE MICROSCOPIC-ADD ON

## 2013-10-28 MED ORDER — PHENAZOPYRIDINE HCL 100 MG PO TABS
200.0000 mg | ORAL_TABLET | Freq: Once | ORAL | Status: AC
  Administered 2013-10-28: 200 mg via ORAL
  Filled 2013-10-28: qty 2

## 2013-10-28 MED ORDER — CEPHALEXIN 250 MG PO CAPS
500.0000 mg | ORAL_CAPSULE | Freq: Once | ORAL | Status: AC
  Administered 2013-10-28: 500 mg via ORAL
  Filled 2013-10-28: qty 2

## 2013-10-28 MED ORDER — CEPHALEXIN 500 MG PO CAPS: 500.0000 mg | ORAL_CAPSULE | Freq: Two times a day (BID) | ORAL | Status: DC

## 2013-10-28 MED ORDER — PHENAZOPYRIDINE HCL 200 MG PO TABS: 200.0000 mg | ORAL_TABLET | Freq: Three times a day (TID) | ORAL | Status: DC | PRN

## 2013-10-28 NOTE — ED Provider Notes (Signed)
CSN: [id number]     Arrival date & time 10/28/13  1454 History  This chart was scribed for non-physician practitioner, Wynetta Emery, PA-C, working with Gwyneth Sprout, MD by Shari Heritage, ED Scribe. This patient was seen in room TR09C/TR09C and the patient's care was started at 5:14 PM.     Chief Complaint  Patient presents with  . Urinary Tract Infection    The history is provided by the patient. No language interpreter was used.    HPI Comments: Shari Davidson is a 23 y.o. female who presents to the Emergency Department complaining of constant, severe, dysuria that began this morning upon waking. She reports associated hematuria, chlils, and nausea. Patient denies flank pain, back pain, abdominal pain, vomiting, fever, rash, headaches, weakness, vaginal bleeding, vaginal discharge, or other symptoms at this time. Patient has not tried any medicines at home for symptom relief. She has a history of asthma. Patient smokes cigarettes.   Past Medical History  Diagnosis Date  . Asthma   . UTI (lower urinary tract infection) 12/14/2010    10K E.coli on ER visit for abdominal pain  . Chlamydia 08/06/11    treated at Urgent Care  . Gonorrhea 04/20/12    treated at Urgent Care  . Migraine    Past Surgical History  Procedure Laterality Date  . No past surgeries     Family History  Problem Relation Age of Onset  . Hypertension Other    History  Substance Use Topics  . Smoking status: Current Some Day Smoker -- 0.30 packs/day    Types: Cigarettes  . Smokeless tobacco: Not on file  . Alcohol Use: Yes     Comment: OCCASIONAL   OB History   Grav Para Term Preterm Abortions TAB SAB Ect Mult Living   0              Review of Systems  Constitutional: Positive for chills. Negative for fever.  Respiratory: Negative for shortness of breath.   Cardiovascular: Negative for chest pain.  Gastrointestinal: Positive for nausea and abdominal pain. Negative for vomiting and diarrhea.   Genitourinary: Positive for dysuria and hematuria. Negative for flank pain, vaginal bleeding and vaginal discharge.  Musculoskeletal: Negative for back pain.  Skin: Negative for rash.  Neurological: Negative for weakness and headaches.  All other systems reviewed and are negative.   Allergies  Review of patient's allergies indicates no known allergies.  Home Medications   Prior to Admission medications   Medication Sig Start Date End Date Taking? Authorizing Provider  albuterol (PROVENTIL) (5 MG/ML) 0.5% nebulizer solution Take 2.5 mg by nebulization every 6 (six) hours as needed for wheezing or shortness of breath.   Yes Historical Provider, MD  norethindrone-ethinyl estradiol (MICROGESTIN,JUNEL,LOESTRIN) 1-20 MG-MCG tablet Take 1 tablet by mouth daily. 07/09/13  Yes Melissa Noon, CNM  diphenhydrAMINE (BENADRYL) 25 MG tablet Take 1 tablet (25 mg total) by mouth every 6 (six) hours. 06/06/11 07/06/11  Angus Seller, PA-C   Triage Vitals: BP 130/74  Pulse 101  Temp(Src) 98.5 F (36.9 C) (Oral)  Resp 18  Ht 5\' 2"  (1.575 m)  Wt 130 lb 6.4 oz (59.149 kg)  BMI 23.84 kg/m2  SpO2 100%  LMP 10/09/2013 Physical Exam  Nursing note and vitals reviewed. Constitutional: She is oriented to person, place, and time. She appears well-developed and well-nourished. No distress.  HENT:  Head: Normocephalic.  Mouth/Throat: Oropharynx is clear and moist.  Eyes: Conjunctivae and EOM are normal.  Cardiovascular: Normal  rate, regular rhythm and intact distal pulses.   Pulmonary/Chest: Effort normal and breath sounds normal. No stridor. No respiratory distress. She has no wheezes. She has no rales. She exhibits no tenderness.  Abdominal: Soft. Bowel sounds are normal. She exhibits no distension and no mass. There is no tenderness. There is no rebound, no guarding and no CVA tenderness.  Genitourinary:  No CVA tenderness bilaterally  Musculoskeletal: Normal range of motion.  No midline spinal or  paraspinal tenderness.   Neurological: She is alert and oriented to person, place, and time.  Psychiatric: She has a normal mood and affect.    ED Course  Procedures (including critical care time) DIAGNOSTIC STUDIES: Oxygen Saturation is 100% on room air, normal by my interpretation.    COORDINATION OF CARE: 5:18 PM- UA and symptoms consistent with UTI. Will prescribe Azo and Keflex to treat and discharge to home. Patient advised of return precautions. Patient informed of current plan for treatment and evaluation and agrees with plan at this time.  Results for orders placed during the hospital encounter of 10/28/13  URINALYSIS, ROUTINE W REFLEX MICROSCOPIC      Result Value Ref Range   Color, Urine RED (*) YELLOW   APPearance TURBID (*) CLEAR   Specific Gravity, Urine 1.019  1.005 - 1.030   pH 5.5  5.0 - 8.0   Glucose, UA NEGATIVE  NEGATIVE mg/dL   Hgb urine dipstick LARGE (*) NEGATIVE   Bilirubin Urine MODERATE (*) NEGATIVE   Ketones, ur 15 (*) NEGATIVE mg/dL   Protein, ur 528100 (*) NEGATIVE mg/dL   Urobilinogen, UA 0.2  0.0 - 1.0 mg/dL   Nitrite POSITIVE (*) NEGATIVE   Leukocytes, UA LARGE (*) NEGATIVE  URINE MICROSCOPIC-ADD ON      Result Value Ref Range   WBC, UA TOO NUMEROUS TO COUNT  <3 WBC/hpf   RBC / HPF TOO NUMEROUS TO COUNT  <3 RBC/hpf   Bacteria, UA FEW (*) RARE  POC URINE PREG, ED      Result Value Ref Range   Preg Test, Ur NEGATIVE  NEGATIVE     MDM   Final diagnoses:  None    Filed Vitals:   10/28/13 1512 10/28/13 1701  BP: 130/74 129/72  Pulse: 101 85  Temp: 98.5 F (36.9 C) 98.8 F (37.1 C)  TempSrc: Oral Oral  Resp: 18 20  Height: 5\' 2"  (1.575 m)   Weight: 130 lb 6.4 oz (59.149 kg)   SpO2: 100% 93%    Medications  phenazopyridine (PYRIDIUM) tablet 200 mg (200 mg Oral Given 10/28/13 1736)  cephALEXin (KEFLEX) capsule 500 mg (500 mg Oral Given 10/28/13 1736)    Shari Davidson is a 23 y.o. female presenting with dysuria, chills, back  pain and sensation of incomplete voiding onset yesterday. Patient is afebrile, tolerating by mouth, no CVA tenderness to palpation. We'll treat for UTI. Discussed return precautions for pyelonephritis.  Evaluation does not show pathology that would require ongoing emergent intervention or inpatient treatment. Pt is hemodynamically stable and mentating appropriately. Discussed findings and plan with patient/guardian, who agrees with care plan. All questions answered. Return precautions discussed and outpatient follow up given.   Discharge Medication List as of 10/28/2013  5:22 PM    START taking these medications   Details  cephALEXin (KEFLEX) 500 MG capsule Take 1 capsule (500 mg total) by mouth 2 (two) times daily., Starting 10/28/2013, Until Discontinued, Print    phenazopyridine (PYRIDIUM) 200 MG tablet Take 1 tablet (200 mg  total) by mouth 3 (three) times daily as needed for pain., Starting 10/28/2013, Until Discontinued, Print        Note: Portions of this report may have been transcribed using voice recognition software. Every effort was made to ensure accuracy; however, inadvertent computerized transcription errors may be present  .I personally performed the services described in this documentation, which was scribed in my presence. The recorded information has been reviewed and is accurate.    Wynetta Emeryicole Collins [place], PA-C 10/29/13 1559

## 2013-10-28 NOTE — Discharge Instructions (Signed)
°  Take your antibiotics as directed and to completion. You should never have any leftover antibiotics! Push fluids and stay well hydrated.  ° °Please follow with your primary care doctor in the next 2 days for a check-up. They must obtain records for further management.  ° °Do not hesitate to return to the Emergency Department for any new, worsening or concerning symptoms.  ° °Urinary Tract Infection °Urinary tract infections (UTIs) can develop anywhere along your urinary tract. Your urinary tract is your body's drainage system for removing wastes and extra water. Your urinary tract includes two kidneys, two ureters, a bladder, and a urethra. Your kidneys are a pair of bean-shaped organs. Each kidney is about the size of your fist. They are located below your ribs, one on each side of your spine. °CAUSES °Infections are caused by microbes, which are microscopic organisms, including fungi, viruses, and bacteria. These organisms are so small that they can only be seen through a microscope. Bacteria are the microbes that most commonly cause UTIs. °SYMPTOMS  °Symptoms of UTIs may vary by age and gender of the patient and by the location of the infection. Symptoms in young women typically include a frequent and intense urge to urinate and a painful, burning feeling in the bladder or urethra during urination. Older women and men are more likely to be tired, shaky, and weak and have muscle aches and abdominal pain. A fever may mean the infection is in your kidneys. Other symptoms of a kidney infection include pain in your back or sides below the ribs, nausea, and vomiting. °DIAGNOSIS °To diagnose a UTI, your caregiver will ask you about your symptoms. Your caregiver also will ask to provide a urine sample. The urine sample will be tested for bacteria and white blood cells. White blood cells are made by your body to help fight infection. °TREATMENT  °Typically, UTIs can be treated with medication. Because most UTIs are  caused by a bacterial infection, they usually can be treated with the use of antibiotics. The choice of antibiotic and length of treatment depend on your symptoms and the type of bacteria causing your infection. °HOME CARE INSTRUCTIONS °· If you were prescribed antibiotics, take them exactly as your caregiver instructs you. Finish the medication even if you feel better after you have only taken some of the medication. °· Drink enough water and fluids to keep your urine clear or pale yellow. °· Avoid caffeine, tea, and carbonated beverages. They tend to irritate your bladder. °· Empty your bladder often. Avoid holding urine for long periods of time. °· Empty your bladder before and after sexual intercourse. °· After a bowel movement, women should cleanse from front to back. Use each tissue only once. °SEEK MEDICAL CARE IF:  °· You have back pain. °· You develop a fever. °· Your symptoms do not begin to resolve within 3 days. °SEEK IMMEDIATE MEDICAL CARE IF:  °· You have severe back pain or lower abdominal pain. °· You develop chills. °· You have nausea or vomiting. °· You have continued burning or discomfort with urination. °MAKE SURE YOU:  °· Understand these instructions. °· Will watch your condition. °· Will get help right away if you are not doing well or get worse. °Document Released: 03/25/2005 Document Revised: 12/15/2011 Document Reviewed: 07/24/2011 °[employer]® Patient Information ©2014 [employer]. ° °

## 2013-10-28 NOTE — ED Notes (Signed)
She states shes had painful urination, chills, back pain, urinary hesitancy today

## 2013-10-29 NOTE — ED Provider Notes (Signed)
Medical screening examination/treatment/procedure(s) were performed by non-physician practitioner and as supervising physician I was immediately available for consultation/collaboration.   EKG Interpretation None        Tyisha Cressy, MD 10/29/13 2329 

## 2013-12-28 ENCOUNTER — Emergency Department (HOSPITAL_COMMUNITY)
Admission: EM | Admit: 2013-12-28 | Discharge: 2013-12-28 | Disposition: A | Discharge status: Home / Self Care | Payer: Self-pay | Attending: Emergency Medicine | Admitting: Emergency Medicine

## 2013-12-28 ENCOUNTER — Encounter (HOSPITAL_COMMUNITY): Payer: Self-pay | Admitting: Emergency Medicine

## 2013-12-28 DIAGNOSIS — Z792 Long term (current) use of antibiotics: Secondary | ICD-10-CM | POA: Insufficient documentation

## 2013-12-28 DIAGNOSIS — Z3202 Encounter for pregnancy test, result negative: Secondary | ICD-10-CM | POA: Insufficient documentation

## 2013-12-28 DIAGNOSIS — Z79899 Other long term (current) drug therapy: Secondary | ICD-10-CM | POA: Insufficient documentation

## 2013-12-28 DIAGNOSIS — Z8619 Personal history of other infectious and parasitic diseases: Secondary | ICD-10-CM | POA: Insufficient documentation

## 2013-12-28 DIAGNOSIS — N390 Urinary tract infection, site not specified: Secondary | ICD-10-CM | POA: Insufficient documentation

## 2013-12-28 DIAGNOSIS — Z8679 Personal history of other diseases of the circulatory system: Secondary | ICD-10-CM | POA: Insufficient documentation

## 2013-12-28 DIAGNOSIS — F17200 Nicotine dependence, unspecified, uncomplicated: Secondary | ICD-10-CM | POA: Insufficient documentation

## 2013-12-28 DIAGNOSIS — J45909 Unspecified asthma, uncomplicated: Secondary | ICD-10-CM | POA: Insufficient documentation

## 2013-12-28 LAB — URINE MICROSCOPIC-ADD ON

## 2013-12-28 LAB — URINALYSIS, ROUTINE W REFLEX MICROSCOPIC
Glucose, UA: NEGATIVE mg/dL
Ketones, ur: 15 mg/dL — AB
Nitrite: POSITIVE — AB
Protein, ur: 100 mg/dL — AB
Specific Gravity, Urine: 1.026 (ref 1.005–1.030)
Urobilinogen, UA: 1 mg/dL (ref 0.0–1.0)
pH: 5.5 (ref 5.0–8.0)

## 2013-12-28 LAB — PREGNANCY, URINE: PREG TEST UR: NEGATIVE

## 2013-12-28 MED ORDER — SULFAMETHOXAZOLE-TRIMETHOPRIM 800-160 MG PO TABS: 1.0000 | ORAL_TABLET | Freq: Two times a day (BID) | ORAL | Status: DC

## 2013-12-28 MED ORDER — PHENAZOPYRIDINE HCL 200 MG PO TABS: 200.0000 mg | ORAL_TABLET | Freq: Three times a day (TID) | ORAL | Status: DC

## 2013-12-28 NOTE — Discharge Instructions (Signed)
Take the prescribed medication as directed. °Follow-up with your primary care physician if problems occur. °Return to the ED for new or worsening symptoms. ° °

## 2013-12-28 NOTE — ED Provider Notes (Signed)
Medical screening examination/treatment/procedure(s) were performed by non-physician practitioner and as supervising physician I was immediately available for consultation/collaboration.   EKG Interpretation None        Chrystina Naff, MD 12/28/13 2324 

## 2013-12-28 NOTE — ED Provider Notes (Signed)
CSN: [id number]     Arrival date & time 12/28/13  1905 History  This chart was scribed for non-physician practitioner, Garlon HatchetLisa M Skye Plamondon, PA-C, working with Rolan BuccoMelanie Belfi, MD, by Bronson CurbJacqueline Melvin, ED Scribe. This patient was seen in room TR11C/TR11C and the patient's care was started at 7:57 PM.     Chief Complaint  Patient presents with  . Dysuria    The history is provided by the patient. No language interpreter was used.    HPI Comments: Shari FlavinBrittany Shanetta Davidson is a 23 y.o. female who presents to the Emergency Department complaining of dysuria and decreased urine output since this morning.  She states she has been experiencing a pressure on her bladder and has been only able to produce " a few drops of urine" when she attempts to urinate. She reports she was able to urinate without any complications this morning, however, she reports her symptoms worsened as the day progressed. There is associated dysuria and chills. Patient denies fever, back pain, or hematuria. Patient has history of UTI, chlamydia, gonorrhea, and migraines.  She denies current abdominal pain, flank pain, or vaginal discharge.  Past Medical History  Diagnosis Date  . Asthma   . UTI (lower urinary tract infection) 12/14/2010    10K E.coli on ER visit for abdominal pain  . Chlamydia 08/06/11    treated at Urgent Care  . Gonorrhea 04/20/12    treated at Urgent Care  . Migraine    Past Surgical History  Procedure Laterality Date  . No past surgeries     Family History  Problem Relation Age of Onset  . Hypertension Other    History  Substance Use Topics  . Smoking status: Current Some Day Smoker -- 0.30 packs/day    Types: Cigarettes  . Smokeless tobacco: Not on file  . Alcohol Use: Yes     Comment: OCCASIONAL   OB History   Grav Para Term Preterm Abortions TAB SAB Ect Mult Living   0              Review of Systems  Constitutional: Positive for chills. Negative for fever.  Genitourinary: Positive for  dysuria. Negative for hematuria.  Musculoskeletal: Negative for back pain.  All other systems reviewed and are negative.     Allergies  Review of patient's allergies indicates no known allergies.  Home Medications   Prior to Admission medications   Medication Sig Start Date End Date Taking? Authorizing Provider  albuterol (PROVENTIL) (5 MG/ML) 0.5% nebulizer solution Take 2.5 mg by nebulization every 6 (six) hours as needed for wheezing or shortness of breath.    Historical Provider, MD  cephALEXin (KEFLEX) 500 MG capsule Take 1 capsule (500 mg total) by mouth 2 (two) times daily. 10/28/13   Nicole Pisciotta, PA-C  diphenhydrAMINE (BENADRYL) 25 MG tablet Take 1 tablet (25 mg total) by mouth every 6 (six) hours. 06/06/11 07/06/11  Phill MutterPeter S Dammen, PA-C  norethindrone-ethinyl estradiol (MICROGESTIN,JUNEL,LOESTRIN) 1-20 MG-MCG tablet Take 1 tablet by mouth daily. 07/09/13   Melissa NoonWalidah N Muhammad, CNM  phenazopyridine (PYRIDIUM) 200 MG tablet Take 1 tablet (200 mg total) by mouth 3 (three) times daily as needed for pain. 10/28/13   Nicole Pisciotta, PA-C   Triage Vitals: BP 125/83  Pulse 107  Temp(Src) 98.3 F (36.8 C) (Oral)  Resp 18  SpO2 98%  LMP 12/20/2013  Physical Exam  Nursing note and vitals reviewed. Constitutional: She is oriented to person, place, and time. She appears well-developed and well-nourished. No distress.  HENT:  Head: Normocephalic and atraumatic.  Eyes: Conjunctivae and EOM are normal.  Neck: Neck supple. No tracheal deviation present.  Cardiovascular: Normal rate.   Pulmonary/Chest: Effort normal. No respiratory distress.  Abdominal: Soft. Bowel sounds are normal. There is no tenderness. There is no guarding and no CVA tenderness.  Abdomen soft, non-distended, no peritoneal signs No CVA tenderness  Musculoskeletal: Normal range of motion.  Neurological: She is alert and oriented to person, place, and time.  Skin: Skin is warm and dry.  Psychiatric: She has a normal  mood and affect. Her behavior is normal.    ED Course  Procedures (including critical care time)  DIAGNOSTIC STUDIES: Oxygen Saturation is 98% on room air, normal by my interpretation.    COORDINATION OF CARE: At 2000 Discussed treatment plan with patient which includes Septra and Pyridium. Patient agrees.    Labs Review Labs Reviewed  URINALYSIS, ROUTINE W REFLEX MICROSCOPIC - Abnormal; Notable for the following:    APPearance TURBID (*)    Hgb urine dipstick LARGE (*)    Bilirubin Urine SMALL (*)    Ketones, ur 15 (*)    Protein, ur 100 (*)    Nitrite POSITIVE (*)    Leukocytes, UA LARGE (*)    All other components within normal limits  URINE MICROSCOPIC-ADD ON - Abnormal; Notable for the following:    Squamous Epithelial / LPF FEW (*)    Bacteria, UA FEW (*)    All other components within normal limits  PREGNANCY, URINE    Imaging Review No results found.   EKG Interpretation None      MDM   Final diagnoses:  UTI (lower urinary tract infection)   Urine nitrite +.   Uncomplicated UTI.  Will tx with bactrim and pyridium.  FU with PCP.  Discussed plan with patient, he/she acknowledged understanding and agreed with plan of care.  Return precautions given for new or worsening symptoms.  I personally performed the services described in this documentation, which was scribed in my presence. The recorded information has been reviewed and is accurate.  Garlon HatchetLisa M Akiva Josey, PA-C 12/28/13 2021

## 2013-12-28 NOTE — ED Notes (Signed)
Pt states she thinks she has a UTI, history of same. States she has to pee a lot but only a little comes out at a time.

## 2013-12-28 NOTE — ED Notes (Signed)
Pt. reports urinary retention / bladder pressure onset today , denies fever .

## 2013-12-28 NOTE — ED Notes (Signed)
Pt states that she cant urinate much at all. She says when she urinates that it is just drops but when asked to give a urine sample she filled up the whole specimen container.

## 2014-04-24 ENCOUNTER — Encounter (HOSPITAL_COMMUNITY): Payer: Self-pay | Admitting: Emergency Medicine

## 2014-04-24 ENCOUNTER — Emergency Department (HOSPITAL_COMMUNITY)
Admission: EM | Admit: 2014-04-24 | Discharge: 2014-04-24 | Disposition: A | Discharge status: Home / Self Care | Payer: Self-pay | Attending: Emergency Medicine | Admitting: Emergency Medicine

## 2014-04-24 DIAGNOSIS — Z8619 Personal history of other infectious and parasitic diseases: Secondary | ICD-10-CM | POA: Insufficient documentation

## 2014-04-24 DIAGNOSIS — J45909 Unspecified asthma, uncomplicated: Secondary | ICD-10-CM | POA: Insufficient documentation

## 2014-04-24 DIAGNOSIS — Z202 Contact with and (suspected) exposure to infections with a predominantly sexual mode of transmission: Secondary | ICD-10-CM | POA: Insufficient documentation

## 2014-04-24 DIAGNOSIS — Z87440 Personal history of urinary (tract) infections: Secondary | ICD-10-CM | POA: Insufficient documentation

## 2014-04-24 DIAGNOSIS — Z720 Tobacco use: Secondary | ICD-10-CM | POA: Insufficient documentation

## 2014-04-24 DIAGNOSIS — Z3202 Encounter for pregnancy test, result negative: Secondary | ICD-10-CM | POA: Insufficient documentation

## 2014-04-24 DIAGNOSIS — Z8679 Personal history of other diseases of the circulatory system: Secondary | ICD-10-CM | POA: Insufficient documentation

## 2014-04-24 DIAGNOSIS — Z792 Long term (current) use of antibiotics: Secondary | ICD-10-CM | POA: Insufficient documentation

## 2014-04-24 DIAGNOSIS — Z79899 Other long term (current) drug therapy: Secondary | ICD-10-CM | POA: Insufficient documentation

## 2014-04-24 LAB — URINALYSIS, ROUTINE W REFLEX MICROSCOPIC
Bilirubin Urine: NEGATIVE
Glucose, UA: NEGATIVE mg/dL
HGB URINE DIPSTICK: NEGATIVE
Ketones, ur: 15 mg/dL — AB
NITRITE: NEGATIVE
PROTEIN: NEGATIVE mg/dL
SPECIFIC GRAVITY, URINE: 1.026 (ref 1.005–1.030)
UROBILINOGEN UA: 0.2 mg/dL (ref 0.0–1.0)
pH: 5.5 (ref 5.0–8.0)

## 2014-04-24 LAB — URINE MICROSCOPIC-ADD ON

## 2014-04-24 LAB — WET PREP, GENITAL
Clue Cells Wet Prep HPF POC: NONE SEEN
TRICH WET PREP: NONE SEEN
YEAST WET PREP: NONE SEEN

## 2014-04-24 LAB — PREGNANCY, URINE: PREG TEST UR: NEGATIVE

## 2014-04-24 MED ORDER — CEFTRIAXONE SODIUM 250 MG IJ SOLR
250.0000 mg | Freq: Once | INTRAMUSCULAR | Status: AC
  Administered 2014-04-24: 250 mg via INTRAMUSCULAR
  Filled 2014-04-24: qty 250

## 2014-04-24 MED ORDER — AZITHROMYCIN 250 MG PO TABS
1000.0000 mg | ORAL_TABLET | Freq: Once | ORAL | Status: AC
  Administered 2014-04-24: 1000 mg via ORAL
  Filled 2014-04-24: qty 4

## 2014-04-24 NOTE — ED Provider Notes (Signed)
CSN: [id number]     Arrival date & time 04/24/14  1544 History   First MD Initiated Contact with Patient 04/24/14 1930     Chief Complaint  Patient presents with  . Vaginal Discharge     (Consider location/radiation/quality/duration/timing/severity/associated sxs/prior Treatment) HPI Comments: The patient is a 23 year old female presents to urgency Department chief complaint of abnormal vaginal discharge for one week. Patient reports white discharge. Patient reports one female sexual partner with intermittent condom use. She also reports mild dysuria denies hematuria, flank pain, fever, chills. Patient denies abdominal pain. Patient's last menstrual period was 03/11/2014.  The history is provided by the patient. No language interpreter was used.    Past Medical History  Diagnosis Date  . Asthma   . UTI (lower urinary tract infection) 12/14/2010    10K E.coli on ER visit for abdominal pain  . Chlamydia 08/06/11    treated at Urgent Care  . Gonorrhea 04/20/12    treated at Urgent Care  . Migraine    Past Surgical History  Procedure Laterality Date  . No past surgeries     Family History  Problem Relation Age of Onset  . Hypertension Other    History  Substance Use Topics  . Smoking status: Current Some Day Smoker -- 0.30 packs/day    Types: Cigarettes  . Smokeless tobacco: Not on file  . Alcohol Use: Yes     Comment: OCCASIONAL   OB History   Grav Para Term Preterm Abortions TAB SAB Ect Mult Living   0              Review of Systems  Constitutional: Negative for fever and chills.  Gastrointestinal: Negative for abdominal distention.  Genitourinary: Positive for dysuria and vaginal discharge. Negative for hematuria, flank pain, vaginal bleeding, genital sores and pelvic pain.      Allergies  Review of patient's allergies indicates no known allergies.  Home Medications   Prior to Admission medications   Medication Sig Start Date End Date Taking? Authorizing  Provider  albuterol (PROVENTIL) (5 MG/ML) 0.5% nebulizer solution Take 2.5 mg by nebulization every 6 (six) hours as needed for wheezing or shortness of breath.    Historical Provider, MD  cephALEXin (KEFLEX) 500 MG capsule Take 1 capsule (500 mg total) by mouth 2 (two) times daily. 10/28/13   Nicole Pisciotta, PA-C  diphenhydrAMINE (BENADRYL) 25 MG tablet Take 1 tablet (25 mg total) by mouth every 6 (six) hours. 06/06/11 07/06/11  Phill MutterPeter S Dammen, PA-C  norethindrone-ethinyl estradiol (MICROGESTIN,JUNEL,LOESTRIN) 1-20 MG-MCG tablet Take 1 tablet by mouth daily. 07/09/13   Marlis EdelsonWalidah N Karim, CNM  phenazopyridine (PYRIDIUM) 200 MG tablet Take 1 tablet (200 mg total) by mouth 3 (three) times daily as needed for pain. 10/28/13   Nicole Pisciotta, PA-C  phenazopyridine (PYRIDIUM) 200 MG tablet Take 1 tablet (200 mg total) by mouth 3 (three) times daily. 12/28/13   Garlon HatchetLisa M Sanders, PA-C  sulfamethoxazole-trimethoprim (SEPTRA DS) 800-160 MG per tablet Take 1 tablet by mouth 2 (two) times daily. 12/28/13   Garlon HatchetLisa M Sanders, PA-C   BP 112/59  Pulse 66  Temp(Src) 98.6 F (37 C) (Oral)  Resp 18  SpO2 100%  LMP 03/11/2014 Physical Exam  Nursing note and vitals reviewed. Constitutional: She is oriented to person, place, and time. She appears well-developed and well-nourished. No distress.  HENT:  Head: Normocephalic and atraumatic.  Neck: Neck supple.  Pulmonary/Chest: Effort normal. No respiratory distress.  Abdominal: Soft. There is no tenderness. There is  no rebound.  Genitourinary: Tenderness: .lpchap. Uterus is not tender. Cervix exhibits no motion tenderness and no friability. Right adnexum displays no mass and no tenderness. Left adnexum displays no mass and no tenderness.  Minimal amount of white discharge in the posterior vaginal vault. Chaperone present.    Neurological: She is alert and oriented to person, place, and time.  Skin: Skin is warm and dry.  Psychiatric: She has a normal mood and affect. Her  behavior is normal.    ED Course  Procedures (including critical care time) Labs Review Labs Reviewed  WET PREP, GENITAL - Abnormal; Notable for the following:    WBC, Wet Prep HPF POC FEW (*)    All other components within normal limits  URINALYSIS, ROUTINE W REFLEX MICROSCOPIC - Abnormal; Notable for the following:    APPearance CLOUDY (*)    Ketones, ur 15 (*)    Leukocytes, UA LARGE (*)    All other components within normal limits  URINE MICROSCOPIC-ADD ON - Abnormal; Notable for the following:    Squamous Epithelial / LPF MANY (*)    Bacteria, UA FEW (*)    All other components within normal limits  GC/CHLAMYDIA PROBE AMP  URINE CULTURE  PREGNANCY, URINE    Imaging Review No results found.   EKG Interpretation None      MDM   Final diagnoses:  Possible exposure to STD   Patient presents with abnormal vaginal discharge, no cervical motion tenderness on exam, patient requests be treated for gonorrhea, chlamydia at this time. UA without obvious infection, contaminated specimen, culture sent. Discussed lab results, and treatment plan with the patient. Return precautions given. Reports understanding and no other concerns at this time.  Patient is stable for discharge at this time. Meds given in ED:  Medications  azithromycin (ZITHROMAX) tablet 1,000 mg (1,000 mg Oral Given 04/24/14 2058)  cefTRIAXone (ROCEPHIN) injection 250 mg (250 mg Intramuscular Given 04/24/14 2057)    Discharge Medication List as of 04/24/2014  9:19 PM         Mellody DrownLauren Kaydra Borgen, PA-C 04/25/14 [id number]

## 2014-04-24 NOTE — ED Notes (Addendum)
Pt reports white vaginal discharge for about a week, last intercourse was two weeks ago. Does report burning when she voids.

## 2014-04-24 NOTE — ED Notes (Signed)
Pt reports decrease in urinary output, denies any pain with urination but is now having white vaginal discharge with foul odor.

## 2014-04-24 NOTE — Discharge Instructions (Signed)
You have been treated in the emergency department for an infection, possibly sexually transmitted. Results of your gonorrhea and chlamydia tests are pending and you will be notified if they are positive. It is very important to practice safe sex and use condoms when sexually active. If your results are positive you need to notify all sexual partners so they can be treated as well. The website http://www.dontspreadit.com/ can be used to send anonymous text messages or emails to alert sexual contacts. Follow up with your doctor, or OBGYN in regards to today's visit.   ° °Gonorrhea and Chlamydia °SYMPTOMS  °In females, symptoms may go unnoticed. Symptoms that are more noticeable can include:  °Belly (abdominal) pain.  °Painful intercourse.  °Watery mucous-like discharge from the vagina.  °Miscarriage.  °Discomfort when urinating.  °Inflammation of the rectum.  °Abnormal gray-green frothy vaginal discharge  °Vaginal itching and irritatio  °Itching and irritation of the area outside the vagina.   °Painful urination.  °Bleeding after sexual intercourse.  °In males, symptoms include:  °Burning with urination.  °Pain in the testicles.  °Watery mucous-like discharge from the penis.  °It can cause longstanding (chronic) pelvic pain after frequent infections.  °TREATMENT  °PID can cause women to not be able to have children (sterile) if left untreated or if half-treated.  It is important to finish ALL medications given to you.  °This is a sexually transmitted infection. So you are also at risk for other sexually transmitted diseases, including HIV (AIDS), it is recommended that you get tested. °HOME CARE INSTRUCTIONS  °Warning: This infection is contagious. Do not have sex until treatment is completed. Follow up at your caregiver's office or the clinic to which you were referred. If your diagnosis (learning what is wrong) is confirmed by culture or some other method, your recent sexual contacts need treatment. Even if they are  symptom free or have a negative culture or evaluation, they should be treated.  °PREVENTION  °Women should use sanitary pads instead of tampons for vaginal discharge.  °Wipe front to back after using the toilet and avoid douching.   °Practice safe sex, use condoms, have only one sex partner and be sure your sex partner is not having sex with others.  °Ask your caregiver to test you for chlamydia at your regular checkups or sooner if you are having symptoms.  °Ask for further information if you are pregnant.  °SEEK IMMEDIATE MEDICAL CARE IF:  °You develop an oral temperature above 102° F (38.9° C), not controlled by medications or lasting more than 2 days.  °You develop an increase in pain.  °You develop any type of abnormal discharge.  °You develop vaginal bleeding and it is not time for your period.  °You develop painful intercourse.  ° °Bacterial Vaginosis  °Bacterial vaginosis (BV) is a vaginal infection where the normal balance of bacteria in the vagina is disrupted. This is not a sexually transmitted disease and your sexual partners do NOT need to be treated. °CAUSES  °The cause of BV is not fully understood. BV develops when there is an increase or imbalance of harmful bacteria.  °Some activities or behaviors can upset the normal balance of bacteria in the vagina and put women at increased risk including:  °Having a new sex partner or multiple sex partners.  °Douching.  °Using an intrauterine device (IUD) for contraception.  °It is not clear what role sexual activity plays in the development of BV. However, women that have never had sexual intercourse are rarely   infected with BV.  °Women do not get BV from toilet seats, bedding, swimming pools or from touching objects around them.  ° °SYMPTOMS  °Grey vaginal discharge.  °A fish-like odor with discharge, especially after sexual intercourse.  °Itching or burning of the vagina and vulva.  °Burning or pain with urination.  °Some women have no signs or symptoms at  all.  ° °TREATMENT  °Sometimes BV will clear up without treatment.  °BV may be treated with antibiotics.  °BV can recur after treatment. If this happens, a second round of antibiotics will often be prescribed.  °HOME CARE INSTRUCTIONS  °Finish all medication as directed by your caregiver.  °Do not have sex until treatment is completed.  °Do NOT drink any alcoholic beverages while being treated  with Metronidazole (Flagyl). This will cause a severe reaction inducing vomiting. ° °RESOURCE GUIDE ° °Dental Problems ° °Patients with Medicaid: °[facility]                     [facility] °[address]                                           [address] °Phone:  [phone]                                                  Phone:  [phone] ° °If unable to pay or uninsured, contact:  [facility] or [facility] to become qualified for the adult dental clinic. ° °Chronic Pain Problems °Contact [facility]  [phone] °Patients need to be referred by their primary care doctor. ° °Insufficient Money for Medicine °Contact [employer]:  call "211" or [facility] 271-5999. ° °No Primary Care Doctor °Call [facility]  832-8000 °Other agencies that provide inexpensive medical care °   [facility]  832-8035 °   [facility]  832-7272 °   [facility]  [phone] °   [facility]  832-4777 °   [facility]  [phone] °   [facility]  [phone] ° °Psychological Services °[facility]  [phone] °[facility]  [phone] °[facility]   [phone] (emergency services 641-4993) ° °Substance Abuse Resources °[facility]  336-882-2125 °[facility] 336-784-9470 °[facility] 336-285-9073 °[facility] 336-845-3988 °Residential & Outpatient Substance Abuse Program  800-659-3381 ° °Abuse/Neglect °[place] [facility] (336)  641-3795 °[place] [facility] Hot[facility] 800-378-5315 (After Hours) ° °Emergency Shelter °[facility] (336) 271-5985 ° °Maternity Homes °[facility] (336) 275-9566 °[facility] (704) 372-4663 ° °MRSA Hotline #:   832-7006 ° ° ° °[place] [facility] ° °[facility]     [employer]                          [facility] °[address] [place]                       [address]      [address]  °  [place]                                                [place]                            [place] °Phone:  349-3220                                   Phone:  [phone]                 Phone:  342-8140 ° °[facility] °Phone:  342-8316 ° °[place] [facility] Hotline °(336) 342-1394 °(336) 342-3537 (After Hours) ° °

## 2014-04-25 LAB — GC/CHLAMYDIA PROBE AMP
CT Probe RNA: NEGATIVE
GC Probe RNA: NEGATIVE

## 2014-04-25 NOTE — ED Provider Notes (Signed)
Medical screening examination/treatment/procedure(s) were performed by non-physician practitioner and as supervising physician I was immediately available for consultation/collaboration.    Del Overfelt, MD 04/25/14 1522 

## 2014-04-26 LAB — URINE CULTURE: Colony Count: 50000

## 2014-05-02 ENCOUNTER — Telehealth (HOSPITAL_BASED_OUTPATIENT_CLINIC_OR_DEPARTMENT_OTHER): Payer: Self-pay | Admitting: Emergency Medicine

## 2014-05-12 ENCOUNTER — Encounter (HOSPITAL_COMMUNITY): Payer: Self-pay

## 2014-05-12 ENCOUNTER — Inpatient Hospital Stay (HOSPITAL_COMMUNITY)
Admission: AD | Admit: 2014-05-12 | Discharge: 2014-05-12 | Disposition: A | Discharge status: Home / Self Care | Payer: Medicaid Other | Source: Ambulatory Visit | Attending: Obstetrics & Gynecology | Admitting: Obstetrics & Gynecology

## 2014-05-12 DIAGNOSIS — Z720 Tobacco use: Secondary | ICD-10-CM | POA: Insufficient documentation

## 2014-05-12 DIAGNOSIS — N946 Dysmenorrhea, unspecified: Secondary | ICD-10-CM | POA: Insufficient documentation

## 2014-05-12 LAB — URINALYSIS, ROUTINE W REFLEX MICROSCOPIC
Bilirubin Urine: NEGATIVE
Glucose, UA: NEGATIVE mg/dL
Ketones, ur: NEGATIVE mg/dL
Nitrite: NEGATIVE
Protein, ur: NEGATIVE mg/dL
Specific Gravity, Urine: 1.015 (ref 1.005–1.030)
UROBILINOGEN UA: 0.2 mg/dL (ref 0.0–1.0)
pH: 7 (ref 5.0–8.0)

## 2014-05-12 LAB — URINE MICROSCOPIC-ADD ON

## 2014-05-12 LAB — POCT PREGNANCY, URINE: PREG TEST UR: NEGATIVE

## 2014-05-12 MED ORDER — IBUPROFEN 200 MG PO TABS: 800.0000 mg | ORAL_TABLET | Freq: Four times a day (QID) | ORAL | Status: DC | PRN

## 2014-05-12 NOTE — Discharge Instructions (Signed)

## 2014-05-12 NOTE — MAU Note (Signed)
Started bleeding yesterday morning, got heavy last night.  Changing super tampons <1hr.  Cramping bad.

## 2014-05-12 NOTE — MAU Provider Note (Signed)
History     CSN: [id number]  Arrival date and time: 05/12/14 1843   First Provider Initiated Contact with Patient 05/12/14 1925      Chief Complaint  Patient presents with  . Abdominal Pain  . Vaginal Bleeding   HPI 23 y.o. G0P0 with vaginal bleeding and cramping since yesterday. States her period usually comes on the 6th, did not have period when expected this month, then started having bleeding and cramping yesterday (about 1 week late). Has not tried any pain medication for the cramping.   Past Medical History  Diagnosis Date  . Asthma   . UTI (lower urinary tract infection) 12/14/2010    10K E.coli on ER visit for abdominal pain  . Chlamydia 08/06/11    treated at Urgent Care  . Gonorrhea 04/20/12    treated at Urgent Care  . Migraine     Past Surgical History  Procedure Laterality Date  . No past surgeries      Family History  Problem Relation Age of Onset  . Hypertension Other     History  Substance Use Topics  . Smoking status: Current Some Day Smoker -- 0.30 packs/day    Types: Cigarettes  . Smokeless tobacco: Not on file  . Alcohol Use: Yes     Comment: OCCASIONAL    Allergies: No Known Allergies  Prescriptions prior to admission  Medication Sig Dispense Refill Last Dose  . albuterol (PROVENTIL) (5 MG/ML) 0.5% nebulizer solution Take 2.5 mg by nebulization every 6 (six) hours as needed for wheezing or shortness of breath.   Unk  . diphenhydrAMINE (BENADRYL) 25 MG tablet Take 1 tablet (25 mg total) by mouth every 6 (six) hours. 20 tablet 0     Review of Systems  Constitutional: Negative.   Respiratory: Negative.   Cardiovascular: Negative.   Gastrointestinal: Negative for nausea, vomiting, abdominal pain, diarrhea and constipation.  Genitourinary: Negative for dysuria, urgency, frequency, hematuria and flank pain.       + bleeding & cramping  Musculoskeletal: Negative.   Neurological: Negative.   Psychiatric/Behavioral: Negative.    Physical  Exam   Blood pressure 118/63, pulse 67, temperature 98.6 F (37 C), temperature source Oral, resp. rate 16, height 5\' 3"  (1.6 m), weight 119 lb (53.978 kg), last menstrual period 05/11/2014.  Physical Exam  Nursing note and vitals reviewed. Constitutional: She is oriented to person, place, and time. She appears well-developed and well-nourished. No distress.  Cardiovascular: Normal rate.   Respiratory: Effort normal.  GI: Soft. There is no tenderness. There is no rebound and no guarding.  Genitourinary: There is no rash, tenderness or lesion on the right labia. There is no rash, tenderness or lesion on the left labia. Uterus is not deviated, not enlarged, not fixed and not tender. Cervix exhibits no motion tenderness, no discharge and no friability. Right adnexum displays no mass, no tenderness and no fullness. Left adnexum displays no mass, no tenderness and no fullness. There is bleeding (moderate) in the vagina. No erythema or tenderness in the vagina. No vaginal discharge found.  Musculoskeletal: Normal range of motion.  Neurological: She is alert and oriented to person, place, and time.  Skin: Skin is warm and dry.  Psychiatric: She has a normal mood and affect.    MAU Course  Procedures Results for orders placed or performed during the hospital encounter of 05/12/14 (from the past 24 hour(s))  Pregnancy, urine POC     Status: None   Collection Time: 05/12/14  7:01  PM  Result Value Ref Range   Preg Test, Ur NEGATIVE NEGATIVE     Assessment and Plan   1. Dysmenorrhea   Mild, offered Motrin in MAU, pt states she will take at home. Discussed normal menses, birth control options, pt will f/u outpatient.     Medication List    STOP taking these medications        diphenhydrAMINE 25 MG tablet  Commonly known as:  BENADRYL      TAKE these medications        albuterol (5 MG/ML) 0.5% nebulizer solution  Commonly known as:  PROVENTIL  Take 2.5 mg by nebulization every 6 (six)  hours as needed for wheezing or shortness of breath.     ibuprofen 200 MG tablet  Commonly known as:  MOTRIN IB  Take 4 tablets (800 mg total) by mouth every 6 (six) hours as needed.        Follow-up Information    [facility] with [facility].   Why:  As needed   Contact information:   [address], [place], [place] [address] [phone]        [facility] HospitalFRAZIER,Andreyah Natividad 05/12/2014, 7:34 PM

## 2014-06-25 ENCOUNTER — Inpatient Hospital Stay (HOSPITAL_COMMUNITY)
Admission: AD | Admit: 2014-06-25 | Discharge: 2014-06-25 | Discharge status: Against Medical Advice | Payer: Medicaid Other | Source: Ambulatory Visit | Attending: Family Medicine | Admitting: Family Medicine

## 2014-06-25 NOTE — MAU Note (Signed)
Pt not in lobby. Third call.

## 2014-06-25 NOTE — MAU Note (Signed)
Not in lobby

## 2014-06-25 NOTE — MAU Note (Signed)
Not in lobby #2 

## 2014-09-25 ENCOUNTER — Inpatient Hospital Stay (HOSPITAL_COMMUNITY)
Admission: AD | Admit: 2014-09-25 | Discharge: 2014-09-25 | Disposition: A | Discharge status: Home / Self Care | Payer: Self-pay | Source: Ambulatory Visit | Attending: Family Medicine | Admitting: Family Medicine

## 2014-09-25 ENCOUNTER — Encounter (HOSPITAL_COMMUNITY): Payer: Self-pay | Admitting: *Deleted

## 2014-09-25 DIAGNOSIS — O99331 Smoking (tobacco) complicating pregnancy, first trimester: Secondary | ICD-10-CM | POA: Insufficient documentation

## 2014-09-25 DIAGNOSIS — Z3A01 Less than 8 weeks gestation of pregnancy: Secondary | ICD-10-CM | POA: Insufficient documentation

## 2014-09-25 DIAGNOSIS — F17210 Nicotine dependence, cigarettes, uncomplicated: Secondary | ICD-10-CM | POA: Insufficient documentation

## 2014-09-25 DIAGNOSIS — O26811 Pregnancy related exhaustion and fatigue, first trimester: Secondary | ICD-10-CM | POA: Insufficient documentation

## 2014-09-25 DIAGNOSIS — Z3201 Encounter for pregnancy test, result positive: Secondary | ICD-10-CM

## 2014-09-25 LAB — POCT PREGNANCY, URINE: Preg Test, Ur: POSITIVE — AB

## 2014-09-25 MED ORDER — CVS PRENATAL GUMMY 0.4-113.5 MG PO CHEW
1.0000 | CHEWABLE_TABLET | Freq: Every day | ORAL | Status: DC
Start: 2014-09-25 — End: 2015-03-22

## 2014-09-25 NOTE — MAU Provider Note (Signed)
Chief Complaint: Possible Pregnancy   First Provider Initiated Contact with Patient 09/25/14 1330     SUBJECTIVE HPI: Shari Davidson is a 24 y.o. G1P0 at [redacted]w[redacted]d by LMP who presents to [facility] reporting pos home UPT 2 yesterday. Denies vaginal bleeding, abdominal pain, vaginal discharge. Does not have an OB/GYN. Needs pregnancy verification letter.  Past Medical History  Diagnosis Date  . Asthma   . UTI (lower urinary tract infection) 12/14/2010    10K E.coli on ER visit for abdominal pain  . Chlamydia 08/06/11    treated at Urgent Care  . Gonorrhea 04/20/12    treated at Urgent Care  . Migraine    OB History  Gravida Para Term Preterm AB SAB TAB Ectopic Multiple Living  1             # Outcome Date GA Lbr Len/2nd Weight Sex Delivery Anes PTL Lv  1 Current              Past Surgical History  Procedure Laterality Date  . No past surgeries     History   Social History  . Marital Status: Single    Spouse Name: N/A  . Number of Children: N/A  . Years of Education: N/A   Occupational History  . Not on file.   Social History Main Topics  . Smoking status: Current Some Day Smoker -- 0.30 packs/day    Types: Cigarettes  . Smokeless tobacco: Not on file  . Alcohol Use: Yes     Comment: OCCASIONAL  . Drug Use: No  . Sexual Activity: Yes    Birth Control/ Protection: None   Other Topics Concern  . Not on file   Social History Narrative    Lives with mother and 2 sisters   No current facility-administered medications on file prior to encounter.   Current Outpatient Prescriptions on File Prior to Encounter  Medication Sig Dispense Refill  . albuterol (PROVENTIL) (5 MG/ML) 0.5% nebulizer solution Take 2.5 mg by nebulization every 6 (six) hours as needed for wheezing or shortness of breath.    Marland Kitchen ibuprofen (MOTRIN IB) 200 MG tablet Take 4 tablets (800 mg total) by mouth every 6 (six) hours as needed. 30 tablet 0   No Known Allergies  Review of  Systems  Constitutional: Positive for malaise/fatigue. Negative for fever and chills.  Gastrointestinal: Negative for abdominal pain.  Genitourinary:       Negative for vaginal bleeding or vaginal discharge.  Neurological: Negative for dizziness.    OBJECTIVE Blood pressure 117/55, pulse 67, temperature 98.8 F (37.1 C), temperature source Oral, resp. rate 18, height  (1.626 m), weight 117 lb (53.071 kg), last menstrual period 08/21/2014, SpO2 100 %. GENERAL: Well-developed, well-nourished female in no acute distress.  HEART: normal rate RESP: normal effort NEURO: Alert and oriented SPECULUM EXAM: Deferred  LAB RESULTS Results for orders placed or performed during the hospital encounter of 09/25/14 (from the past 24 hour(s))  Pregnancy, urine POC     Status: Abnormal   Collection Time: 09/25/14  1:15 PM  Result Value Ref Range   Preg Test, Ur POSITIVE (A) NEGATIVE    IMAGING No results found.  MAU COURSE  ASSESSMENT 1. Positive pregnancy test   2. Fatigue during pregnancy in first trimester, antepartum    PLAN Discharge home in stable condition. Pregnancy verification letter given.  List of providers given. Follow-up Information    Follow up with OB/GYN of your choice.   Why:  Start prenatal care      [facility]   Why:  As needed in emergencies   Contact information:   [address] [id number] [place] [id number] [phone]        Medication List    TAKE these medications        albuterol (5 MG/ML) 0.5% nebulizer solution  Commonly known as:  PROVENTIL  Take 2.5 mg by nebulization every 6 (six) hours as needed for wheezing or shortness of breath.     [employer] PRENATAL GUMMY 0.4-113.5 MG Chew  Chew 1 tablet by mouth daily.     ibuprofen 200 MG tablet  Commonly known as:  MOTRIN IB  Take 4 tablets (800 mg total) by mouth every 6 (six) hours as needed.        [place] Jeremiyah Cullens, CNM 09/25/2014  2:42 PM

## 2014-09-25 NOTE — MAU Note (Signed)
Urine in lab 

## 2014-09-25 NOTE — MAU Note (Signed)
Wanted to know if we could do a preg test.  Took 2 yesterday, they were both positive and she still hasn't come on her period.

## 2014-09-25 NOTE — Discharge Instructions (Signed)
Shari Davidson verification and list of providers given.

## 2014-09-30 ENCOUNTER — Inpatient Hospital Stay (HOSPITAL_COMMUNITY)
Admission: AD | Admit: 2014-09-30 | Discharge: 2014-09-30 | Disposition: A | Discharge status: Home / Self Care | Payer: Self-pay | Source: Ambulatory Visit | Attending: Obstetrics & Gynecology | Admitting: Obstetrics & Gynecology

## 2014-09-30 ENCOUNTER — Inpatient Hospital Stay (HOSPITAL_COMMUNITY): Payer: Self-pay

## 2014-09-30 ENCOUNTER — Encounter (HOSPITAL_COMMUNITY): Payer: Self-pay

## 2014-09-30 DIAGNOSIS — Z3A01 Less than 8 weeks gestation of pregnancy: Secondary | ICD-10-CM | POA: Insufficient documentation

## 2014-09-30 DIAGNOSIS — Z87891 Personal history of nicotine dependence: Secondary | ICD-10-CM | POA: Insufficient documentation

## 2014-09-30 DIAGNOSIS — O200 Threatened abortion: Secondary | ICD-10-CM | POA: Insufficient documentation

## 2014-09-30 DIAGNOSIS — O209 Hemorrhage in early pregnancy, unspecified: Secondary | ICD-10-CM

## 2014-09-30 LAB — URINALYSIS, ROUTINE W REFLEX MICROSCOPIC
BILIRUBIN URINE: NEGATIVE
GLUCOSE, UA: NEGATIVE mg/dL
Ketones, ur: NEGATIVE mg/dL
Leukocytes, UA: NEGATIVE
Nitrite: NEGATIVE
PROTEIN: NEGATIVE mg/dL
Specific Gravity, Urine: 1.015 (ref 1.005–1.030)
Urobilinogen, UA: 0.2 mg/dL (ref 0.0–1.0)
pH: 7 (ref 5.0–8.0)

## 2014-09-30 LAB — CBC
HCT: 35.6 % — ABNORMAL LOW (ref 36.0–46.0)
Hemoglobin: 12.2 g/dL (ref 12.0–15.0)
MCH: 32.4 pg (ref 26.0–34.0)
MCHC: 34.3 g/dL (ref 30.0–36.0)
MCV: 94.4 fL (ref 78.0–100.0)
Platelets: 210 10*3/uL (ref 150–400)
RBC: 3.77 MIL/uL — ABNORMAL LOW (ref 3.87–5.11)
RDW: 11.6 % (ref 11.5–15.5)
WBC: 2.9 10*3/uL — ABNORMAL LOW (ref 4.0–10.5)

## 2014-09-30 LAB — WET PREP, GENITAL
Clue Cells Wet Prep HPF POC: NONE SEEN
Trich, Wet Prep: NONE SEEN
WBC, Wet Prep HPF POC: NONE SEEN
Yeast Wet Prep HPF POC: NONE SEEN

## 2014-09-30 LAB — ABO/RH: ABO/RH(D): O POS

## 2014-09-30 LAB — URINE MICROSCOPIC-ADD ON

## 2014-09-30 LAB — HCG, QUANTITATIVE, PREGNANCY: hCG, Beta Chain, Quant, S: 180 m[IU]/mL — ABNORMAL HIGH (ref <5)

## 2014-09-30 NOTE — MAU Provider Note (Signed)
History     CSN: [id number]  Arrival date and time: 09/30/14 1159   First Provider Initiated Contact with Patient 09/30/14 1221      Chief Complaint  Patient presents with  . Vaginal Bleeding   HPI Shari Davidson 24 y.o. G1P0 @[redacted]w[redacted]d  [facility] that started this am.  She noted one spot of pink blood on panty liner this morning but has had bright red blood in bathroom here.  She has pain in upper back on left side and in RLQ of abdomen today.  The pain is 8/10, intermittent, stabbing.  Yesterday, she noticed the back pain starting when she was leaning over and scrubbing.   It is not associated with eating or going to the bathroom.   There are no relieving factors.    She denies fever, weakness, dysuria, CP, SOB, nausea, vomiting.   OB History    Gravida Para Term Preterm AB TAB SAB Ectopic Multiple Living   1               Past Medical History  Diagnosis Date  . Asthma   . UTI (lower urinary tract infection) 12/14/2010    10K E.coli on ER visit for abdominal pain  . Chlamydia 08/06/11    treated at Urgent Care  . Gonorrhea 04/20/12    treated at Urgent Care  . Migraine     Past Surgical History  Procedure Laterality Date  . No past surgeries      Family History  Problem Relation Age of Onset  . Hypertension Other     History  Substance Use Topics  . Smoking status: Former Smoker -- 0.30 packs/day    Quit date: 08/30/2014  . Smokeless tobacco: Not on file  . Alcohol Use: No     Comment: OCCASIONAL    Allergies: No Known Allergies  Prescriptions prior to admission  Medication Sig Dispense Refill Last Dose  . Prenatal Vit-Min-FA-Fish Oil ([employer] PRENATAL GUMMY) 0.4-113.5 MG CHEW Chew 1 tablet by mouth daily. 30 tablet 12 09/29/2014 at Unknown time  . albuterol (PROVENTIL) (5 MG/ML) 0.5% nebulizer solution Take 2.5 mg by nebulization every 6 (six) hours as needed for wheezing or shortness of breath.   More than a month at  Unknown time    ROS Pertinent ROS in HPI  Physical Exam   Blood pressure 117/56, pulse 76, temperature 99.1 F (37.3 C), resp. rate 18, height 5\' 4"  (1.626 m), weight 117 lb (53.071 kg), last menstrual period 08/21/2014.  Physical Exam  Constitutional: She is oriented to person, place, and time. She appears well-developed and well-nourished.  HENT:  Head: Normocephalic and atraumatic.  Eyes: EOM are normal.  Neck: Normal range of motion.  Cardiovascular: Normal rate and regular rhythm.   Respiratory: Effort normal and breath sounds normal. No respiratory distress. She has no wheezes. She has no rales.  GI: Soft. Bowel sounds are normal. She exhibits no distension and no mass. There is no tenderness. There is no rebound and no guarding.  Genitourinary:  Large amt of blood with grainy appearance in vagina.   No CMT.  No adnexal mass or tenderness.    Musculoskeletal: Normal range of motion.  Neurological: She is alert and oriented to person, place, and time.  Skin: Skin is warm and dry.  Psychiatric: She has a normal mood and affect.   Results for orders placed or performed during the hospital encounter of 09/30/14 (from the past 24 hour(s))  Urinalysis, Routine w reflex microscopic     Status: Abnormal   Collection Time: 09/30/14 12:39 PM  Result Value Ref Range   Color, Urine YELLOW YELLOW   APPearance CLEAR CLEAR   Specific Gravity, Urine 1.015 1.005 - 1.030   pH 7.0 5.0 - 8.0   Glucose, UA NEGATIVE NEGATIVE mg/dL   Hgb urine dipstick LARGE (A) NEGATIVE   Bilirubin Urine NEGATIVE NEGATIVE   Ketones, ur NEGATIVE NEGATIVE mg/dL   Protein, ur NEGATIVE NEGATIVE mg/dL   Urobilinogen, UA 0.2 0.0 - 1.0 mg/dL   Nitrite NEGATIVE NEGATIVE   Leukocytes, UA NEGATIVE NEGATIVE  Urine microscopic-add on     Status: None   Collection Time: 09/30/14 12:39 PM  Result Value Ref Range   Squamous Epithelial / LPF RARE RARE   RBC / HPF 0-2 <3 RBC/hpf  CBC     Status: Abnormal   Collection  Time: 09/30/14 12:44 PM  Result Value Ref Range   WBC 2.9 (L) 4.0 - 10.5 K/uL   RBC 3.77 (L) 3.87 - 5.11 MIL/uL   Hemoglobin 12.2 12.0 - 15.0 g/dL   HCT 16.1 (L) 09.6 - 04.5 %   MCV 94.4 78.0 - 100.0 fL   MCH 32.4 26.0 - 34.0 pg   MCHC 34.3 30.0 - 36.0 g/dL   RDW 40.9 81.1 - 91.4 %   Platelets 210 150 - 400 K/uL  ABO/Rh     Status: None (Preliminary result)   Collection Time: 09/30/14 12:44 PM  Result Value Ref Range   ABO/RH(D) O POS   hCG, quantitative, pregnancy     Status: Abnormal   Collection Time: 09/30/14 12:44 PM  Result Value Ref Range   hCG, Beta Chain, Quant, S 180 (H) <5 mIU/mL  Wet prep, genital     Status: None   Collection Time: 09/30/14 12:53 PM  Result Value Ref Range   Yeast Wet Prep HPF POC NONE SEEN NONE SEEN   Trich, Wet Prep NONE SEEN NONE SEEN   Clue Cells Wet Prep HPF POC NONE SEEN NONE SEEN   WBC, Wet Prep HPF POC NONE SEEN NONE SEEN   Fax: [phone] US Ob Comp Less 14 Wks  09/30/2014   CLINICAL DATA:  Vaginal bleeding, positive pregnancy test, 5 weeks 5 days gestational age by LMP  EXAM: OBSTETRIC <14 WK [place] AND TRANSVAGINAL OB US  TECHNIQUE: Both transabdominal and transvaginal ultrasound examinations were performed for complete evaluation of the gestation as well as the maternal uterus, adnexal regions, and pelvic cul-de-sac. Transvaginal technique was performed to assess early pregnancy.  COMPARISON:  None for this pregnancy  FINDINGS: Intrauterine gestational sac: Not visualized  Yolk sac:  Not visualized  Embryo:  Not visualized  Cardiac Activity: Not visualized  Maternal uterus/adnexae: Ovaries appear normal. Small free fluid in the cul-de-sac.  IMPRESSION: No intrauterine gestational sac, yolk sac, or fetal pole identified. Differential considerations include intrauterine pregnancy too early to be sonographically visualized, missed abortion, or ectopic pregnancy. Followup ultrasound is recommended in 10-14 days for further evaluation.   Electronically  Signed   By: Christiana Pellant M.D.   On: 09/30/2014 14:03   US Ob Transvaginal  09/30/2014   CLINICAL DATA:  Vaginal bleeding, positive pregnancy test, 5 weeks 5 days gestational age by LMP  EXAM: OBSTETRIC <14 WK [place] AND TRANSVAGINAL OB US  TECHNIQUE: Both transabdominal and transvaginal ultrasound examinations were performed for complete evaluation of the gestation as well as the maternal uterus, adnexal regions, and pelvic cul-de-sac. Transvaginal  technique was performed to assess early pregnancy.  COMPARISON:  None for this pregnancy  FINDINGS: Intrauterine gestational sac: Not visualized  Yolk sac:  Not visualized  Embryo:  Not visualized  Cardiac Activity: Not visualized  Maternal uterus/adnexae: Ovaries appear normal. Small free fluid in the cul-de-sac.  IMPRESSION: No intrauterine gestational sac, yolk sac, or fetal pole identified. Differential considerations include intrauterine pregnancy too early to be sonographically visualized, missed abortion, or ectopic pregnancy. Followup ultrasound is recommended in 10-14 days for further evaluation.   Electronically Signed   By: Christiana Pellant M.D.   On: 09/30/2014 14:03   MAU Course  Procedures  MDM Hemoglobin stable.   Rh positive No evidence of ectopic on u/s  Assessment and Plan  A:  1. Threatened abortion in early pregnancy   2. Vaginal bleeding in pregnancy, first trimester    P: Discharge to home Pelvic rest Return in 48 hours for repeat quant Ectopic precautions: Patient may return to MAU as needed or if her condition were to change or worsen   Bertram Denver 09/30/2014, 12:23 PM

## 2014-09-30 NOTE — MAU Note (Signed)
Pt presents to MAU with complaints of bright red vaginal bleeding this morning when she wipes Reports sharp abdominal pains that come and go.

## 2014-09-30 NOTE — Discharge Instructions (Signed)
Pelvic Rest °Pelvic rest is sometimes recommended for women when:  °· The placenta is partially or completely covering the opening of the cervix (placenta previa). °· There is bleeding between the uterine wall and the amniotic sac in the first trimester (subchorionic hemorrhage). °· The cervix begins to open without labor starting (incompetent cervix, cervical insufficiency). °· The labor is too early (preterm labor). °HOME CARE INSTRUCTIONS °· Do not have sexual intercourse, stimulation, or an orgasm. °· Do not use tampons, douche, or put anything in the vagina. °· Do not lift anything over 10 pounds (4.5 kg). °· Avoid strenuous activity or straining your pelvic muscles. °SEEK MEDICAL CARE IF:  °· You have any vaginal bleeding during pregnancy. Treat this as a potential emergency. °· You have cramping pain felt low in the stomach (stronger than menstrual cramps). °· You notice vaginal discharge (watery, mucus, or bloody). °· You have a low, dull backache. °· There are regular contractions or uterine tightening. °SEEK IMMEDIATE MEDICAL CARE IF: °You have vaginal bleeding and have placenta previa.  °Document Released: 10/10/2010 Document Revised: 09/07/2011 Document Reviewed: 10/10/2010 °ExitCare® Patient Information ©2015 [employer]. This information is not intended to replace advice given to you by your health care provider. Make sure you discuss any questions you have with your health care provider. °Threatened Miscarriage °A threatened miscarriage occurs when you have vaginal bleeding during your first 20 weeks of pregnancy but the pregnancy has not ended. If you have vaginal bleeding during this time, your health care provider will do tests to make sure you are still pregnant. If the tests show you are still pregnant and the developing baby (fetus) inside your womb (uterus) is still growing, your condition is considered a threatened miscarriage. °A threatened miscarriage does not mean your pregnancy will end,  but it does increase the risk of losing your pregnancy (complete miscarriage). °CAUSES  °The cause of a threatened miscarriage is usually not known. If you go on to have a complete miscarriage, the most common cause is an abnormal number of chromosomes in the developing baby. Chromosomes are the structures inside cells that hold all your genetic material. °Some causes of vaginal bleeding that do not result in miscarriage include: °· Having sex. °· Having an infection. °· Normal hormone changes of pregnancy. °· Bleeding that occurs when an egg implants in your uterus. °RISK FACTORS °Risk factors for bleeding in early pregnancy include: °· Obesity. °· Smoking. °· Drinking excessive amounts of alcohol or caffeine. °· Recreational drug use. °SIGNS AND SYMPTOMS °· Light vaginal bleeding. °· Mild abdominal pain or cramps. °DIAGNOSIS  °If you have bleeding with or without abdominal pain before 20 weeks of pregnancy, your health care provider will do tests to check whether you are still pregnant. One important test involves using sound waves and a computer (ultrasound) to create images of the inside of your uterus. Other tests include an internal exam of your vagina and uterus (pelvic exam) and measurement of your baby's heart rate.  °You may be diagnosed with a threatened miscarriage if: °· Ultrasound testing shows you are still pregnant. °· Your baby's heart rate is strong. °· A pelvic exam shows that the opening between your uterus and your vagina (cervix) is closed. °· Your heart rate and blood pressure are stable. °· Blood tests confirm you are still pregnant. °TREATMENT  °No treatments have been shown to prevent a threatened miscarriage from going on to a complete miscarriage. However, the right home care is important.  °HOME CARE INSTRUCTIONS  °·   Make sure you keep all your appointments for prenatal care. This is very important.  Get plenty of rest.  Do not have sex or use tampons if you have vaginal  bleeding.  Do not douche.  Do not smoke or use recreational drugs.  Do not drink alcohol.  Avoid caffeine. SEEK MEDICAL CARE IF:  You have light vaginal bleeding or spotting while pregnant.  You have abdominal pain or cramping.  You have a fever. SEEK IMMEDIATE MEDICAL CARE IF:  You have heavy vaginal bleeding.  You have blood clots coming from your vagina.  You have severe low back pain or abdominal cramps.  You have fever, chills, and severe abdominal pain. MAKE SURE YOU:  Understand these instructions.  Will watch your condition.  Will get help right away if you are not doing well or get worse. Document Released: 06/15/2005 Document Revised: 06/20/2013 Document Reviewed: 04/11/2013 [facility] Patient Information 2015 [employer] [place]. This information is not intended to replace advice given to you by your health care provider. Make sure you discuss any questions you have with your health care provider.

## 2014-10-01 LAB — GC/CHLAMYDIA PROBE AMP (~~LOC~~) NOT AT ARMC
Chlamydia: NEGATIVE
Neisseria Gonorrhea: NEGATIVE

## 2014-10-01 LAB — HIV ANTIBODY (ROUTINE TESTING W REFLEX): HIV Screen 4th Generation wRfx: NONREACTIVE

## 2014-10-02 ENCOUNTER — Inpatient Hospital Stay (HOSPITAL_COMMUNITY)
Admission: AD | Admit: 2014-10-02 | Discharge: 2014-10-02 | Disposition: A | Discharge status: Home / Self Care | Payer: Self-pay | Source: Ambulatory Visit | Attending: Family Medicine | Admitting: Family Medicine

## 2014-10-02 DIAGNOSIS — O209 Hemorrhage in early pregnancy, unspecified: Secondary | ICD-10-CM | POA: Insufficient documentation

## 2014-10-02 DIAGNOSIS — Z3A01 Less than 8 weeks gestation of pregnancy: Secondary | ICD-10-CM | POA: Insufficient documentation

## 2014-10-02 LAB — HCG, QUANTITATIVE, PREGNANCY: hCG, Beta Chain, Quant, S: 86 m[IU]/mL — ABNORMAL HIGH (ref <5)

## 2014-10-02 NOTE — MAU Note (Signed)
Pt for F/U BHCG.  Pt had dark brown spotting, no bleeding this a.m.  Occasional mild cramping.

## 2014-10-02 NOTE — Discharge Instructions (Signed)
Vaginal Bleeding During Pregnancy, First Trimester  A small amount of bleeding (spotting) from the vagina is relatively common in early pregnancy. It usually stops on its own. Various things may cause bleeding or spotting in early pregnancy. Some bleeding may be related to the pregnancy, and some may not. In most cases, the bleeding is normal and is not a problem. However, bleeding can also be a sign of something serious. Be sure to tell your health care provider about any vaginal bleeding right away.  Some possible causes of vaginal bleeding during the first trimester include:  · Infection or inflammation of the cervix.  · Growths (polyps) on the cervix.  · Miscarriage or threatened miscarriage.  · Pregnancy tissue has developed outside of the uterus and in a fallopian tube (tubal pregnancy).  · Tiny cysts have developed in the uterus instead of pregnancy tissue (molar pregnancy).  HOME CARE INSTRUCTIONS   Watch your condition for any changes. The following actions may help to lessen any discomfort you are feeling:  · Follow your health care provider's instructions for limiting your activity. If your health care provider orders bed rest, you may need to stay in bed and only get up to use the bathroom. However, your health care provider may allow you to continue light activity.  · If needed, make plans for someone to help with your regular activities and responsibilities while you are on bed rest.  · Keep track of the number of pads you use each day, how often you change pads, and how soaked (saturated) they are. Write this down.  · Do not use tampons. Do not douche.  · Do not have sexual intercourse or orgasms until approved by your health care provider.  · If you pass any tissue from your vagina, save the tissue so you can show it to your health care provider.  · Only take over-the-counter or prescription medicines as directed by your health care provider.  · Do not take aspirin because it can make you  bleed.  · Keep all follow-up appointments as directed by your health care provider.  SEEK MEDICAL CARE IF:  · You have any vaginal bleeding during any part of your pregnancy.  · You have cramps or labor pains.  · You have a fever, not controlled by medicine.  SEEK IMMEDIATE MEDICAL CARE IF:   · You have severe cramps in your back or belly (abdomen).  · You pass large clots or tissue from your vagina.  · Your bleeding increases.  · You feel light-headed or weak, or you have fainting episodes.  · You have chills.  · You are leaking fluid or have a gush of fluid from your vagina.  · You pass out while having a bowel movement.  MAKE SURE YOU:  · Understand these instructions.  · Will watch your condition.  · Will get help right away if you are not doing well or get worse.  Document Released: 03/25/2005 Document Revised: 06/20/2013 Document Reviewed: 02/20/2013  ExitCare® Patient Information ©2015 [employer]. This information is not intended to replace advice given to you by your health care provider. Make sure you discuss any questions you have with your health care provider.

## 2014-10-02 NOTE — MAU Provider Note (Signed)
Shari Davidson 24 y.o.G1P0 @ 44101w0d Who presents for follow-up quantitative beta hCG. She was initially seen 2 days ago for scant red vaginal bleeding. She denies abdominal pain or cramps. She has slight brown spotting at this time. Pregnancy is desired. US showed no evidence of pregnancy.   OBJECTIVE: Filed Vitals:   10/02/14 0942  BP: 114/58  Pulse: 70  Temp: 98.3 F (36.8 C)  Resp: 16  Gen: NAD Abd: NT, soft  Results for Shari Davidson, Shari Davidson (MRN [id number]) as of 10/02/2014 13:39  Ref. Range 09/30/2014 12:44 10/02/2014 09:45  hCG, Beta Chain, Quant, S Latest Range: <5 mIU/mL 180 (H) 86 (H)     ASSESSMENT: Bleeding in early pregnancy. Falling quant betaHCG c/w EPF Pregnancy of unknown location.  PLAN: Return for another quantitative beta hCG in 48 hrs.  Bleeding and ectopic precautions

## 2014-10-07 ENCOUNTER — Encounter (HOSPITAL_COMMUNITY): Payer: Self-pay | Admitting: *Deleted

## 2014-10-07 ENCOUNTER — Inpatient Hospital Stay (HOSPITAL_COMMUNITY)
Admission: AD | Admit: 2014-10-07 | Discharge: 2014-10-07 | Disposition: A | Discharge status: Home / Self Care | Payer: Self-pay | Source: Ambulatory Visit | Attending: Family Medicine | Admitting: Family Medicine

## 2014-10-07 DIAGNOSIS — Z87891 Personal history of nicotine dependence: Secondary | ICD-10-CM | POA: Insufficient documentation

## 2014-10-07 DIAGNOSIS — O021 Missed abortion: Secondary | ICD-10-CM

## 2014-10-07 DIAGNOSIS — R109 Unspecified abdominal pain: Secondary | ICD-10-CM | POA: Insufficient documentation

## 2014-10-07 DIAGNOSIS — Z3A01 Less than 8 weeks gestation of pregnancy: Secondary | ICD-10-CM | POA: Insufficient documentation

## 2014-10-07 HISTORY — DX: Contact with and (suspected) exposure to infections with a predominantly sexual mode of transmission: Z20.2

## 2014-10-07 LAB — URINALYSIS, ROUTINE W REFLEX MICROSCOPIC
Bilirubin Urine: NEGATIVE
Glucose, UA: NEGATIVE mg/dL
Ketones, ur: NEGATIVE mg/dL
LEUKOCYTES UA: NEGATIVE
NITRITE: NEGATIVE
PROTEIN: NEGATIVE mg/dL
Specific Gravity, Urine: <1.005 — ABNORMAL LOW (ref 1.005–1.030)
Urobilinogen, UA: 0.2 mg/dL (ref 0.0–1.0)
pH: 5.5 (ref 5.0–8.0)

## 2014-10-07 LAB — CBC
HCT: 35.6 % — ABNORMAL LOW (ref 36.0–46.0)
HEMOGLOBIN: 12.5 g/dL (ref 12.0–15.0)
MCH: 32.4 pg (ref 26.0–34.0)
MCHC: 35.1 g/dL (ref 30.0–36.0)
MCV: 92.2 fL (ref 78.0–100.0)
PLATELETS: 238 10*3/uL (ref 150–400)
RBC: 3.86 MIL/uL — AB (ref 3.87–5.11)
RDW: 11.3 % — ABNORMAL LOW (ref 11.5–15.5)
WBC: 4.5 10*3/uL (ref 4.0–10.5)

## 2014-10-07 LAB — URINE MICROSCOPIC-ADD ON

## 2014-10-07 LAB — HCG, QUANTITATIVE, PREGNANCY: hCG, Beta Chain, Quant, S: 84 m[IU]/mL — ABNORMAL HIGH (ref <5)

## 2014-10-07 MED ORDER — OXYCODONE-ACETAMINOPHEN 5-325 MG PO TABS: 1.0000 | ORAL_TABLET | Freq: Four times a day (QID) | ORAL | Status: DC | PRN

## 2014-10-07 MED ORDER — IBUPROFEN 600 MG PO TABS: 600.0000 mg | ORAL_TABLET | Freq: Four times a day (QID) | ORAL | Status: DC | PRN

## 2014-10-07 MED ORDER — MISOPROSTOL 200 MCG PO TABS: 800.0000 ug | ORAL_TABLET | Freq: Once | ORAL | Status: DC

## 2014-10-07 MED ORDER — PROMETHAZINE HCL 25 MG PO TABS: 25.0000 mg | ORAL_TABLET | Freq: Four times a day (QID) | ORAL | Status: DC | PRN

## 2014-10-07 NOTE — MAU Provider Note (Signed)
History     CSN: [id number]  Arrival date and time: 10/07/14 2152   None     Chief Complaint  Patient presents with  . Abdominal Cramping  . Vaginal Bleeding   HPI This is a 24 y.o. female who presents with c/o increased bleeding and cramping today. States is like a period. Cramping is dull, located in lower abdomen. Did not come back for repeat quant because her friends told her if she drank water and rested it would be ok.  Quants dropped last time she was here, from 180 to 86.      Abdominal Pain This is a recurrent problem. The current episode started today. The onset quality is gradual. The problem occurs intermittently. The problem has been unchanged. The pain is located in the suprapubic region. The pain is mild. The quality of the pain is cramping. The abdominal pain does not radiate. Pertinent negatives include no constipation, diarrhea, dysuria, fever, frequency, headaches, myalgias, nausea or vomiting. Nothing aggravates the pain. The pain is relieved by nothing. She has tried nothing for the symptoms.   RN Note: Was supposed to come back last Thursday for follow up HCG, but felt ok and didn't think she needed to come. Heavy red vaginal bleeding started today with clots. Abdominal cramping started today.           OB History    Gravida Para Term Preterm AB TAB SAB Ectopic Multiple Living   1               Past Medical History  Diagnosis Date  . Asthma   . UTI (lower urinary tract infection) 12/14/2010    10K E.coli on ER visit for abdominal pain  . Chlamydia 08/06/11    treated at Urgent Care  . Gonorrhea 04/20/12    treated at Urgent Care  . Migraine     Past Surgical History  Procedure Laterality Date  . No past surgeries      Family History  Problem Relation Age of Onset  . Hypertension Other     History  Substance Use Topics  . Smoking status: Former Smoker -- 0.30 packs/day    Quit date: 08/30/2014  . Smokeless tobacco: Not on file  .  Alcohol Use: No     Comment: OCCASIONAL    Allergies: No Known Allergies  Prescriptions prior to admission  Medication Sig Dispense Refill Last Dose  . albuterol (PROVENTIL HFA;VENTOLIN HFA) 108 (90 BASE) MCG/ACT inhaler Inhale 2 puffs into the lungs every 6 (six) hours as needed for wheezing or shortness of breath.   prn  . Prenatal Vit-Min-FA-Fish Oil ([facility] PRENATAL GUMMY) 0.4-113.5 MG CHEW Chew 1 tablet by mouth daily. 30 tablet 12 09/29/2014 at Unknown time    Review of Systems  Constitutional: Negative for fever, chills and malaise/fatigue.  Gastrointestinal: Positive for abdominal pain. Negative for nausea, vomiting, diarrhea and constipation.  Genitourinary: Negative for dysuria.       Bleeding, like a period   Neurological: Negative for dizziness and headaches.   Physical Exam   Blood pressure 118/72, pulse 95, temperature 98.7 F (37.1 C), temperature source Oral, resp. rate 16, height 5\' 4"  (1.626 m), weight 118 lb 3.2 oz (53.615 kg), last menstrual period 08/21/2014, SpO2 100 %.  Physical Exam  Constitutional: She is oriented to person, place, and time. She appears well-developed and well-nourished. No distress.  HENT:  Head: Normocephalic.  Cardiovascular: Normal rate and regular rhythm.   Respiratory: Effort normal. No respiratory  distress.  GI: Soft. She exhibits no distension and no mass. There is tenderness (slightly tender over suprapubic). There is no rebound and no guarding.  Genitourinary: Vaginal discharge (small blood) found.  Cervix closed and long Uterus small Uterus nontender Adnexae nontender   Musculoskeletal: Normal range of motion.  Neurological: She is alert and oriented to person, place, and time.  Skin: Skin is warm and dry.  Psychiatric: She has a normal mood and affect.    MAU Course  Procedures  MDM Results for orders placed or performed during the hospital encounter of 10/07/14 (from the past 24 hour(s))  Urinalysis, Routine w reflex  microscopic     Status: Abnormal   Collection Time: 10/07/14 10:04 PM  Result Value Ref Range   Color, Urine YELLOW YELLOW   APPearance CLEAR CLEAR   Specific Gravity, Urine <1.005 (L) 1.005 - 1.030   pH 5.5 5.0 - 8.0   Glucose, UA NEGATIVE NEGATIVE mg/dL   Hgb urine dipstick LARGE (A) NEGATIVE   Bilirubin Urine NEGATIVE NEGATIVE   Ketones, ur NEGATIVE NEGATIVE mg/dL   Protein, ur NEGATIVE NEGATIVE mg/dL   Urobilinogen, UA 0.2 0.0 - 1.0 mg/dL   Nitrite NEGATIVE NEGATIVE   Leukocytes, UA NEGATIVE NEGATIVE  Urine microscopic-add on     Status: None   Collection Time: 10/07/14 10:04 PM  Result Value Ref Range   Squamous Epithelial / LPF RARE RARE   WBC, UA 0-2 <3 WBC/hpf   RBC / HPF 3-6 <3 RBC/hpf   Bacteria, UA RARE RARE  CBC     Status: Abnormal   Collection Time: 10/07/14 10:15 PM  Result Value Ref Range   WBC 4.5 4.0 - 10.5 K/uL   RBC 3.86 (L) 3.87 - 5.11 MIL/uL   Hemoglobin 12.5 12.0 - 15.0 g/dL   HCT 19.1 (L) 47.8 - 29.5 %   MCV 92.2 78.0 - 100.0 fL   MCH 32.4 26.0 - 34.0 pg   MCHC 35.1 30.0 - 36.0 g/dL   RDW 62.1 (L) 30.8 - 65.7 %   Platelets 238 150 - 400 K/uL  hCG, quantitative, pregnancy     Status: Abnormal   Collection Time: 10/07/14 10:15 PM  Result Value Ref Range   hCG, Beta Chain, Quant, S 84 (H) <5 mIU/mL   Last Quant HCG was 86 on 10/02/14  Assessment and Plan  A:  Pregnancy at [redacted]w[redacted]d       Missed abortion      Dropping HCG levels  P:  Discussed findings       Reviewed option of expectant management vs Cytotec. RIsks and benefits reviewed. Patient would like to proceed with Cytotec at home.        Reviewed how to do it. Repeat in 24 hrs if it does not produce results        Bleeding precautions        Appointment given for Next Friday 10/12/14 for Clinic to have HCG drawn.   Early Intrauterine Pregnancy Failure Protocol  X Documented intrauterine pregnancy failure less than or equal to [redacted] weeks gestation  X No serious current illness  X Baseline Hgb  greater than or equal to 10g/dl  X Patient has easily accessible transportation to the hospital  X Clear preference  X Practitioner/physician deems patient reliable  X Counseling by practitioner or physician  X Patient education by RN  X Consent form signed  Rho-Gam given by RN if indicated   _x_ Cytotec 800 mcg Intravaginally by patient at  home   X Ibuprofen 600 mg 1 tablet by mouth every 6 hours as needed #30 - prescribed  X Percocet mg by mouth every 4 to 6 hours as needed - prescribed  x__ Phenergan 12.5 mg by mouth every 4 hours as needed for nausea - prescribed    [facility] 10/07/2014, 10:11 PM

## 2014-10-07 NOTE — MAU Note (Signed)
Was supposed to come back last Thursday for follow up HCG, but felt ok and didn't think she needed to come. Heavy red vaginal bleeding started today with clots. Abdominal cramping started today.

## 2014-10-07 NOTE — Discharge Instructions (Signed)
Incomplete Miscarriage °A miscarriage is the sudden loss of an unborn baby (fetus) before the 20th week of pregnancy. In an incomplete miscarriage, parts of the fetus or placenta (afterbirth) remain in the body.  °Having a miscarriage can be an emotional experience. Talk with your health care provider about any questions you may have about miscarrying, the grieving process, and your future pregnancy plans. °CAUSES  °· Problems with the fetal chromosomes that make it impossible for the baby to develop normally. Problems with the baby's genes or chromosomes are most often the result of errors that occur by chance as the embryo divides and grows. The problems are not inherited from the parents. °· Infection of the cervix or uterus. °· Hormone problems. °· Problems with the cervix, such as having an incompetent cervix. This is when the tissue in the cervix is not strong enough to hold the pregnancy. °· Problems with the uterus, such as an abnormally shaped uterus, uterine fibroids, or congenital abnormalities. °· Certain medical conditions. °· Smoking, drinking alcohol, or taking illegal drugs. °· Trauma. °SYMPTOMS  °· Vaginal bleeding or spotting, with or without cramps or pain. °· Pain or cramping in the abdomen or lower back. °· Passing fluid, tissue, or blood clots from the vagina. °DIAGNOSIS  °Your health care provider will perform a physical exam. You may also have an ultrasound to confirm the miscarriage. Blood or urine tests may also be ordered. °TREATMENT  °· Usually, a dilation and curettage (D&C) procedure is performed. During a D&C procedure, the cervix is widened (dilated) and any remaining fetal or placental tissue is gently removed from the uterus. °· Antibiotic medicines are prescribed if there is an infection. Other medicines may be given to reduce the size of the uterus (contract) if there is a lot of bleeding. °· If you have Rh negative blood and your baby was Rh positive, you will need a Rho (D)  immune globulin shot. This shot will protect any future baby from having Rh blood problems in future pregnancies. °· You may be confined to bed rest. This means you should stay in bed and only get up to use the bathroom. °HOME CARE INSTRUCTIONS  °· Rest as directed by your health care provider. °· Restrict activity as directed by your health care provider. You may be allowed to continue light activity if curettage was not done but you require further treatment. °· Keep track of the number of pads you use each day. Keep track of how soaked (saturated) they are. Record this information. °· Do not  use tampons. °· Do not douche or have sexual intercourse until approved by your health care provider. °· Keep all follow-up appointments for reevaluation and continuing management. °· Only take over-the-counter or prescription medicines for pain, fever, or discomfort as directed by your health care provider. °· Take antibiotic medicine as directed by your health care provider. Make sure you finish it even if you start to feel better. °SEEK IMMEDIATE MEDICAL CARE IF:  °· You experience severe cramps in your stomach, back, or abdomen. °· You have an unexplained temperature (make sure to record these temperatures). °· You pass large clots or tissue (save these for your health care provider to inspect). °· Your bleeding increases. °· You become light-headed, weak, or have fainting episodes. °MAKE SURE YOU:  °· Understand these instructions. °· Will watch your condition. °· Will get help right away if you are not doing well or get worse. °Document Released: 06/15/2005 Document Revised: 10/30/2013 Document Reviewed:   01/12/2013 ExitCare Patient Information 2015 [place], [place]. This information is not intended to replace advice given to you by your health care provider. Make sure you discuss any questions you have with your health care provider.   FACTS YOU SHOULD KNOW  WHAT IS AN EARLY PREGNANCY FAILURE? Once the egg is  fertilized with the sperm and begins to develop, it attaches to the lining of the uterus. This early pregnancy tissue may not develop into an embryo (the beginning stage of a baby). Sometimes an embryo does develop but does not continue to grow. These problems can be seen on ultrasound.   MANAGEMNT OF EARLY PREGNANCY FAILURE: About 4 out of 100 (0.25%) women will have a pregnancy loss in her lifetime.  One in five pregnancies is found to be an early pregnancy failure.  There are 3 ways to care for an early pregnancy failure:   (1) Surgery, (2) Medicine, (3) Waiting for you to pass the pregnancy on your own. The decision as to how to proceed after being diagnosed with and early pregnancy failure is an individual one.  The decision can be made only after appropriate counseling.  You need to weigh the pros and cons of the 3 choices. Then you can make the choice that works for you. SURGERY (D&E)  Procedure over in 1 day  Requires being put to sleep  Bleeding may be light  Possible problems during surgery, including injury to womb(uterus)  Care provider has more control Medicine (CYTOTEC)  The complete procedure may take days to weeks  No Surgery  Bleeding may be heavy at times  There may be drug side effects  Patient has more control Waiting  You may choose to wait, in which case your own body may complete the passing of the abnormal early pregnancy on its own in about 2-4 weeks  Your bleeding may be heavy at times  There is a small possibility that you may need surgery if the bleeding is too much or not all of the pregnancy has passed.  Pelvic Rest Pelvic rest is sometimes recommended for women when:   The placenta is partially or completely covering the opening of the cervix (placenta previa).  There is bleeding between the uterine wall and the amniotic sac in the first trimester (subchorionic hemorrhage).  The cervix begins to open without labor starting (incompetent cervix,  cervical insufficiency).  The labor is too early (preterm labor). HOME CARE INSTRUCTIONS  Do not have sexual intercourse, stimulation, or an orgasm.  Do not use tampons, douche, or put anything in the vagina.  Do not lift anything over 10 pounds (4.5 kg).  Avoid strenuous activity or straining your pelvic muscles. SEEK MEDICAL CARE IF:  You have any vaginal bleeding during pregnancy. Treat this as a potential emergency.  You have cramping pain felt low in the stomach (stronger than menstrual cramps).  You notice vaginal discharge (watery, mucus, or bloody).  You have a low, dull backache.  There are regular contractions or uterine tightening. SEEK IMMEDIATE MEDICAL CARE IF: You have vaginal bleeding and have placenta previa.  Document Released: 10/10/2010 Document Revised: 09/07/2011 Document Reviewed: 10/10/2010 [facility] Patient Information 2015 [place], [place]. This information is not intended to replace advice given to you by your health care provider. Make sure you discuss any questions you have with your health care provider.   CYTOTEC MANAGEMENT Prostaglandins (cytotec) are the most widely used drug for this purpose. They cause the uterus to cramp and contract. You will place  the medicine yourself inside your vagina in the privacy of your home. Empting of the uterus should occur within 3 days but the process may continue for several weeks. The bleeding may seem heavy at times.  POSSIBLE SIDE EFFECTS FROM CYTOTEC  Nausea   Vomiting  Diarrhea Fever  Chills  Hot Flashes Side effects  from the process of the early pregnancy failure include:  Cramping  Bleeding  Headaches  Dizziness   RISKS: This is a low risk procedure. Less than 1 in 100 women has a complication. An incomplete passage of the early pregnancy may occur. Also, Hemorrhage (heavy bleeding) could happen.  Rarely the pregnancy will not be passed completely. Excessively heavy bleeding may occur.  Your  doctor may need to perform surgery to empty the uterus (D&E). Afterwards: Everybody will feel differently after the early pregnancy completion. You may have soreness or cramps for a day or two. You may have soreness or cramps for day or two.  You may have light bleeding for up to 2 weeks. You may be as active as you feel like being. If you have any of the following problems you may call Maternity Admissions Unit at [phone].  If you have pain that does not get better  with pain medication  Bleeding that soaks through 2 thick full-sized sanitary pads in an hour  Cramps that last longer than 2 days  Foul smelling discharge  Fever above 100.4 degrees F Even if you do not have any of these symptoms, you should have a follow-up exam to make sure you are healing properly. This appointment will be made for you before you leave the hospital. Your next normal period will start again in 4-6 week after the loss. You can get pregnant soon after the loss, so use birth control right away. Finally: Make sure all your questions are answered before during and after any procedure. Follow up with medical care and family planning methods.

## 2014-10-12 ENCOUNTER — Other Ambulatory Visit: Payer: Self-pay

## 2014-10-12 ENCOUNTER — Telehealth: Payer: Self-pay | Admitting: *Deleted

## 2014-10-12 NOTE — Telephone Encounter (Signed)
Contacted patient, patient forgot appointment, will come Monday 4/18 @ 1000.

## 2014-10-15 ENCOUNTER — Other Ambulatory Visit: Payer: Self-pay

## 2014-10-15 ENCOUNTER — Telehealth: Payer: Self-pay | Admitting: Obstetrics and Gynecology

## 2014-10-15 NOTE — Telephone Encounter (Signed)
Called left message for patient to return call to clinic.

## 2015-01-13 ENCOUNTER — Emergency Department (INDEPENDENT_AMBULATORY_CARE_PROVIDER_SITE_OTHER)
Admission: EM | Admit: 2015-01-13 | Discharge: 2015-01-13 | Disposition: A | Discharge status: Home / Self Care | Payer: Self-pay | Source: Home / Self Care | Attending: Family Medicine | Admitting: Family Medicine

## 2015-01-13 ENCOUNTER — Encounter (HOSPITAL_COMMUNITY): Payer: Self-pay | Admitting: Emergency Medicine

## 2015-01-13 ENCOUNTER — Other Ambulatory Visit (HOSPITAL_COMMUNITY)
Admission: RE | Admit: 2015-01-13 | Discharge: 2015-01-13 | Disposition: A | Discharge status: Home / Self Care | Payer: Self-pay | Source: Ambulatory Visit | Attending: Family Medicine | Admitting: Family Medicine

## 2015-01-13 DIAGNOSIS — N760 Acute vaginitis: Secondary | ICD-10-CM | POA: Insufficient documentation

## 2015-01-13 DIAGNOSIS — Z7251 High risk heterosexual behavior: Secondary | ICD-10-CM

## 2015-01-13 DIAGNOSIS — N9489 Other specified conditions associated with female genital organs and menstrual cycle: Secondary | ICD-10-CM

## 2015-01-13 DIAGNOSIS — Z113 Encounter for screening for infections with a predominantly sexual mode of transmission: Secondary | ICD-10-CM | POA: Insufficient documentation

## 2015-01-13 DIAGNOSIS — N898 Other specified noninflammatory disorders of vagina: Secondary | ICD-10-CM

## 2015-01-13 LAB — POCT URINALYSIS DIP (DEVICE)
BILIRUBIN URINE: NEGATIVE
Glucose, UA: NEGATIVE mg/dL
Ketones, ur: NEGATIVE mg/dL
LEUKOCYTES UA: NEGATIVE
NITRITE: NEGATIVE
PH: 5.5 (ref 5.0–8.0)
Protein, ur: NEGATIVE mg/dL
Specific Gravity, Urine: 1.025 (ref 1.005–1.030)
Urobilinogen, UA: 0.2 mg/dL (ref 0.0–1.0)

## 2015-01-13 LAB — POCT PREGNANCY, URINE: Preg Test, Ur: NEGATIVE

## 2015-01-13 NOTE — ED Notes (Signed)
Call back number verified.  

## 2015-01-13 NOTE — ED Provider Notes (Signed)
CSN: [id number]     Arrival date & time 01/13/15  1539 History   First MD Initiated Contact with Patient 01/13/15 1639     Chief Complaint  Patient presents with  . Abdominal Pain  . Menstrual Problem   (Consider location/radiation/quality/duration/timing/severity/associated sxs/prior Treatment) HPI Comments: Patient presents today with an "odor" to her vaginal discharge; though current on her menses. She remains concerned about STD's. She denies abdominal pain; noted "cramping". No fever, chills, lesions are noted. No urinary symptoms.   Patient is a 24 y.o. female presenting with abdominal pain. The history is provided by the patient.  Abdominal Pain   Past Medical History  Diagnosis Date  . Asthma   . UTI (lower urinary tract infection) 12/14/2010    10K E.coli on ER visit for abdominal pain  . Chlamydia 08/06/11    treated at Urgent Care  . Gonorrhea 04/20/12    treated at Urgent Care  . Migraine   . Trichomonas contact, treated    Past Surgical History  Procedure Laterality Date  . No past surgeries     Family History  Problem Relation Age of Onset  . Hypertension Other    History  Substance Use Topics  . Smoking status: Former Smoker -- 0.30 packs/day    Quit date: 08/30/2014  . Smokeless tobacco: Not on file  . Alcohol Use: No     Comment: OCCASIONAL   OB History    Gravida Para Term Preterm AB TAB SAB Ectopic Multiple Living   1              Review of Systems  Gastrointestinal: Positive for abdominal pain.  All other systems reviewed and are negative.   Allergies  Review of patient's allergies indicates no known allergies.  Home Medications   Prior to Admission medications   Medication Sig Start Date End Date Taking? Authorizing Provider  albuterol (PROVENTIL HFA;VENTOLIN HFA) 108 (90 BASE) MCG/ACT inhaler Inhale 2 puffs into the lungs every 6 (six) hours as needed for wheezing or shortness of breath.    Historical Provider, MD  ibuprofen  (ADVIL,MOTRIN) 600 MG tablet Take 1 tablet (600 mg total) by mouth every 6 (six) hours as needed. 10/07/14   Aviva Signs, CNM  misoprostol (CYTOTEC) 200 MCG tablet Place 4 tablets (800 mcg total) vaginally once. 10/07/14   Aviva Signs, CNM  oxyCODONE-acetaminophen (PERCOCET/ROXICET) 5-325 MG per tablet Take 1-2 tablets by mouth every 6 (six) hours as needed. 10/07/14   Aviva Signs, CNM  Prenatal Vit-Min-FA-Fish Oil ([facility] PRENATAL GUMMY) 0.4-113.5 MG CHEW Chew 1 tablet by mouth daily. 09/25/14   Dorathy Kinsman, CNM  promethazine (PHENERGAN) 25 MG tablet Take 1 tablet (25 mg total) by mouth every 6 (six) hours as needed for nausea or vomiting. 10/07/14   Aviva Signs, CNM   BP 108/64 mmHg  Pulse 79  Temp(Src) 98.8 F (37.1 C) (Oral)  Resp 16  SpO2 97%  LMP 08/21/2014 (Exact Date)  Breastfeeding? Unknown Physical Exam  Constitutional: She appears well-developed and well-nourished. No distress.  Abdominal: Soft. Bowel sounds are normal. She exhibits no distension. There is no tenderness. There is no rebound and no guarding.  Genitourinary: Vagina normal and uterus normal. No vaginal discharge found.  Blood in vaginal vault-normal appearing. No lesions, odor or abnormal discharge noted.  Skin: Skin is warm and dry. She is not diaphoretic.  Psychiatric: Her behavior is normal.  Nursing note and vitals reviewed.   ED Course  Procedures (including  critical care time) Labs Review Labs Reviewed  POCT URINALYSIS DIP (DEVICE) - Abnormal; Notable for the following:    Hgb urine dipstick MODERATE (*)    All other components within normal limits  POCT PREGNANCY, URINE  CERVICOVAGINAL ANCILLARY ONLY    Imaging Review No results found.   MDM   1. Vaginal odor   2. High risk sexual behavior   Normal exam; on menses. Screened as requested for STD's. Suggest f/u screenings with health department. Will call if these results are abnormal. Educated re safe sex.     Riki SheerMichelle G  Young, PA-C 01/13/15 1710

## 2015-01-13 NOTE — ED Notes (Addendum)
Pt c/o abd pain onset 1-2 weeks w/abn spotting  Would also like to be screened for STD Reports she had a miscarriage back in 09/2014 Denies fevers, chills, urinary sx Alert, no signs of acute distress.

## 2015-01-13 NOTE — Discharge Instructions (Signed)
Safe Sex Safe sex is about reducing the risk of giving or getting a sexually transmitted disease (STD). STDs are spread through sexual contact involving the genitals, mouth, or rectum. Some STDs can be cured and others cannot. Safe sex can also prevent unintended pregnancies.  WHAT ARE SOME SAFE SEX PRACTICES?  Limit your sexual activity to only one partner who is having sex with only you.  Talk to your partner about his or her past partners, past STDs, and drug use.  Use a condom every time you have sexual intercourse. This includes vaginal, oral, and anal sexual activity. Both females and males should wear condoms during oral sex. Only use latex or polyurethane condoms and water-based lubricants. Using petroleum-based lubricants or oils to lubricate a condom will weaken the condom and increase the chance that it will break. The condom should be in place from the beginning to the end of sexual activity. Wearing a condom reduces, but does not completely eliminate, your risk of getting or giving an STD. STDs can be spread by contact with infected body fluids and skin.  Get vaccinated for hepatitis B and HPV.  Avoid alcohol and recreational drugs, which can affect your judgment. You may forget to use a condom or participate in high-risk sex.  For females, avoid douching after sexual intercourse. Douching can spread an infection farther into the reproductive tract.  Check your body for signs of sores, blisters, rashes, or unusual discharge. See your health care provider if you notice any of these signs.  Avoid sexual contact if you have symptoms of an infection or are being treated for an STD. If you or your partner has herpes, avoid sexual contact when blisters are present. Use condoms at all other times.  If you are at risk of being infected with HIV, it is recommended that you take a prescription medicine daily to prevent HIV infection. This is called pre-exposure prophylaxis (PrEP). You are  considered at risk if:  You are a man who has sex with other men (MSM).  You are a heterosexual man or woman who is sexually active with more than one partner.  You take drugs by injection.  You are sexually active with a partner who has HIV.  Talk with your health care provider about whether you are at high risk of being infected with HIV. If you choose to begin PrEP, you should first be tested for HIV. You should then be tested every 3 months for as long as you are taking PrEP.  See your health care provider for regular screenings, exams, and tests for other STDs. Before having sex with a new partner, each of you should be screened for STDs and should talk about the results with each other. WHAT ARE THE BENEFITS OF SAFE SEX?   There is less chance of getting or giving an STD.  You can prevent unwanted or unintended pregnancies.  By discussing safe sex concerns with your partner, you may increase feelings of intimacy, comfort, trust, and honesty between the two of you. Document Released: 07/23/2004 Document Revised: 10/30/2013 Document Reviewed: 12/07/2011 [facility] Patient Information 2015 [place], [place]. This information is not intended to replace advice given to you by your health care provider. Make sure you discuss any questions you have with your health care provider.   I did not see any abnormal findings. Practice safe sex. If you want screenings in the future suggest the health department.

## 2015-01-14 ENCOUNTER — Telehealth (HOSPITAL_COMMUNITY): Payer: Self-pay

## 2015-01-14 LAB — CERVICOVAGINAL ANCILLARY ONLY
Chlamydia: NEGATIVE
Neisseria Gonorrhea: NEGATIVE

## 2015-01-14 NOTE — ED Notes (Signed)
Mary from cytology called stating HPV test had to be performed from specimen that was received.  HPV test was cancelled and provider was notified.

## 2015-01-15 LAB — CERVICOVAGINAL ANCILLARY ONLY: WET PREP (BD AFFIRM): POSITIVE — AB

## 2015-01-15 NOTE — ED Notes (Signed)
Final lab report positive for gardnerella only, negative for GC, chlamydia, trich, yeast. Spoke w patient , who desires Rx to expedite resolution of gardnerella [facility] Called in Rx for flagyl 500 mg, PO, BID x 7 days NR . Spoke w pharmacy staff.

## 2015-03-22 ENCOUNTER — Encounter (HOSPITAL_COMMUNITY): Payer: Self-pay | Admitting: *Deleted

## 2015-03-22 ENCOUNTER — Inpatient Hospital Stay (HOSPITAL_COMMUNITY)
Admission: AD | Admit: 2015-03-22 | Discharge: 2015-03-22 | Disposition: A | Discharge status: Home / Self Care | Payer: Self-pay | Source: Ambulatory Visit | Attending: Family Medicine | Admitting: Family Medicine

## 2015-03-22 DIAGNOSIS — N760 Acute vaginitis: Secondary | ICD-10-CM | POA: Insufficient documentation

## 2015-03-22 DIAGNOSIS — A499 Bacterial infection, unspecified: Secondary | ICD-10-CM

## 2015-03-22 DIAGNOSIS — B9689 Other specified bacterial agents as the cause of diseases classified elsewhere: Secondary | ICD-10-CM

## 2015-03-22 DIAGNOSIS — Z87891 Personal history of nicotine dependence: Secondary | ICD-10-CM | POA: Insufficient documentation

## 2015-03-22 DIAGNOSIS — G44209 Tension-type headache, unspecified, not intractable: Secondary | ICD-10-CM | POA: Insufficient documentation

## 2015-03-22 DIAGNOSIS — N946 Dysmenorrhea, unspecified: Secondary | ICD-10-CM

## 2015-03-22 DIAGNOSIS — B349 Viral infection, unspecified: Secondary | ICD-10-CM | POA: Insufficient documentation

## 2015-03-22 LAB — URINALYSIS, ROUTINE W REFLEX MICROSCOPIC
Bilirubin Urine: NEGATIVE
GLUCOSE, UA: NEGATIVE mg/dL
KETONES UR: NEGATIVE mg/dL
Leukocytes, UA: NEGATIVE
Nitrite: NEGATIVE
PH: 7 (ref 5.0–8.0)
Protein, ur: NEGATIVE mg/dL
Specific Gravity, Urine: 1.015 (ref 1.005–1.030)
Urobilinogen, UA: 0.2 mg/dL (ref 0.0–1.0)

## 2015-03-22 LAB — WET PREP, GENITAL
Trich, Wet Prep: NONE SEEN
YEAST WET PREP: NONE SEEN

## 2015-03-22 LAB — CBC WITH DIFFERENTIAL/PLATELET
Basophils Absolute: 0 10*3/uL (ref 0.0–0.1)
Basophils Relative: 1 %
Eosinophils Absolute: 0.1 10*3/uL (ref 0.0–0.7)
Eosinophils Relative: 2 %
HCT: 33.8 % — ABNORMAL LOW (ref 36.0–46.0)
HEMOGLOBIN: 11.5 g/dL — AB (ref 12.0–15.0)
LYMPHS ABS: 1.6 10*3/uL (ref 0.7–4.0)
Lymphocytes Relative: 45 %
MCH: 32.2 pg (ref 26.0–34.0)
MCHC: 34 g/dL (ref 30.0–36.0)
MCV: 94.7 fL (ref 78.0–100.0)
Monocytes Absolute: 0.4 10*3/uL (ref 0.1–1.0)
Monocytes Relative: 11 %
NEUTROS PCT: 41 %
Neutro Abs: 1.4 10*3/uL — ABNORMAL LOW (ref 1.7–7.7)
Platelets: 193 10*3/uL (ref 150–400)
RBC: 3.57 MIL/uL — AB (ref 3.87–5.11)
RDW: 11.9 % (ref 11.5–15.5)
WBC: 3.5 10*3/uL — AB (ref 4.0–10.5)

## 2015-03-22 LAB — URINE MICROSCOPIC-ADD ON

## 2015-03-22 LAB — POCT PREGNANCY, URINE: Preg Test, Ur: NEGATIVE

## 2015-03-22 MED ORDER — IBUPROFEN 800 MG PO TABS: 800.0000 mg | ORAL_TABLET | Freq: Three times a day (TID) | ORAL | Status: DC | PRN

## 2015-03-22 MED ORDER — METRONIDAZOLE 500 MG PO TABS: 500.0000 mg | ORAL_TABLET | Freq: Two times a day (BID) | ORAL | Status: DC

## 2015-03-22 MED ORDER — IBUPROFEN 800 MG PO TABS
800.0000 mg | ORAL_TABLET | Freq: Once | ORAL | Status: AC
  Administered 2015-03-22: 800 mg via ORAL
  Filled 2015-03-22: qty 1

## 2015-03-22 NOTE — MAU Provider Note (Signed)
Chief Complaint: Headache and Dysmenorrhea   First Provider Initiated Contact with Patient 03/22/15 2108      SUBJECTIVE HPI: Shari Davidson is a 24 y.o. G1P0010 female who presents to Maternity Admissions reporting: 1. Being late for her period. Hasn't Taken a pregnancy test. Had two periods in July. Is sexually active, but has not had unprotected IC since before LMP. She states she is in a mutually monogamous relationship. 2. Low abdominal cramping, low back cramping, chills and hot flashes  2 weeks. Hasn't taken temperature. 3. Headache today similar to headaches in the past but worse. 4. Vaginal discharge with odor.  Location: Low abd and low-mid back Quality: cramping Severity: 0/10 on pain scale now, 3/10 at worst Duration: 2 weeks Context: none Timing: intermittent Modifying factors: None. Hasn't tried anything for the pain. No recent injury or increased physical exertion. Associated signs and symptoms: Pos for nausea, chills, vaginal discharge w/ odor. Negative for vaginal bleeding, dyspareunia, vomiting, diarrhea, constipation or urinary complaints.  Location: Head--right temple Quality: Throbbing Severity: 10/10 on pain scale Duration: Less than 24 hours Context: None Timing: Constant Modifying factors: Seems related to stress. Hasn't tried anything to treat the pain. Associated signs and symptoms: Pos for nausea, chills. Negative for photophobia, phonophobia, difficulty with speech or gait, weakness, sinus congestion.  Past Medical History  Diagnosis Date  . Asthma   . UTI (lower urinary tract infection) 12/14/2010    10K E.coli on ER visit for abdominal pain  . Chlamydia 08/06/11    treated at Urgent Care  . Gonorrhea 04/20/12    treated at Urgent Care  . Migraine   . Trichomonas contact, treated    OB History  Gravida Para Term Preterm AB SAB TAB Ectopic Multiple Living  1 0   1 1    0    # Outcome Date GA Lbr Len/2nd Weight Sex Delivery Anes PTL Lv   1 SAB              Past Surgical History  Procedure Laterality Date  . No past surgeries     Social History   Social History  . Marital Status: Single    Spouse Name: N/A  . Number of Children: N/A  . Years of Education: N/A   Occupational History  . Not on file.   Social History Main Topics  . Smoking status: Former Smoker -- 0.30 packs/day    Quit date: 08/30/2014  . Smokeless tobacco: Not on file  . Alcohol Use: No     Comment: OCCASIONAL  . Drug Use: No  . Sexual Activity: Yes    Birth Control/ Protection: None   Other Topics Concern  . Not on file   Social History Narrative    Lives with mother and 2 sisters   No current facility-administered medications on file prior to encounter.   Current Outpatient Prescriptions on File Prior to Encounter  Medication Sig Dispense Refill  . albuterol (PROVENTIL HFA;VENTOLIN HFA) 108 (90 BASE) MCG/ACT inhaler Inhale 2 puffs into the lungs every 6 (six) hours as needed for wheezing or shortness of breath.    . misoprostol (CYTOTEC) 200 MCG tablet Place 4 tablets (800 mcg total) vaginally once. (Patient not taking: Reported on 03/22/2015) 4 tablet 1  . oxyCODONE-acetaminophen (PERCOCET/ROXICET) 5-325 MG per tablet Take 1-2 tablets by mouth every 6 (six) hours as needed. (Patient not taking: Reported on 03/22/2015) 20 tablet 0  . Prenatal Vit-Min-FA-Fish Oil ([employer] PRENATAL GUMMY) 0.4-113.5 MG CHEW Chew 1  tablet by mouth daily. (Patient not taking: Reported on 03/22/2015) 30 tablet 12  . promethazine (PHENERGAN) 25 MG tablet Take 1 tablet (25 mg total) by mouth every 6 (six) hours as needed for nausea or vomiting. (Patient not taking: Reported on 03/22/2015) 30 tablet 0   No Known Allergies  I have reviewed the past Medical Hx, Surgical Hx, Social Hx, Allergies and Medications.   Review of Systems  Constitutional: Positive for chills and fatigue. Negative for fever and diaphoresis.  HENT: Negative for congestion, ear pain,  rhinorrhea, sinus pressure and sore throat.   Eyes: Negative for photophobia and visual disturbance.  Respiratory: Negative for cough.   Gastrointestinal: Positive for nausea and abdominal pain. Negative for vomiting, diarrhea, constipation and blood in stool.  Endocrine:       Hot flashes  Genitourinary: Positive for vaginal discharge, menstrual problem and pelvic pain. Negative for dysuria, urgency, frequency, hematuria, flank pain, vaginal bleeding and dyspareunia.  Musculoskeletal: Positive for myalgias and back pain. Negative for gait problem, neck pain and neck stiffness.  Skin: Negative for rash.  Neurological: Positive for headaches. Negative for dizziness, syncope, facial asymmetry, speech difficulty, weakness and numbness.  Psychiatric/Behavioral: Negative for confusion.    OBJECTIVE Patient Vitals for the past 24 hrs:  BP Temp Temp src Pulse Resp Height Weight  03/22/15 2211 133/66 mmHg - - - - - -  03/22/15 2034 124/71 mmHg 98.5 F (36.9 C) Oral 79 18  (1.626 m) 117 lb (53.071 kg)   Constitutional: Well-developed, well-nourished, slender female in no acute distress.  Head: No congestion or rhinorrhea. Sinuses nontender. Grossly normal vision. No nuchal rigidity. Cardiovascular: normal rate Respiratory: normal rate and effort.  GI: Abd soft, non-tender. Pos BS x 4. MS: Extremities nontender, no edema, normal ROM. Back nontender. Normal range of motion. Neurologic: Alert and oriented x 4. Symmetric facial movement. Normal speech and gait. GU: Neg CVAT.  SPECULUM EXAM: NEFG, moderate amount of creamy, pink, malodorous discharge, scant blood noted, cervix clean  BIMANUAL: cervix closed; uterus normal size, no adnexal tenderness or masses. No CMT.  LAB RESULTS Results for orders placed or performed during the hospital encounter of 03/22/15 (from the past 24 hour(s))  Urinalysis, Routine w reflex microscopic (not at [facility])     Status: Abnormal   Collection Time: 03/22/15   8:25 PM  Result Value Ref Range   Color, Urine YELLOW YELLOW   APPearance CLEAR CLEAR   Specific Gravity, Urine 1.015 1.005 - 1.030   pH 7.0 5.0 - 8.0   Glucose, UA NEGATIVE NEGATIVE mg/dL   Hgb urine dipstick TRACE (A) NEGATIVE   Bilirubin Urine NEGATIVE NEGATIVE   Ketones, ur NEGATIVE NEGATIVE mg/dL   Protein, ur NEGATIVE NEGATIVE mg/dL   Urobilinogen, UA 0.2 0.0 - 1.0 mg/dL   Nitrite NEGATIVE NEGATIVE   Leukocytes, UA NEGATIVE NEGATIVE  Urine microscopic-add on     Status: None   Collection Time: 03/22/15  8:25 PM  Result Value Ref Range   Squamous Epithelial / LPF RARE RARE   WBC, UA 0-2 <3 WBC/hpf   RBC / HPF 0-2 <3 RBC/hpf   Bacteria, UA RARE RARE  Pregnancy, urine POC     Status: None   Collection Time: 03/22/15  8:29 PM  Result Value Ref Range   Preg Test, Ur NEGATIVE NEGATIVE  CBC with Differential/Platelet     Status: Abnormal   Collection Time: 03/22/15  8:44 PM  Result Value Ref Range   WBC 3.5 (L) 4.0 -  10.5 K/uL   RBC 3.57 (L) 3.87 - 5.11 MIL/uL   Hemoglobin 11.5 (L) 12.0 - 15.0 g/dL   HCT 16.1 (L) 09.6 - 04.5 %   MCV 94.7 78.0 - 100.0 fL   MCH 32.2 26.0 - 34.0 pg   MCHC 34.0 30.0 - 36.0 g/dL   RDW 40.9 81.1 - 91.4 %   Platelets 193 150 - 400 K/uL   Neutrophils Relative % 41 %   Neutro Abs 1.4 (L) 1.7 - 7.7 K/uL   Lymphocytes Relative 45 %   Lymphs Abs 1.6 0.7 - 4.0 K/uL   Monocytes Relative 11 %   Monocytes Absolute 0.4 0.1 - 1.0 K/uL   Eosinophils Relative 2 %   Eosinophils Absolute 0.1 0.0 - 0.7 K/uL   Basophils Relative 1 %   Basophils Absolute 0.0 0.0 - 0.1 K/uL  Wet prep, genital     Status: Abnormal   Collection Time: 03/22/15  8:55 PM  Result Value Ref Range   Yeast Wet Prep HPF POC NONE SEEN NONE SEEN   Trich, Wet Prep NONE SEEN NONE SEEN   Clue Cells Wet Prep HPF POC MODERATE (A) NONE SEEN   WBC, Wet Prep HPF POC FEW (A) NONE SEEN    IMAGING No results found.  MAU COURSE UA, UPT, GC/Chlamydia, Wet prep, CBC w/ dif, Ibuprofen 800 mg.    All pain resolved. Patient feeling much better.  MDM 24 year old nonpregnant female presents with multiple complaints most concerning is a severe headache that she rates 10/10 on pain scale. However, on exam patient is in no apparent distress, non-toxic appearing, has grossly normal neurological exam and is without fever or leukocytosis. Patient's pain resolved completely with ibuprofen. Headache most likely tension headache from starting second job and being under a lot of stress. Low suspicion for meningitis or emergent intracranial process.  Low abdominal and back cramping likely due to combination of dysmenorrhea and BV. Low suspicion for PID due to absence of cervical motion tenderness, fever or leukocytosis.  Chills, body aches and nausea may be due to a viral syndrome.   ASSESSMENT 1. BV (bacterial vaginosis)   2. Viral syndrome   3. Tension headache     PLAN Discharge home in stable condition. Headache ReFlex reviewed. Discussed stress reduction and comfort measures for headaches. Rx Flagyl and ibuprofen.     Follow-up Information    Follow up with [facility]    .   Why:  As needed if symptoms worsen   Contact information:   [address] [id number] [phone]       Medication List    STOP taking these medications        [employer]S PRENATAL GUMMY 0.4-113.5 MG Chew     misoprostol 200 MCG tablet  Commonly known as:  CYTOTEC     oxyCODONE-acetaminophen 5-325 MG per tablet  Commonly known as:  PERCOCET/ROXICET     promethazine 25 MG tablet  Commonly known as:  PHENERGAN      TAKE these medications        albuterol 108 (90 BASE) MCG/ACT inhaler  Commonly known as:  PROVENTIL HFA;VENTOLIN HFA  Inhale 2 puffs into the lungs every 6 (six) hours as needed for wheezing or shortness of breath.     ibuprofen 800 MG tablet  Commonly known as:  ADVIL,MOTRIN  Take 1 tablet (800 mg total) by mouth every 8  (eight) hours as needed for headache, moderate pain or  cramping.     metroNIDAZOLE 500 MG tablet  Commonly known as:  FLAGYL  Take 1 tablet (500 mg total) by mouth 2 (two) times daily.       [place], [place] 03/22/2015  10:17 PM

## 2015-03-22 NOTE — MAU Note (Signed)
Was treat about a month and half ago with flagyl for bacterial infection. Finished the medication.  Had 2 periods in August 8/5- 8/11 then  8/23- 8/29.  No cycle this month and not having protected sex with partner. Has been having body aches, chills, cramping and hot flashes x 2 weeks. Recent miscarriage April 2016. No other pregnancies

## 2015-03-22 NOTE — Discharge Instructions (Signed)
Bacterial Vaginosis Bacterial vaginosis is a vaginal infection that occurs when the normal balance of bacteria in the vagina is disrupted. It results from an overgrowth of certain bacteria. This is the most common vaginal infection in women of childbearing age. Treatment is important to prevent complications, especially in pregnant women, as it can cause a premature delivery. CAUSES  Bacterial vaginosis is caused by an increase in harmful bacteria that are normally present in smaller amounts in the vagina. Several different kinds of bacteria can cause bacterial vaginosis. However, the reason that the condition develops is not fully understood. RISK FACTORS Certain activities or behaviors can put you at an increased risk of developing bacterial vaginosis, including:  Having a new sex partner or multiple sex partners.  Douching.  Using an intrauterine device (IUD) for contraception. Women do not get bacterial vaginosis from toilet seats, bedding, swimming pools, or contact with objects around them. SIGNS AND SYMPTOMS  Some women with bacterial vaginosis have no signs or symptoms. Common symptoms include:  Grey vaginal discharge.  A fishlike odor with discharge, especially after sexual intercourse.  Itching or burning of the vagina and vulva.  Burning or pain with urination. DIAGNOSIS  Your health care provider will take a medical history and examine the vagina for signs of bacterial vaginosis. A sample of vaginal fluid may be taken. Your health care provider will look at this sample under a microscope to check for bacteria and abnormal cells. A vaginal pH test may also be done.  TREATMENT  Bacterial vaginosis may be treated with antibiotic medicines. These may be given in the form of a pill or a vaginal cream. A second round of antibiotics may be prescribed if the condition comes back after treatment.  HOME CARE INSTRUCTIONS   Only take over-the-counter or prescription medicines as  directed by your health care provider.  If antibiotic medicine was prescribed, take it as directed. Make sure you finish it even if you start to feel better.  Do not have sex until treatment is completed.  Tell all sexual partners that you have a vaginal infection. They should see their health care provider and be treated if they have problems, such as a mild rash or itching.  Practice safe sex by using condoms and only having one sex partner. SEEK MEDICAL CARE IF:   Your symptoms are not improving after 3 days of treatment.  You have increased discharge or pain.  You have a fever. MAKE SURE YOU:   Understand these instructions.  Will watch your condition.  Will get help right away if you are not doing well or get worse. FOR MORE INFORMATION  Centers for Disease Control and Prevention, Division of STD Prevention: SolutionApps.co.za American Sexual Health Association (ASHA): www.ashastd.org  Document Released: 06/15/2005 Document Revised: 04/05/2013 Document Reviewed: 01/25/2013 [facility] Patient Information 2015 [place], [place]. This information is not intended to replace advice given to you by your health care provider. Make sure you discuss any questions you have with your health care provider.  Tension Headache A tension headache is a feeling of pain, pressure, or aching often felt over the front and sides of the head. The pain can be dull or can feel tight (constricting). It is the most common type of headache. Tension headaches are not normally associated with nausea or vomiting and do not get worse with physical activity. Tension headaches can last 30 minutes to several days.  CAUSES  The exact cause is not known, but it may be caused by chemicals  and hormones in the brain that lead to pain. Tension headaches often begin after stress, anxiety, or depression. Other triggers may include:  Alcohol.  Caffeine (too much or withdrawal).  Respiratory infections (colds, flu, sinus  infections).  Dental problems or teeth clenching.  Fatigue.  Holding your head and neck in one position too long while using a computer. SYMPTOMS   Pressure around the head.   Dull, aching head pain.   Pain felt over the front and sides of the head.   Tenderness in the muscles of the head, neck, and shoulders. DIAGNOSIS  A tension headache is often diagnosed based on:   Symptoms.   Physical examination.   A CT scan or MRI of your head. These tests may be ordered if symptoms are severe or unusual. TREATMENT  Medicines may be given to help relieve symptoms.  HOME CARE INSTRUCTIONS   Only take over-the-counter or prescription medicines for pain or discomfort as directed by your caregiver.   Lie down in a dark, quiet room when you have a headache.   Keep a journal to find out what may be triggering your headaches. For example, write down:  What you eat and drink.  How much sleep you get.  Any change to your diet or medicines.  Try massage or other relaxation techniques.   Ice packs or heat applied to the head and neck can be used. Use these 3 to 4 times per day for 15 to 20 minutes each time, or as needed.   Limit stress.   Sit up straight, and do not tense your muscles.   Quit smoking if you smoke.  Limit alcohol use.  Decrease the amount of caffeine you drink, or stop drinking caffeine.  Eat and exercise regularly.  Get 7 to 9 hours of sleep, or as recommended by your caregiver.  Avoid excessive use of pain medicine as recurrent headaches can occur.  SEEK MEDICAL CARE IF:   You have problems with the medicines you were prescribed.  Your medicines do not work.  You have a change from the usual headache.  You have nausea or vomiting. SEEK IMMEDIATE MEDICAL CARE IF:   Your headache becomes severe.  You have a fever.  You have a stiff neck.  You have loss of vision.  You have muscular weakness or loss of muscle control.  You lose  your balance or have trouble walking.  You feel faint or pass out.  You have severe symptoms that are different from your first symptoms. MAKE SURE YOU:   Understand these instructions.  Will watch your condition.  Will get help right away if you are not doing well or get worse. Document Released: 06/15/2005 Document Revised: 09/07/2011 Document Reviewed: 06/05/2011 [facility] Patient Information 2015 [place], [place]. This information is not intended to replace advice given to you by your health care provider. Make sure you discuss any questions you have with your health care provider.

## 2015-03-23 LAB — HIV ANTIBODY (ROUTINE TESTING W REFLEX): HIV Screen 4th Generation wRfx: NONREACTIVE

## 2015-03-25 LAB — GC/CHLAMYDIA PROBE AMP (~~LOC~~) NOT AT ARMC
CHLAMYDIA, DNA PROBE: NEGATIVE
Neisseria Gonorrhea: NEGATIVE

## 2015-04-02 ENCOUNTER — Encounter (HOSPITAL_COMMUNITY): Payer: Self-pay

## 2015-04-02 ENCOUNTER — Emergency Department (HOSPITAL_COMMUNITY)
Admission: EM | Admit: 2015-04-02 | Discharge: 2015-04-02 | Disposition: A | Discharge status: Home / Self Care | Payer: Self-pay | Attending: Emergency Medicine | Admitting: Emergency Medicine

## 2015-04-02 DIAGNOSIS — Z8619 Personal history of other infectious and parasitic diseases: Secondary | ICD-10-CM | POA: Insufficient documentation

## 2015-04-02 DIAGNOSIS — Z3202 Encounter for pregnancy test, result negative: Secondary | ICD-10-CM | POA: Insufficient documentation

## 2015-04-02 DIAGNOSIS — Z87440 Personal history of urinary (tract) infections: Secondary | ICD-10-CM | POA: Insufficient documentation

## 2015-04-02 DIAGNOSIS — Z79899 Other long term (current) drug therapy: Secondary | ICD-10-CM | POA: Insufficient documentation

## 2015-04-02 DIAGNOSIS — Z87891 Personal history of nicotine dependence: Secondary | ICD-10-CM | POA: Insufficient documentation

## 2015-04-02 DIAGNOSIS — J45909 Unspecified asthma, uncomplicated: Secondary | ICD-10-CM | POA: Insufficient documentation

## 2015-04-02 DIAGNOSIS — F331 Major depressive disorder, recurrent, moderate: Secondary | ICD-10-CM

## 2015-04-02 DIAGNOSIS — F32A Depression, unspecified: Secondary | ICD-10-CM

## 2015-04-02 DIAGNOSIS — T483X2A Poisoning by antitussives, intentional self-harm, initial encounter: Secondary | ICD-10-CM | POA: Insufficient documentation

## 2015-04-02 DIAGNOSIS — F329 Major depressive disorder, single episode, unspecified: Secondary | ICD-10-CM | POA: Insufficient documentation

## 2015-04-02 DIAGNOSIS — Z8679 Personal history of other diseases of the circulatory system: Secondary | ICD-10-CM | POA: Insufficient documentation

## 2015-04-02 DIAGNOSIS — F1210 Cannabis abuse, uncomplicated: Secondary | ICD-10-CM | POA: Insufficient documentation

## 2015-04-02 LAB — CBC
HCT: 38.4 % (ref 36.0–46.0)
HEMOGLOBIN: 13.2 g/dL (ref 12.0–15.0)
MCH: 32.5 pg (ref 26.0–34.0)
MCHC: 34.4 g/dL (ref 30.0–36.0)
MCV: 94.6 fL (ref 78.0–100.0)
Platelets: 252 10*3/uL (ref 150–400)
RBC: 4.06 MIL/uL (ref 3.87–5.11)
RDW: 11.8 % (ref 11.5–15.5)
WBC: 4.4 10*3/uL (ref 4.0–10.5)

## 2015-04-02 LAB — COMPREHENSIVE METABOLIC PANEL
ALBUMIN: 4.9 g/dL (ref 3.5–5.0)
ALT: 13 U/L — AB (ref 14–54)
AST: 20 U/L (ref 15–41)
Alkaline Phosphatase: 54 U/L (ref 38–126)
Anion gap: 9 (ref 5–15)
BUN: 12 mg/dL (ref 6–20)
CHLORIDE: 107 mmol/L (ref 101–111)
CO2: 25 mmol/L (ref 22–32)
CREATININE: 0.72 mg/dL (ref 0.44–1.00)
Calcium: 9.6 mg/dL (ref 8.9–10.3)
GFR calc Af Amer: >60 mL/min (ref >60)
GFR calc non Af Amer: >60 mL/min (ref >60)
GLUCOSE: 91 mg/dL (ref 65–99)
Potassium: 4.1 mmol/L (ref 3.5–5.1)
SODIUM: 141 mmol/L (ref 135–145)
Total Bilirubin: 0.5 mg/dL (ref 0.3–1.2)
Total Protein: 8.2 g/dL — ABNORMAL HIGH (ref 6.5–8.1)

## 2015-04-02 LAB — RAPID URINE DRUG SCREEN, HOSP PERFORMED
AMPHETAMINES: NOT DETECTED
Barbiturates: NOT DETECTED
Benzodiazepines: NOT DETECTED
Cocaine: NOT DETECTED
OPIATES: NOT DETECTED
TETRAHYDROCANNABINOL: POSITIVE — AB

## 2015-04-02 LAB — SALICYLATE LEVEL

## 2015-04-02 LAB — POC URINE PREG, ED: PREG TEST UR: NEGATIVE

## 2015-04-02 LAB — ACETAMINOPHEN LEVEL: Acetaminophen (Tylenol), Serum: <10 ug/mL — ABNORMAL LOW (ref 10–30)

## 2015-04-02 LAB — CBG MONITORING, ED: Glucose-Capillary: 105 mg/dL — ABNORMAL HIGH (ref 65–99)

## 2015-04-02 LAB — ETHANOL: Alcohol, Ethyl (B): <5 mg/dL (ref <5)

## 2015-04-02 NOTE — ED Notes (Signed)
Poison Control called following up on pt.  Stated that they are closing out pt's case at this time.

## 2015-04-02 NOTE — ED Notes (Signed)
Bed: WA31 Expected date:  Expected time:  Means of arrival:  Comments: 

## 2015-04-02 NOTE — ED Notes (Signed)
Report given to [facility], Charity fundraiser. Care relinquished.

## 2015-04-02 NOTE — ED Notes (Signed)
Patient left a suicide note

## 2015-04-02 NOTE — Consult Note (Signed)
[facility] [facility] Consult   Reason for Consult:  Suicidal ideation Referring Physician:  EDP Patient Identification: Shari Davidson MRN:  [id number] Principal Diagnosis: major depression, recurrent, moderate Diagnosis:   Patient Active Problem List   Diagnosis Date Noted  . Vaginal yeast infection [B37.3] 04/18/2013  . Difficulty urinating [R39.198] 04/18/2013  . Dysuria [R30.0] 08/09/2012  . UTI (lower urinary tract infection) [N39.0] 10/22/2011  . Possible exposure to STD [Z20.2] 08/27/2011  . Dysfunctional uterine bleeding [N93.8] 08/27/2011  . Contraception management [Z30.9] 06/03/2011  . Vaginal bleeding [N93.9] 05/05/2011  . Lightheadedness [R42] 03/19/2011  . CONSTIPATION [K59.00] 07/28/2010  . ACNE, MILD [L70.8] 07/28/2010  . MYOPIA [H52.10] 07/08/2007  . MIGRAINE, UNSPEC., W/O INTRACTABLE MIGRAINE [G43.909] 08/26/2006  . RHINITIS, ALLERGIC [J30.9] 08/26/2006  . ASTHMA, INTERMITTENT [J45.909] 08/26/2006    Total Time spent with patient: 45 minutes  Subjective:   Shari Davidson is a 24 y.o. female patient admitted with suicidal thoughts.  HPI:  Shari Davidson says she has been doing well in spite of her stressors.  She works 2 jobs, has financial problems, trouble with her friends and boyfriend who seem to be pulling away from her.  Says when stressed she tends to withdraw into herself.  This time she let it go too far and she felt overwhelmed yesterday.  Wrote a note describing how she could not take it any longer , how it seemed nobody understood her or loved her and she would be better off dead.  Was not even feeling depressed before she said till yesterday.  Today she does not feel suicidal, has no desire to end it all and wants to go home.  Has a supportive family.  We talked to her mother who confirmed her story and said she needed professional counseling.  Shari Davidson agrees she needs and wants help but does not want to be in the hospital.  She  has no psychiatric history and no previous attempts at killing herself.  Past Psychiatric History: none  Risk to Self: Is patient at risk for suicide?: Yes Risk to Others:   Prior Inpatient Therapy:   Prior Outpatient Therapy:    Past Medical History:  Past Medical History  Diagnosis Date  . Asthma   . UTI (lower urinary tract infection) 12/14/2010    10K E.coli on ER visit for abdominal pain  . Chlamydia 08/06/11    treated at Urgent Care  . Gonorrhea 04/20/12    treated at Urgent Care  . Migraine   . Trichomonas contact, treated     Past Surgical History  Procedure Laterality Date  . No past surgeries     Family History:  Family History  Problem Relation Age of Onset  . Hypertension Other    Family Psychiatric  History: none Social History:  History  Alcohol Use No    Comment: OCCASIONAL     History  Drug Use No    Social History   Social History  . Marital Status: Single    Spouse Name: N/A  . Number of Children: N/A  . Years of Education: N/A   Social History Main Topics  . Smoking status: Former Smoker -- 0.30 packs/day    Types: Cigarettes    Quit date: 08/30/2014  . Smokeless tobacco: Never Used  . Alcohol Use: No     Comment: OCCASIONAL  . Drug Use: No  . Sexual Activity: Yes    Birth Control/ Protection: None   Other Topics Concern  .  None   Social History Narrative    Lives with mother and 2 sisters   Additional Social History:                          Allergies:  No Known Allergies  Labs:  Results for orders placed or performed during the hospital encounter of 04/02/15 (from the past 48 hour(s))  Comprehensive metabolic panel     Status: Abnormal   Collection Time: 04/02/15  6:45 AM  Result Value Ref Range   Sodium 141 135 - 145 mmol/L   Potassium 4.1 3.5 - 5.1 mmol/L   Chloride 107 101 - 111 mmol/L   CO2 25 22 - 32 mmol/L   Glucose, Bld 91 65 - 99 mg/dL   BUN 12 6 - 20 mg/dL   Creatinine, Ser 1.48 0.44 - 1.00 mg/dL    Calcium 9.6 8.9 - 61.8 mg/dL   Total Protein 8.2 (H) 6.5 - 8.1 g/dL   Albumin 4.9 3.5 - 5.0 g/dL   AST 20 15 - 41 U/L   ALT 13 (L) 14 - 54 U/L   Alkaline Phosphatase 54 38 - 126 U/L   Total Bilirubin 0.5 0.3 - 1.2 mg/dL   GFR calc non Af Amer >60 >60 mL/min   GFR calc Af Amer >60 >60 mL/min    Comment: (NOTE) The eGFR has been calculated using the CKD EPI equation. This calculation has not been validated in all clinical situations. eGFR's persistently <60 mL/min signify possible Chronic Kidney Disease.    Anion gap 9 5 - 15  Ethanol (ETOH)     Status: None   Collection Time: 04/02/15  6:45 AM  Result Value Ref Range   Alcohol, Ethyl (B) <5 <5 mg/dL    Comment:        LOWEST DETECTABLE LIMIT FOR SERUM ALCOHOL IS 5 mg/dL FOR MEDICAL PURPOSES ONLY   Salicylate level     Status: None   Collection Time: 04/02/15  6:45 AM  Result Value Ref Range   Salicylate Lvl <4.0 2.8 - 30.0 mg/dL  Acetaminophen level     Status: Abnormal   Collection Time: 04/02/15  6:45 AM  Result Value Ref Range   Acetaminophen (Tylenol), Serum <10 (L) 10 - 30 ug/mL    Comment:        THERAPEUTIC CONCENTRATIONS VARY SIGNIFICANTLY. A RANGE OF 10-30 ug/mL MAY BE AN EFFECTIVE CONCENTRATION FOR MANY PATIENTS. HOWEVER, SOME ARE BEST TREATED AT CONCENTRATIONS OUTSIDE THIS RANGE. ACETAMINOPHEN CONCENTRATIONS >150 ug/mL AT 4 HOURS AFTER INGESTION AND >50 ug/mL AT 12 HOURS AFTER INGESTION ARE OFTEN ASSOCIATED WITH TOXIC REACTIONS.   CBC     Status: None   Collection Time: 04/02/15  6:45 AM  Result Value Ref Range   WBC 4.4 4.0 - 10.5 K/uL   RBC 4.06 3.87 - 5.11 MIL/uL   Hemoglobin 13.2 12.0 - 15.0 g/dL   HCT 05.4 57.0 - 85.5 %   MCV 94.6 78.0 - 100.0 fL   MCH 32.5 26.0 - 34.0 pg   MCHC 34.4 30.0 - 36.0 g/dL   RDW 11.0 55.8 - 21.0 %   Platelets 252 150 - 400 K/uL  CBG monitoring, ED     Status: Abnormal   Collection Time: 04/02/15  6:51 AM  Result Value Ref Range   Glucose-Capillary 105 (H) 65 -  99 mg/dL  Urine rapid drug screen (hosp performed) (Not at [facility])     Status: Abnormal  Collection Time: 04/02/15  8:22 AM  Result Value Ref Range   Opiates NONE DETECTED NONE DETECTED   Cocaine NONE DETECTED NONE DETECTED   Benzodiazepines NONE DETECTED NONE DETECTED   Amphetamines NONE DETECTED NONE DETECTED   Tetrahydrocannabinol POSITIVE (A) NONE DETECTED   Barbiturates NONE DETECTED NONE DETECTED    Comment:        DRUG SCREEN FOR MEDICAL PURPOSES ONLY.  IF CONFIRMATION IS NEEDED FOR ANY PURPOSE, NOTIFY LAB WITHIN 5 DAYS.        LOWEST DETECTABLE LIMITS FOR URINE DRUG SCREEN Drug Class       Cutoff (ng/mL) Amphetamine      1000 Barbiturate      200 Benzodiazepine   811 Tricyclics       914 Opiates          300 Cocaine          300 THC              50   POC urine preg, ED (not at [facility])     Status: None   Collection Time: 04/02/15  8:23 AM  Result Value Ref Range   Preg Test, Ur NEGATIVE NEGATIVE    Comment:        THE SENSITIVITY OF THIS METHODOLOGY IS >24 mIU/mL     No current facility-administered medications for this encounter.   Current Outpatient Prescriptions  Medication Sig Dispense Refill  . albuterol (PROVENTIL HFA;VENTOLIN HFA) 108 (90 BASE) MCG/ACT inhaler Inhale 2 puffs into the lungs every 6 (six) hours as needed for wheezing or shortness of breath.    . Pseudoeph-Bromphen-DM (COLD & COUGH DM PO) Take 0.5 Bottles by mouth once.    Marland Kitchen ibuprofen (ADVIL,MOTRIN) 800 MG tablet Take 1 tablet (800 mg total) by mouth every 8 (eight) hours as needed for headache, moderate pain or cramping. 3 tablet 1  . metroNIDAZOLE (FLAGYL) 500 MG tablet Take 1 tablet (500 mg total) by mouth 2 (two) times daily. 14 tablet 0    Musculoskeletal: Strength & Muscle Tone: within normal limits Gait & Station: normal Patient leans: N/A  Psychiatric Specialty Exam: ROS  Blood pressure 121/74, pulse 89, temperature 98.2 F (36.8 C), temperature source Oral, resp. rate 18, height  $Remov'5\' 4"'FGSCez$  (1.626 m), weight 54.432 kg (120 lb), last menstrual period 03/23/2015, SpO2 100 %, unknown if currently breastfeeding.Body mass index is 20.59 kg/(m^2).  General Appearance: Fairly Groomed  Engineer, water::  Good  Speech:  Clear and Coherent  Volume:  Normal  Mood:  Depressed  Affect:  Congruent  Thought Process:  Coherent  Orientation:  Full (Time, Place, and Person)  Thought Content:  Negative  Suicidal Thoughts:  No  Homicidal Thoughts:  No  Memory:  Immediate;   Good Recent;   Good Remote;   Good  Judgement:  Good  Insight:  Good  Psychomotor Activity:  Normal  Concentration:  Good  Recall:  Good  Fund of Knowledge:Good  Language: Good  Akathisia:  Negative  Handed:  Right  AIMS (if indicated):     Assets:  Communication Skills Desire for Improvement Financial Resources/Insurance Housing Intimacy Leisure Time [place] Talents/Skills Transportation Vocational/Educational  ADL's:  Intact  Cognition: WNL  Sleep:      Treatment Plan Summary: discharge home to be followed by outpatient psychiatrist and therapist.  She will pursue the appointments  Disposition: No evidence of imminent risk to self or others at present.    Donnelly Angelica 04/02/2015 12:06  PM

## 2015-04-02 NOTE — ED Notes (Signed)
Bed: RESB Expected date:  Expected time:  Means of arrival:  Comments: EMS 24 yo female overdose

## 2015-04-02 NOTE — ED Notes (Signed)
Patient and mother adamant about not removing piercing. The plan of care, process, and options were again explained to the patient and her mother. The involuntary commitment process was also explained to the patient and her mother. Patient and mother verbalized understanding of the necessary processes and agreed to cooperate with the process.

## 2015-04-02 NOTE — ED Notes (Signed)
Pt. Belongings were placed in locker numberr [phone]. Nurse was notified.

## 2015-04-02 NOTE — BH Assessment (Signed)
Per request of Dr. Ladona Ridgel and Julieanne Cotton, NP, contacted patient's mother Shari Davidson) [phone] for collateral information. 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 Mom seems baffled about patient being in such a situation. Sts that patient does suffer from depression but she wasn't aware that patient felt so bad. Patient told her mother last night that she was stressed. Mom not aware of patient's stressors. Mom tried to offer patient last night and offer encouragement. Sts that patient made comments such as "Life is not fair to me". Patient cotinued offering support and sensed that patient felt better. Sts that patient resides in her household.  Mom sts that after speaking with her daughter last night they both went to bed and things were peaceful. Mom sts that when she woke up this morning EMS was at her door. Mom is not sure who called EMS. Sts that EMS took her [facility].   Mom denies patient referencing wanting to end her life recently or in the past. She continues to state that patient only spoke of her depression.  Denies patient stating that she wants to harm others. Patient does not exhibit any signs or symptoms of AVH's. Sts that patient does not have a history of seeking help from psychiatrist or therapist.  Denies patient having a history of inpatient psychiatric treatment. No alcohol and drug use noted.  Dr. Ladona Ridgel and Julieanne Cotton, NP recommend discharge home. Mom feels safe with patient returning home. Patient's brother who is present in her room agrees to transport patient home. Patient was also given resources to [facility] for follow outpatient therapy. She was also given information for mobile crises.

## 2015-04-02 NOTE — ED Notes (Signed)
Pt given behavioral resources. AVS explained in detail. Ambulatory with steady gait. No other c/c. Brother taking patient home. A&Ox4. All belongings with patient upon discharge.

## 2015-04-02 NOTE — BHH Suicide Risk Assessment (Signed)
[facility] Discharge Suicide Risk Assessment   Demographic Factors:  Adolescent or young adult  Total Time spent with patient: 45 minutes  Musculoskeletal: Strength & Muscle Tone: within normal limits Gait & Station: normal Patient leans: N/A  Psychiatric Specialty Exam: Physical Exam  ROS  Blood pressure 121/74, pulse 89, temperature 98.2 F (36.8 C), temperature source Oral, resp. rate 18, height  (1.626 m), weight 54.432 kg (120 lb), last menstrual period 03/23/2015, SpO2 100 %, unknown if currently breastfeeding.Body mass index is 20.59 kg/(m^2).  General Appearance: Casual  Eye Contact::  Good  Speech:  Clear and Coherent409  Volume:  Normal  Mood:  Depressed  Affect:  Congruent  Thought Process:  Coherent  Orientation:  Full (Time, Place, and Person)  Thought Content:  Negative  Suicidal Thoughts:  No  Homicidal Thoughts:  No  Memory:  Immediate;   Good Recent;   Good Remote;   Good  Judgement:  Good  Insight:  Good  Psychomotor Activity:  Normal  Concentration:  Good  Recall:  Good  Fund of Knowledge:Good  Language: Good  Akathisia:  Negative  Handed:  Right  AIMS (if indicated):     Assets:  Communication Skills Desire for Improvement Financial Resources/Insurance Housing Intimacy Leisure Time Physical Health Resilience Social Support Talents/Skills Transportation Vocational/Educational  Sleep:     Cognition: WNL  ADL's:  Intact      Has this patient used any form of tobacco in the last 30 days? (Cigarettes, Smokeless Tobacco, Cigars, and/or Pipes) No  Mental Status Per Nursing Assessment::   On Admission:     Current Mental Status by Physician: NA  Loss Factors: NA  Historical Factors: NA  Risk Reduction Factors:   Sense of responsibility to family, Employed, Living with another person, especially a relative and Positive social support  Continued Clinical Symptoms:  depression  Cognitive Features That Contribute To Risk:  None     Suicide Risk:  Minimal: No identifiable suicidal ideation.  Patients presenting with no risk factors but with morbid ruminations; may be classified as minimal risk based on the severity of the depressive symptoms  Principal Problem: <principal problem not specified> Discharge Diagnoses:  Patient Active Problem List   Diagnosis Date Noted  . Vaginal yeast infection [B37.3] 04/18/2013  . Difficulty urinating [R39.198] 04/18/2013  . Dysuria [R30.0] 08/09/2012  . UTI (lower urinary tract infection) [N39.0] 10/22/2011  . Possible exposure to STD [Z20.2] 08/27/2011  . Dysfunctional uterine bleeding [N93.8] 08/27/2011  . Contraception management [Z30.9] 06/03/2011  . Vaginal bleeding [N93.9] 05/05/2011  . Lightheadedness [R42] 03/19/2011  . CONSTIPATION [K59.00] 07/28/2010  . ACNE, MILD [L70.8] 07/28/2010  . MYOPIA [H52.10] 07/08/2007  . MIGRAINE, UNSPEC., W/O INTRACTABLE MIGRAINE [G43.909] 08/26/2006  . RHINITIS, ALLERGIC [J30.9] 08/26/2006  . ASTHMA, INTERMITTENT [J45.909] 08/26/2006      Plan Of Care/Follow-up recommendations:  Activity:  continue usual activity Diet:  continue usual diet  Is patient on multiple antipsychotic therapies at discharge:  No   Has Patient had three or more failed trials of antipsychotic monotherapy by history:  No  Recommended Plan for Multiple Antipsychotic Therapies: NA    Carolanne Grumbling 04/02/2015, 12:23 PM

## 2015-04-02 NOTE — ED Notes (Signed)
Patient arrives by EMS with complaints of overdose on cough syrup with Suicidal intent.  Drank 1/2 bottle cough syrup at 0200.  EMS did not bring in bottle.

## 2015-04-02 NOTE — ED Provider Notes (Signed)
CSN: [id number]     Arrival date & time 04/02/15  4098 History   First MD Initiated Contact with Patient 04/02/15 [phone]     Chief Complaint  Patient presents with  . Suicidal  . Drug Overdose     (Consider location/radiation/quality/duration/timing/severity/associated sxs/prior Treatment) HPI  Shari Davidson is a(n) 24 y.o. female who presents to the ED after intentional overdose and suicide attempt. Patient states she drank half a bottle of generic cough syrup DM for [facility] at 2 AM. She states she was trying to hurt herself. She states she has been depressed rec lately. She has never attempted suicide prior. She brings in a handwritten note expressing her lack of interest in continuing living any longer. She denies any other alcohol or drug use, homicidal ideation or audiovisual hallucinations. She has a past medical history of asthma but denies any other significant past medical history. She denies fever, myalgias, nausea, vomiting, headaches, urinary urgency.  Past Medical History  Diagnosis Date  . Asthma   . UTI (lower urinary tract infection) 12/14/2010    10K E.coli on ER visit for abdominal pain  . Chlamydia 08/06/11    treated at Urgent Care  . Gonorrhea 04/20/12    treated at Urgent Care  . Migraine   . Trichomonas contact, treated    Past Surgical History  Procedure Laterality Date  . No past surgeries     Family History  Problem Relation Age of Onset  . Hypertension Other    Social History  Substance Use Topics  . Smoking status: Former Smoker -- 0.30 packs/day    Types: Cigarettes    Quit date: 08/30/2014  . Smokeless tobacco: Never Used  . Alcohol Use: No     Comment: OCCASIONAL   OB History    Gravida Para Term Preterm AB TAB SAB Ectopic Multiple Living   1 0   1  1   0     Review of Systems   Ten systems reviewed and are negative for acute change, except as noted in the HPI.   Allergies  Review of patient's allergies indicates no known  allergies.  Home Medications   Prior to Admission medications   Medication Sig Start Date End Date Taking? Authorizing Provider  albuterol (PROVENTIL HFA;VENTOLIN HFA) 108 (90 BASE) MCG/ACT inhaler Inhale 2 puffs into the lungs every 6 (six) hours as needed for wheezing or shortness of breath.   Yes Historical Provider, MD  ibuprofen (ADVIL,MOTRIN) 800 MG tablet Take 1 tablet (800 mg total) by mouth every 8 (eight) hours as needed for headache, moderate pain or cramping. 03/22/15   Dorathy Kinsman, CNM   BP 155/83 mmHg  Pulse 102  Temp(Src) 98.2 F (36.8 C) (Oral)  Resp 17  Ht  (1.626 m)  Wt 120 lb (54.432 kg)  BMI 20.59 kg/m2  SpO2 100%  LMP 03/23/2015 (Exact Date) Physical Exam Physical Exam  Nursing note and vitals reviewed. Constitutional: She is oriented to person, place, and time. She appears well-developed and well-nourished. No distress. Patient is tearful. HENT:  Head: Normocephalic and atraumatic.  Eyes: Conjunctivae normal and EOM are normal. Pupils are equal, round, and reactive to light. No scleral icterus.  Neck: Normal range of motion.  Cardiovascular: Normal rate, regular rhythm and normal heart sounds.  Exam reveals no gallop and no friction rub.   No murmur heard. Pulmonary/Chest: Effort normal and breath sounds normal. No respiratory distress.  Abdominal: Soft. Bowel sounds are normal. She exhibits no distension  and no mass. There is no tenderness. There is no guarding.  Neurological: She is alert and oriented to person, place, and time.  Skin: Skin is warm and dry. She is not diaphoretic.    ED Course  Procedures (including critical care time) Labs Review Labs Reviewed  COMPREHENSIVE METABOLIC PANEL - Abnormal; Notable for the following:    Total Protein 8.2 (*)    ALT 13 (*)    All other components within normal limits  ACETAMINOPHEN LEVEL - Abnormal; Notable for the following:    Acetaminophen (Tylenol), Serum <10 (*)    All other components  within normal limits  URINE RAPID DRUG SCREEN, HOSP PERFORMED - Abnormal; Notable for the following:    Tetrahydrocannabinol POSITIVE (*)    All other components within normal limits  CBG MONITORING, ED - Abnormal; Notable for the following:    Glucose-Capillary 105 (*)    All other components within normal limits  ETHANOL  SALICYLATE LEVEL  CBC  POC URINE PREG, ED    Imaging Review No results found. I have personally reviewed and evaluated these images and lab results as part of my medical decision-making.   EKG Interpretation   Date/Time:  Tuesday April 02 2015 06:48:05 EDT Ventricular Rate:  87 PR Interval:  136 QRS Duration: 80 QT Interval:  356 QTC Calculation: 428 R Axis:   79 Text Interpretation:  Sinus rhythm Confirmed by [facility]  MD, APRIL  ([id number]) on 04/02/2015 6:49:58 AM      MDM   Final diagnoses:  Depressed   8:51 AM BP 155/83 mmHg  Pulse 102  Temp(Src) 98.2 F (36.8 C) (Oral)  Resp 17  Ht  (1.626 m)  Wt 120 lb (54.432 kg)  BMI 20.59 kg/m2  SpO2 100%  LMP 03/23/2015 (Exact Date)  Patient here for intentional overdoses and self-harm. I contacted [facility].  Patient will need 6 hour obs from onset. (8:00 AM) Labs pending.   8:51 AM BP 155/83 mmHg  Pulse 102  Temp(Src) 98.2 F (36.8 C) (Oral)  Resp 17  Ht  (1.626 m)  Wt 120 lb (54.432 kg)  BMI 20.59 kg/m2  SpO2 100%  LMP 03/23/2015 (Exact Date) Patient with suicidal overdose with intention to commit suicide. Labs are reassuring, normal EKG. I placed consult to TTS.  Arthor Captain, PA-C 04/04/15 [id number]  April Palumbo, MD 04/05/15 [phone]

## 2015-04-02 NOTE — ED Notes (Signed)
Pt's mother was informed of pt's status and plan of care, in consultation room in triage.  Mother asking to see pt.  RN allowed mother to come back to see pt.  Pt in bathroom at this time.

## 2015-04-02 NOTE — ED Notes (Signed)
Pt's sister came out to desk, informing her that is she leaves that she will have to wait until 9am when visiting hours start.

## 2015-04-03 NOTE — ED Provider Notes (Signed)
Medical screening examination/treatment/procedure(s) were performed by non-physician practitioner and as supervising physician I was immediately available for consultation/collaboration.   EKG Interpretation   Date/Time:  Tuesday April 02 2015 06:48:05 EDT Ventricular Rate:  87 PR Interval:  136 QRS Duration: 80 QT Interval:  356 QTC Calculation: 428 R Axis:   79 Text Interpretation:  Sinus rhythm Confirmed by [facility]  MD, Ellan Tess  ([id number]) on 04/02/2015 6:49:58 AM       Alexiah Koroma, MD 04/03/15 [phone]

## 2015-06-03 ENCOUNTER — Encounter (HOSPITAL_COMMUNITY): Payer: Self-pay | Admitting: Emergency Medicine

## 2015-06-03 ENCOUNTER — Emergency Department (HOSPITAL_COMMUNITY)
Admission: EM | Admit: 2015-06-03 | Discharge: 2015-06-04 | Disposition: A | Discharge status: Home / Self Care | Payer: Self-pay | Attending: Emergency Medicine | Admitting: Emergency Medicine

## 2015-06-03 DIAGNOSIS — B9689 Other specified bacterial agents as the cause of diseases classified elsewhere: Secondary | ICD-10-CM

## 2015-06-03 DIAGNOSIS — Z79899 Other long term (current) drug therapy: Secondary | ICD-10-CM | POA: Insufficient documentation

## 2015-06-03 DIAGNOSIS — Z8619 Personal history of other infectious and parasitic diseases: Secondary | ICD-10-CM | POA: Insufficient documentation

## 2015-06-03 DIAGNOSIS — Z87891 Personal history of nicotine dependence: Secondary | ICD-10-CM | POA: Insufficient documentation

## 2015-06-03 DIAGNOSIS — J45909 Unspecified asthma, uncomplicated: Secondary | ICD-10-CM | POA: Insufficient documentation

## 2015-06-03 DIAGNOSIS — Z8679 Personal history of other diseases of the circulatory system: Secondary | ICD-10-CM | POA: Insufficient documentation

## 2015-06-03 DIAGNOSIS — Z3202 Encounter for pregnancy test, result negative: Secondary | ICD-10-CM | POA: Insufficient documentation

## 2015-06-03 DIAGNOSIS — N939 Abnormal uterine and vaginal bleeding, unspecified: Secondary | ICD-10-CM

## 2015-06-03 DIAGNOSIS — N390 Urinary tract infection, site not specified: Secondary | ICD-10-CM

## 2015-06-03 DIAGNOSIS — N760 Acute vaginitis: Secondary | ICD-10-CM

## 2015-06-03 LAB — WET PREP, GENITAL
Sperm: NONE SEEN
Trich, Wet Prep: NONE SEEN
Yeast Wet Prep HPF POC: NONE SEEN

## 2015-06-03 LAB — URINALYSIS, ROUTINE W REFLEX MICROSCOPIC
BILIRUBIN URINE: NEGATIVE
GLUCOSE, UA: NEGATIVE mg/dL
Ketones, ur: NEGATIVE mg/dL
Nitrite: NEGATIVE
PH: 5.5 (ref 5.0–8.0)
PROTEIN: NEGATIVE mg/dL
Specific Gravity, Urine: 1.017 (ref 1.005–1.030)

## 2015-06-03 LAB — URINE MICROSCOPIC-ADD ON

## 2015-06-03 LAB — POC URINE PREG, ED: Preg Test, Ur: NEGATIVE

## 2015-06-03 NOTE — ED Provider Notes (Signed)
CSN: [id number]     Arrival date & time 06/03/15  2104 History  By signing my name below, I, Shari Davidson, attest that this documentation has been prepared under the direction and in the presence of [employer], PA-C. Electronically Signed: [name], Scribe. 06/03/2015. 11:17 PM.    Chief Complaint  Patient presents with  . Dysuria  . Vaginal Discharge   Patient is a 24 y.o. female presenting with vaginal discharge. The history is provided by the patient. No language interpreter was used.  Vaginal Discharge   HPI Comments: Shari Davidson is a 24 y.o. female who presents to the Emergency Department complaining of irritation with urination, spotting before and after her menstrual cycle, and malodorous smell. Pt reports similar symptoms before and states that the physician diagnosed her with bacterial vaginosis. She also reports a positive test for chlamydia about a month ago and notes that she was treated with metronidazole. Pt states that she is sexually active and monogamous but does not use protection during intercourse. She also reports a miscarriage eight months ago. She denies abdominal pain, nausea, vomiting, and fevers.    Past Medical History  Diagnosis Date  . Asthma   . UTI (lower urinary tract infection) 12/14/2010    10K E.coli on ER visit for abdominal pain  . Chlamydia 08/06/11    treated at Urgent Care  . Gonorrhea 04/20/12    treated at Urgent Care  . Migraine   . Trichomonas contact, treated    Past Surgical History  Procedure Laterality Date  . No past surgeries     Family History  Problem Relation Age of Onset  . Hypertension Other    Social History  Substance Use Topics  . Smoking status: Former Smoker -- 0.30 packs/day    Types: Cigarettes    Quit date: 08/30/2014  . Smokeless tobacco: Never Used  . Alcohol Use: No     Comment: OCCASIONAL   OB History    Gravida Para Term Preterm AB TAB SAB Ectopic Multiple Living   1 0    1  1   0     Review of Systems  Genitourinary: Positive for vaginal discharge.  All other systems reviewed and are negative.  Allergies  Review of patient's allergies indicates no known allergies.  Home Medications   Prior to Admission medications   Medication Sig Start Date End Date Taking? Authorizing Provider  albuterol (PROVENTIL HFA;VENTOLIN HFA) 108 (90 BASE) MCG/ACT inhaler Inhale 2 puffs into the lungs every 6 (six) hours as needed for wheezing or shortness of breath.    Historical Provider, MD  ibuprofen (ADVIL,MOTRIN) 800 MG tablet Take 1 tablet (800 mg total) by mouth every 8 (eight) hours as needed for headache, moderate pain or cramping. 03/22/15   Dorathy Kinsman, CNM  metroNIDAZOLE (FLAGYL) 500 MG tablet Take 1 tablet (500 mg total) by mouth 2 (two) times daily. 06/04/15   Eyvonne Mechanic, PA-C  nitrofurantoin, macrocrystal-monohydrate, (MACROBID) 100 MG capsule Take 1 capsule (100 mg total) by mouth 2 (two) times daily. 06/04/15   Codi Folkerts, PA-C   BP 102/60 mmHg  Pulse 101  Temp(Src) 98.5 F (36.9 C) (Oral)  Resp 18  Ht  (1.626 m)  Wt 53.071 kg  BMI 20.07 kg/m2  SpO2 98%  LMP 05/22/2015 (Approximate)   Physical Exam  Constitutional: She is oriented to person, place, and time. She appears well-developed and well-nourished. No distress.  HENT:  Head: Normocephalic.  Eyes: Conjunctivae are  normal.  Cardiovascular: Regular rhythm.   Pulmonary/Chest: Effort normal.  Abdominal: She exhibits no distension.  Genitourinary: Cervix exhibits no discharge. There is bleeding in the vagina. No vaginal discharge found.  Chaperone (RN) was present for exam which was performed with no discomfort or complications.    Neurological: She is alert and oriented to person, place, and time.  Skin: Skin is warm and dry.  Psychiatric: She has a normal mood and affect.  Nursing note and vitals reviewed.   ED Course  Procedures  DIAGNOSTIC STUDIES:    Oxygen Saturation is  100% on RA, normal by my interpretation.   COORDINATION OF CARE:  10:50 PM Will order labs. Will perform pelvic exam. Discussed treatment plan with pt at bedside and pt agreed to plan.   Labs Review Labs Reviewed  WET PREP, GENITAL - Abnormal; Notable for the following:    Clue Cells Wet Prep HPF POC PRESENT (*)    WBC, Wet Prep HPF POC MODERATE (*)    All other components within normal limits  URINALYSIS, ROUTINE W REFLEX MICROSCOPIC (NOT AT [facility]) - Abnormal; Notable for the following:    APPearance HAZY (*)    Hgb urine dipstick MODERATE (*)    Leukocytes, UA SMALL (*)    All other components within normal limits  URINE MICROSCOPIC-ADD ON - Abnormal; Notable for the following:    Squamous Epithelial / LPF 0-5 (*)    Bacteria, UA FEW (*)    All other components within normal limits  URINE CULTURE  RPR  HIV ANTIBODY (ROUTINE TESTING)  POC URINE PREG, ED  GC/CHLAMYDIA PROBE AMP ([place]) NOT AT [facility]    Imaging Review No results found. I have personally reviewed and evaluated these lab results as part of my medical decision-making.   MDM   Final diagnoses:  Bacterial vaginosis  UTI (lower urinary tract infection)  Vaginal bleeding    Labs: POC Urine Pregnancy; Urinalysis; Urine microscopic add-on- negative pregnancy, urinalysis concerning for urinary tract infection  Imaging:  Consults:  Therapeutics:  Discharge Meds: Metronidazole, nitrofurantoin  Assessment/plan: Patient presents with possible STDs, bacterial vaginosis, urinary tract infection. Patient recently had a chlamydia infection, we will hold off on prophylactic antibiotics for STDs until cultures return. Patient has complaints consistent with urinary tract infection. She will be started on nitrofurantoin, urine culture will be sent off to determine continuation of therapy. Patient had vaginal bleeding at the time my evaluation, she has no lower abdominal pain. Patient has reassuring vital signs, no  significant findings on exam that necessitate further evaluation or management here in the ED setting. She will be discharged home with instructions to follow-up with women's health for further evaluation. She'll be contacted with culture results in the next few days. Patient is given strict return precautions, verbalizes understanding and agreement to today's plan and had no further questions concerns at the time of discharge  I personally performed the services described in this documentation, which was scribed in my presence. The recorded information has been reviewed and is accurate.   Eyvonne MechanicJeffrey Sheilyn Boehlke, PA-C 06/04/15 0045  Azalia BilisKevin Campos, MD 06/04/15 0157

## 2015-06-03 NOTE — ED Notes (Signed)
Pt. reports dysuria , urinary frequency , oliguria and occasional vaginal spotting between menstrual period and malodorous vaginal discharge.

## 2015-06-04 LAB — HIV ANTIBODY (ROUTINE TESTING W REFLEX): HIV Screen 4th Generation wRfx: NONREACTIVE

## 2015-06-04 LAB — GC/CHLAMYDIA PROBE AMP (~~LOC~~) NOT AT ARMC
Chlamydia: NEGATIVE
Neisseria Gonorrhea: NEGATIVE

## 2015-06-04 LAB — RPR: RPR Ser Ql: NONREACTIVE

## 2015-06-04 MED ORDER — NITROFURANTOIN MONOHYD MACRO 100 MG PO CAPS: 100.0000 mg | ORAL_CAPSULE | Freq: Two times a day (BID) | ORAL | Status: DC

## 2015-06-04 MED ORDER — METRONIDAZOLE 500 MG PO TABS: 500.0000 mg | ORAL_TABLET | Freq: Two times a day (BID) | ORAL | Status: DC

## 2015-06-04 NOTE — Discharge Instructions (Signed)

## 2015-06-06 ENCOUNTER — Telehealth (HOSPITAL_BASED_OUTPATIENT_CLINIC_OR_DEPARTMENT_OTHER): Payer: Self-pay | Admitting: Emergency Medicine

## 2015-06-06 LAB — URINE CULTURE
Culture: >=100000
Special Requests: NORMAL

## 2015-06-07 NOTE — Telephone Encounter (Signed)
Post ED Visit - Positive Culture Follow-up  Culture report reviewed by antimicrobial stewardship pharmacist:  []  Shari Davidson, Pharm.D. []  Shari Davidson, [address] BCPS [x]  Shari Davidson, Pharm.D. []  Shari Davidson, Pharm.D., BCPS []  Shari Davidson, [address] BCPS, AAHIVP []  Shari Davidson, Pharm.D., BCPS, AAHIVP []  Shari Davidson, Pharm.D. []  Shari Davidson, [place]  Positive macrobid culture Treated with urine, organism sensitive to the same and no further patient follow-up is required at this time.  Shari Davidson, Shari Davidson 06/07/2015, 11:28 AM

## 2015-07-26 ENCOUNTER — Encounter (HOSPITAL_COMMUNITY): Payer: Self-pay | Admitting: Emergency Medicine

## 2015-07-26 ENCOUNTER — Emergency Department (HOSPITAL_COMMUNITY)
Admission: EM | Admit: 2015-07-26 | Discharge: 2015-07-26 | Disposition: A | Discharge status: Home / Self Care | Payer: Self-pay | Attending: Emergency Medicine | Admitting: Emergency Medicine

## 2015-07-26 DIAGNOSIS — J45909 Unspecified asthma, uncomplicated: Secondary | ICD-10-CM | POA: Insufficient documentation

## 2015-07-26 DIAGNOSIS — Z79899 Other long term (current) drug therapy: Secondary | ICD-10-CM | POA: Insufficient documentation

## 2015-07-26 DIAGNOSIS — Z87440 Personal history of urinary (tract) infections: Secondary | ICD-10-CM | POA: Insufficient documentation

## 2015-07-26 DIAGNOSIS — Z792 Long term (current) use of antibiotics: Secondary | ICD-10-CM | POA: Insufficient documentation

## 2015-07-26 DIAGNOSIS — Z8619 Personal history of other infectious and parasitic diseases: Secondary | ICD-10-CM | POA: Insufficient documentation

## 2015-07-26 DIAGNOSIS — Z8679 Personal history of other diseases of the circulatory system: Secondary | ICD-10-CM | POA: Insufficient documentation

## 2015-07-26 DIAGNOSIS — J069 Acute upper respiratory infection, unspecified: Secondary | ICD-10-CM | POA: Insufficient documentation

## 2015-07-26 DIAGNOSIS — R110 Nausea: Secondary | ICD-10-CM | POA: Insufficient documentation

## 2015-07-26 DIAGNOSIS — Z87891 Personal history of nicotine dependence: Secondary | ICD-10-CM | POA: Insufficient documentation

## 2015-07-26 DIAGNOSIS — J029 Acute pharyngitis, unspecified: Secondary | ICD-10-CM

## 2015-07-26 LAB — RAPID STREP SCREEN (MED CTR MEBANE ONLY): Streptococcus, Group A Screen (Direct): NEGATIVE

## 2015-07-26 NOTE — ED Provider Notes (Addendum)
CSN: [id number]     Arrival date & time 07/26/15  2100 History  By signing my name below, I, Budd Palmer, attest that this documentation has been prepared under the direction and in the presence of Arman Filter, NP. Electronically Signed: Budd Palmer, ED Scribe. 07/26/2015. 10:15 PM.    Chief Complaint  Patient presents with  . Sore Throat   The history is provided by the patient. No language interpreter was used.   HPI Comments: Shari Davidson is a 25 y.o. female former smoker who presents to the Emergency Department complaining of sore throat onset 2 days ago. She reports associated nausea, rhinorrhea, and cough. She has been gargling with salt and water without relief. She notes her LNMPwas on 12/20. She has taken 2 home pregnancy tests, most recently 3 days ago, which both came back negative. She reports that her last pregnancy was a miscarriage, and that she has been trying to get pregnant again. Pt denies vomiting and fever, but states she has "felt a little warm" recently.   Past Medical History  Diagnosis Date  . Asthma   . UTI (lower urinary tract infection) 12/14/2010    10K E.coli on ER visit for abdominal pain  . Chlamydia 08/06/11    treated at Urgent Care  . Gonorrhea 04/20/12    treated at Urgent Care  . Migraine   . Trichomonas contact, treated    Past Surgical History  Procedure Laterality Date  . No past surgeries     Family History  Problem Relation Age of Onset  . Hypertension Other    Social History  Substance Use Topics  . Smoking status: Former Smoker -- 0.30 packs/day    Types: Cigarettes    Quit date: 08/30/2014  . Smokeless tobacco: Never Used  . Alcohol Use: Yes     Comment: OCCASIONAL   OB History    Gravida Para Term Preterm AB TAB SAB Ectopic Multiple Living   1 0   1  1   0     Review of Systems  Constitutional: Negative for fever.  HENT: Positive for rhinorrhea and sore throat.   Respiratory: Positive for cough.    Gastrointestinal: Positive for nausea. Negative for vomiting.    Allergies  Review of patient's allergies indicates no known allergies.  Home Medications   Prior to Admission medications   Medication Sig Start Date End Date Taking? Authorizing Provider  albuterol (PROVENTIL HFA;VENTOLIN HFA) 108 (90 BASE) MCG/ACT inhaler Inhale 2 puffs into the lungs every 6 (six) hours as needed for wheezing or shortness of breath.    Historical Provider, MD  ibuprofen (ADVIL,MOTRIN) 800 MG tablet Take 1 tablet (800 mg total) by mouth every 8 (eight) hours as needed for headache, moderate pain or cramping. 03/22/15   Dorathy Kinsman, CNM  metroNIDAZOLE (FLAGYL) 500 MG tablet Take 1 tablet (500 mg total) by mouth 2 (two) times daily. 06/04/15   Eyvonne Mechanic, PA-C  nitrofurantoin, macrocrystal-monohydrate, (MACROBID) 100 MG capsule Take 1 capsule (100 mg total) by mouth 2 (two) times daily. 06/04/15   Eyvonne Mechanic, PA-C   BP 129/76 mmHg  Pulse 73  Temp(Src) 98.6 F (37 C) (Oral)  Resp 16  Ht  (1.626 m)  Wt 53.071 kg  BMI 20.07 kg/m2  SpO2 96%  LMP 06/18/2015 (Exact Date) Physical Exam  Constitutional: She appears well-developed and well-nourished.  HENT:  Head: Normocephalic.  Right Ear: External ear normal.  Left Ear: External ear normal.  Mouth/Throat: Oropharynx  is clear and moist.  Eyes: Pupils are equal, round, and reactive to light.  Neck: Normal range of motion.  Cardiovascular: Normal rate.   Pulmonary/Chest: Effort normal.  Musculoskeletal: Normal range of motion.  Neurological: She is alert.  Skin: Skin is warm and dry.  Nursing note and vitals reviewed.   ED Course  Procedures  DIAGNOSTIC STUDIES: Oxygen Saturation is 96% on RA, adequate by my interpretation.    COORDINATION OF CARE: 10:08 PM - Discussed plans to wait on rapid strep results and order a pregnancy test. Pt advised of plan for treatment and pt agrees.  Labs Review Labs Reviewed  RAPID STREP SCREEN  (NOT AT [facility])  CULTURE, GROUP A STREP ([facility])  POC URINE PREG, ED    Imaging Review No results found. I have personally reviewed and evaluated these images and lab results as part of my medical decision-making.   EKG Interpretation None     Strep test negative MDM   Final diagnoses:  URI (upper respiratory infection)  Viral pharyngitis    I personally performed the services described in this documentation, which was scribed in my presence. The recorded information has been reviewed and is accurate.  Earley Favor, NP 07/26/15 2314  Earley Favor, NP 07/26/15 1610  Raeford Razor, MD 08/29/15 0759  Earley Favor, NP 09/14/15 2137  Raeford Razor, MD 09/30/15 [phone]

## 2015-07-26 NOTE — ED Notes (Signed)
Pt states she has a sore throat and nausea  Pt states sxs started 2 days ago  Pt states she has been gargling with warm salt water and drinking hot tea

## 2015-07-26 NOTE — Discharge Instructions (Signed)
Pharyngitis Pharyngitis is a sore throat (pharynx). There is redness, pain, and swelling of your throat. HOME CARE   Drink enough fluids to keep your pee (urine) clear or pale yellow.  Only take medicine as told by your doctor.  You may get sick again if you do not take medicine as told. Finish your medicines, even if you start to feel better.  Do not take aspirin.  Rest.  Rinse your mouth (gargle) with salt water ( tsp of salt per 1 qt of water) every 1-2 hours. This will help the pain.  If you are not at risk for choking, you can suck on hard candy or sore throat lozenges. GET HELP IF:  You have large, tender lumps on your neck.  You have a rash.  You cough up green, yellow-brown, or bloody spit. GET HELP RIGHT AWAY IF:   You have a stiff neck.  You drool or cannot swallow liquids.  You throw up (vomit) or are not able to keep medicine or liquids down.  You have very bad pain that does not go away with medicine.  You have problems breathing (not from a stuffy nose). MAKE SURE YOU:   Understand these instructions.  Will watch your condition.  Will get help right away if you are not doing well or get worse.   This information is not intended to replace advice given to you by your health care provider. Make sure you discuss any questions you have with your health care provider.   Document Released: 12/02/2007 Document Revised: 04/05/2013 Document Reviewed: 02/20/2013 Elsevier Interactive Patient Education 2016 [employer]. Your strep test is negative You cab safely take over the counter medications for nasal congestion and pain such as Sudafed, tylenol sinus Motrin

## 2015-07-29 LAB — CULTURE, GROUP A STREP (THRC)

## 2015-08-04 ENCOUNTER — Encounter (HOSPITAL_COMMUNITY): Payer: Self-pay | Admitting: *Deleted

## 2015-08-04 ENCOUNTER — Inpatient Hospital Stay (HOSPITAL_COMMUNITY)
Admission: AD | Admit: 2015-08-04 | Discharge: 2015-08-04 | Disposition: A | Discharge status: Home / Self Care | Payer: Self-pay | Source: Ambulatory Visit | Attending: Obstetrics & Gynecology | Admitting: Obstetrics & Gynecology

## 2015-08-04 DIAGNOSIS — Z87891 Personal history of nicotine dependence: Secondary | ICD-10-CM | POA: Insufficient documentation

## 2015-08-04 DIAGNOSIS — B9689 Other specified bacterial agents as the cause of diseases classified elsewhere: Secondary | ICD-10-CM

## 2015-08-04 DIAGNOSIS — J45909 Unspecified asthma, uncomplicated: Secondary | ICD-10-CM | POA: Insufficient documentation

## 2015-08-04 DIAGNOSIS — A499 Bacterial infection, unspecified: Secondary | ICD-10-CM

## 2015-08-04 DIAGNOSIS — N760 Acute vaginitis: Secondary | ICD-10-CM | POA: Insufficient documentation

## 2015-08-04 LAB — URINALYSIS, ROUTINE W REFLEX MICROSCOPIC
Bilirubin Urine: NEGATIVE
GLUCOSE, UA: NEGATIVE mg/dL
Ketones, ur: 15 mg/dL — AB
LEUKOCYTES UA: NEGATIVE
Nitrite: NEGATIVE
PROTEIN: NEGATIVE mg/dL
SPECIFIC GRAVITY, URINE: 1.025 (ref 1.005–1.030)
pH: 5.5 (ref 5.0–8.0)

## 2015-08-04 LAB — URINE MICROSCOPIC-ADD ON

## 2015-08-04 LAB — WET PREP, GENITAL
SPERM: NONE SEEN
Trich, Wet Prep: NONE SEEN
YEAST WET PREP: NONE SEEN

## 2015-08-04 LAB — POCT PREGNANCY, URINE: Preg Test, Ur: NEGATIVE

## 2015-08-04 MED ORDER — METRONIDAZOLE 500 MG PO TABS: 500.0000 mg | ORAL_TABLET | Freq: Two times a day (BID) | ORAL | Status: DC

## 2015-08-04 NOTE — MAU Note (Signed)
Pt presents complaining of heavy vaginal discharge with a smell and lower abdominal pain. Reports lack of appetite and nausea. LMP 1/31

## 2015-08-04 NOTE — MAU Provider Note (Signed)
History     CSN: [id number]  Arrival date and time: 08/04/15 2014   First Provider Initiated Contact with Patient 08/04/15 2050      Chief Complaint  Patient presents with  . Vaginal Discharge  . Abdominal Pain   Vaginal Discharge The patient's primary symptoms include genital itching, a genital odor and vaginal discharge. This is a new problem. The current episode started 1 to 4 weeks ago. The problem occurs intermittently. The problem has been unchanged. Pain severity now: 9/10  The problem affects the left side. She is not pregnant. Associated symptoms include abdominal pain and nausea. Pertinent negatives include no chills, constipation, diarrhea, dysuria, fever, frequency, urgency or vomiting. The vaginal discharge was malodorous (tan ). There has been no bleeding. It is unknown whether or not her partner has an STD. She uses nothing for contraception. Her menstrual history has been regular (LMP 07/30/15).     Past Medical History  Diagnosis Date  . Asthma   . UTI (lower urinary tract infection) 12/14/2010    10K E.coli on ER visit for abdominal pain  . Chlamydia 08/06/11    treated at Urgent Care  . Gonorrhea 04/20/12    treated at Urgent Care  . Migraine   . Trichomonas contact, treated     Past Surgical History  Procedure Laterality Date  . No past surgeries      Family History  Problem Relation Age of Onset  . Hypertension Other     Social History  Substance Use Topics  . Smoking status: Former Smoker -- 0.30 packs/day    Types: Cigarettes    Quit date: 08/30/2014  . Smokeless tobacco: Never Used  . Alcohol Use: Yes     Comment: OCCASIONAL    Allergies: No Known Allergies  Prescriptions prior to admission  Medication Sig Dispense Refill Last Dose  . albuterol (PROVENTIL HFA;VENTOLIN HFA) 108 (90 BASE) MCG/ACT inhaler Inhale 2 puffs into the lungs every 6 (six) hours as needed for wheezing or shortness of breath.   rescue  . ibuprofen (ADVIL,MOTRIN) 800 MG  tablet Take 1 tablet (800 mg total) by mouth every 8 (eight) hours as needed for headache, moderate pain or cramping. (Patient not taking: Reported on 08/04/2015) 3 tablet 1 Completed Course at Unknown time  . metroNIDAZOLE (FLAGYL) 500 MG tablet Take 1 tablet (500 mg total) by mouth 2 (two) times daily. (Patient not taking: Reported on 08/04/2015) 14 tablet 0 Completed Course at Unknown time  . nitrofurantoin, macrocrystal-monohydrate, (MACROBID) 100 MG capsule Take 1 capsule (100 mg total) by mouth 2 (two) times daily. (Patient not taking: Reported on 08/04/2015) 10 capsule 0 Completed Course at Unknown time    Review of Systems  Constitutional: Negative for fever and chills.  Gastrointestinal: Positive for nausea and abdominal pain. Negative for vomiting, diarrhea and constipation.  Genitourinary: Positive for vaginal discharge. Negative for dysuria, urgency and frequency.   Physical Exam   Blood pressure 158/81, pulse 109, temperature 98 F (36.7 C), temperature source Oral, resp. rate 18, last menstrual period 07/30/2015, unknown if currently breastfeeding.  Physical Exam  Nursing note and vitals reviewed. Constitutional: She is oriented to person, place, and time. She appears well-developed and well-nourished. No distress.  HENT:  Head: Normocephalic.  Cardiovascular: Normal rate.   Respiratory: Effort normal.  GI: Soft. There is no tenderness.  Neurological: She is alert and oriented to person, place, and time.  Skin: Skin is warm and dry.  Psychiatric: She has a normal mood and  affect.   Results for orders placed or performed during the hospital encounter of 08/04/15 (from the past 24 hour(s))  Urinalysis, Routine w reflex microscopic (not at [facility])     Status: Abnormal   Collection Time: 08/04/15  8:21 PM  Result Value Ref Range   Color, Urine YELLOW YELLOW   APPearance CLEAR CLEAR   Specific Gravity, Urine 1.025 1.005 - 1.030   pH 5.5 5.0 - 8.0   Glucose, UA NEGATIVE NEGATIVE  mg/dL   Hgb urine dipstick TRACE (A) NEGATIVE   Bilirubin Urine NEGATIVE NEGATIVE   Ketones, ur 15 (A) NEGATIVE mg/dL   Protein, ur NEGATIVE NEGATIVE mg/dL   Nitrite NEGATIVE NEGATIVE   Leukocytes, UA NEGATIVE NEGATIVE  Urine microscopic-add on     Status: Abnormal   Collection Time: 08/04/15  8:21 PM  Result Value Ref Range   Squamous Epithelial / LPF 0-5 (A) NONE SEEN   WBC, UA 0-5 0 - 5 WBC/hpf   RBC / HPF 0-5 0 - 5 RBC/hpf   Bacteria, UA FEW (A) NONE SEEN  Pregnancy, urine POC     Status: None   Collection Time: 08/04/15  8:28 PM  Result Value Ref Range   Preg Test, Ur NEGATIVE NEGATIVE  Wet prep, genital     Status: Abnormal   Collection Time: 08/04/15  8:40 PM  Result Value Ref Range   Yeast Wet Prep HPF POC NONE SEEN NONE SEEN   Trich, Wet Prep NONE SEEN NONE SEEN   Clue Cells Wet Prep HPF POC PRESENT (A) NONE SEEN   WBC, Wet Prep HPF POC MODERATE (A) NONE SEEN   Sperm NONE SEEN      MAU Course  Procedures  MDM   Assessment and Plan   1. Bacterial vaginosis    DC home Comfort measures reviewed  RX: flagyl  BID x 7 days   Follow-up Information    Schedule an appointment as soon as possible for a visit with [facility].   Contact information:   [address] [place] [address] [phone]         Tawnya Crook 08/04/2015, 8:52 PM

## 2015-08-04 NOTE — Discharge Instructions (Signed)

## 2015-08-05 LAB — GC/CHLAMYDIA PROBE AMP (~~LOC~~) NOT AT ARMC
CHLAMYDIA, DNA PROBE: NEGATIVE
Neisseria Gonorrhea: NEGATIVE

## 2015-08-25 ENCOUNTER — Inpatient Hospital Stay (HOSPITAL_COMMUNITY)
Admission: AD | Admit: 2015-08-25 | Discharge: 2015-08-25 | Disposition: A | Discharge status: Home / Self Care | Payer: Self-pay | Source: Ambulatory Visit | Attending: Obstetrics & Gynecology | Admitting: Obstetrics & Gynecology

## 2015-08-25 ENCOUNTER — Encounter (HOSPITAL_COMMUNITY): Payer: Self-pay | Admitting: *Deleted

## 2015-08-25 DIAGNOSIS — Z3202 Encounter for pregnancy test, result negative: Secondary | ICD-10-CM | POA: Insufficient documentation

## 2015-08-25 DIAGNOSIS — N9089 Other specified noninflammatory disorders of vulva and perineum: Secondary | ICD-10-CM

## 2015-08-25 DIAGNOSIS — N760 Acute vaginitis: Secondary | ICD-10-CM | POA: Insufficient documentation

## 2015-08-25 DIAGNOSIS — Z87891 Personal history of nicotine dependence: Secondary | ICD-10-CM | POA: Insufficient documentation

## 2015-08-25 LAB — CBC
HEMATOCRIT: 36.8 % (ref 36.0–46.0)
Hemoglobin: 12.9 g/dL (ref 12.0–15.0)
MCH: 32.1 pg (ref 26.0–34.0)
MCHC: 35.1 g/dL (ref 30.0–36.0)
MCV: 91.5 fL (ref 78.0–100.0)
PLATELETS: 238 10*3/uL (ref 150–400)
RBC: 4.02 MIL/uL (ref 3.87–5.11)
RDW: 11.9 % (ref 11.5–15.5)
WBC: 4.7 10*3/uL (ref 4.0–10.5)

## 2015-08-25 LAB — RPR: RPR Ser Ql: NONREACTIVE

## 2015-08-25 LAB — WET PREP, GENITAL
Clue Cells Wet Prep HPF POC: NONE SEEN
Trich, Wet Prep: NONE SEEN
WBC WET PREP: NONE SEEN
Yeast Wet Prep HPF POC: NONE SEEN

## 2015-08-25 LAB — HIV ANTIBODY (ROUTINE TESTING W REFLEX): HIV Screen 4th Generation wRfx: NONREACTIVE

## 2015-08-25 LAB — URINALYSIS, ROUTINE W REFLEX MICROSCOPIC
BILIRUBIN URINE: NEGATIVE
Glucose, UA: NEGATIVE mg/dL
KETONES UR: NEGATIVE mg/dL
LEUKOCYTES UA: NEGATIVE
NITRITE: NEGATIVE
PH: 5.5 (ref 5.0–8.0)
PROTEIN: NEGATIVE mg/dL
Specific Gravity, Urine: 1.02 (ref 1.005–1.030)

## 2015-08-25 LAB — URINE MICROSCOPIC-ADD ON: WBC UA: NONE SEEN WBC/hpf (ref 0–5)

## 2015-08-25 LAB — POCT PREGNANCY, URINE: PREG TEST UR: NEGATIVE

## 2015-08-25 MED ORDER — DIPHENHYDRAMINE HCL 25 MG PO CAPS
25.0000 mg | ORAL_CAPSULE | Freq: Once | ORAL | Status: AC
  Administered 2015-08-25: 25 mg via ORAL
  Filled 2015-08-25: qty 1

## 2015-08-25 NOTE — MAU Provider Note (Signed)
History     CSN: [id number]  Arrival date and time: 08/25/15 0444   First Provider Initiated Contact with Patient 08/25/15 [phone]        Chief Complaint  Patient presents with  . Vaginitis  . Vaginal Pain   HPI Comments: Shari Davidson is a 25 y.o. Female who presents for vulvar swelling & pain. Symptoms started 1 hour PTA. Last had intercourse just prior to falling asleep. Does not use condoms. States this is a recurring problem that occurs after intercourse. Has been with same partner x 2 years.   Female GU Problem The patient's primary symptoms include a genital odor and vaginal discharge. The patient's pertinent negatives include no genital itching, genital lesions, pelvic pain or vaginal bleeding. Primary symptoms comment: vulvar pain & swelling. This is a recurrent problem. The current episode started today. The problem occurs intermittently. She is not pregnant. Associated symptoms include abdominal pain (mild abdominal cramping) and painful intercourse (x 1 week). Pertinent negatives include no chills, discolored urine, dysuria, fever, frequency, hematuria or nausea. The vaginal discharge was white, malodorous and thick. There has been no bleeding. The symptoms are aggravated by intercourse. She has tried nothing for the symptoms. She is sexually active. No, her partner does not have an STD. She uses nothing for contraception. Her menstrual history has been regular. Her past medical history is significant for an STD and vaginosis. There is no history of PID.     OB History    Gravida Para Term Preterm AB TAB SAB Ectopic Multiple Living   1 0   1  1   0      Past Medical History  Diagnosis Date  . Asthma   . Chlamydia 08/06/11    treated at [facility]  . Gonorrhea 04/20/12    treated at Urgent Care  . Migraine   . Trichomonas contact, treated     Past Surgical History  Procedure Laterality Date  . No past surgeries      Family History  Problem Relation Age  of Onset  . Hypertension Other     Social History  Substance Use Topics  . Smoking status: Former Smoker -- 0.30 packs/day    Types: Cigarettes    Quit date: 08/30/2014  . Smokeless tobacco: Never Used  . Alcohol Use: Yes     Comment: OCCASIONAL    Allergies: No Known Allergies  Prescriptions prior to admission  Medication Sig Dispense Refill Last Dose  . albuterol (PROVENTIL HFA;VENTOLIN HFA) 108 (90 BASE) MCG/ACT inhaler Inhale 2 puffs into the lungs every 6 (six) hours as needed for wheezing or shortness of breath.   rescue  . ibuprofen (ADVIL,MOTRIN) 800 MG tablet Take 1 tablet (800 mg total) by mouth every 8 (eight) hours as needed for headache, moderate pain or cramping. (Patient not taking: Reported on 08/04/2015) 3 tablet 1 Completed Course at Unknown time  . [DISCONTINUED] metroNIDAZOLE (FLAGYL) 500 MG tablet Take 1 tablet (500 mg total) by mouth 2 (two) times daily. 14 tablet 0     Review of Systems  Constitutional: Negative.  Negative for fever and chills.  Gastrointestinal: Positive for abdominal pain (mild abdominal cramping). Negative for nausea.  Genitourinary: Positive for vaginal discharge. Negative for dysuria, frequency, hematuria and pelvic pain.       + vaginal discharge + vaginal swelling + dyspareunia No post coital bleeding, no vaginal bleeding, no vaginal itching   Physical Exam   Blood pressure 111/64, pulse 101, temperature 98.3 F (  36.8 C), resp. rate 18, height  (1.626 m), weight 115 lb (52.164 kg), last menstrual period 07/30/2015, unknown if currently breastfeeding.  Physical Exam  Nursing note and vitals reviewed. Constitutional: She is oriented to person, place, and time. She appears well-developed and well-nourished. No distress.  HENT:  Head: Normocephalic and atraumatic.  Eyes: Conjunctivae are normal. Right eye exhibits no discharge. Left eye exhibits no discharge. No scleral icterus.  Neck: Normal range of motion.  Respiratory:  Effort normal. No respiratory distress.  GI: Soft. She exhibits no distension. There is no tenderness.  Genitourinary: There is tenderness on the right labia. There is tenderness on the left labia. Cervix exhibits motion tenderness. Cervix exhibits no friability. Right adnexum displays no tenderness and no fullness. Left adnexum displays no tenderness and no fullness. There is erythema in the vagina. No bleeding in the vagina. Vaginal discharge (small amount of clear, watery discharge that's frothy) found.  Neurological: She is alert and oriented to person, place, and time.  Skin: Skin is warm and dry. She is not diaphoretic.  Psychiatric: She has a normal mood and affect. Her behavior is normal. Judgment and thought content normal.    MAU Course  Procedures Results for orders placed or performed during the hospital encounter of 08/25/15 (from the past 24 hour(s))  Urinalysis, Routine w reflex microscopic (not at [facility])     Status: Abnormal   Collection Time: 08/25/15  5:05 AM  Result Value Ref Range   Color, Urine YELLOW YELLOW   APPearance CLEAR CLEAR   Specific Gravity, Urine 1.020 1.005 - 1.030   pH 5.5 5.0 - 8.0   Glucose, UA NEGATIVE NEGATIVE mg/dL   Hgb urine dipstick TRACE (A) NEGATIVE   Bilirubin Urine NEGATIVE NEGATIVE   Ketones, ur NEGATIVE NEGATIVE mg/dL   Protein, ur NEGATIVE NEGATIVE mg/dL   Nitrite NEGATIVE NEGATIVE   Leukocytes, UA NEGATIVE NEGATIVE  Urine microscopic-add on     Status: Abnormal   Collection Time: 08/25/15  5:05 AM  Result Value Ref Range   Squamous Epithelial / LPF 0-5 (A) NONE SEEN   WBC, UA NONE SEEN 0 - 5 WBC/hpf   RBC / HPF 0-5 0 - 5 RBC/hpf   Bacteria, UA RARE (A) NONE SEEN  Pregnancy, urine POC     Status: None   Collection Time: 08/25/15  5:12 AM  Result Value Ref Range   Preg Test, Ur NEGATIVE NEGATIVE  Wet prep, genital     Status: None   Collection Time: 08/25/15  5:50 AM  Result Value Ref Range   Yeast Wet Prep HPF POC NONE SEEN NONE  SEEN   Trich, Wet Prep NONE SEEN NONE SEEN   Clue Cells Wet Prep HPF POC NONE SEEN NONE SEEN   WBC, Wet Prep HPF POC NONE SEEN NONE SEEN   Sperm PRESENT   CBC     Status: None   Collection Time: 08/25/15  6:03 AM  Result Value Ref Range   WBC 4.7 4.0 - 10.5 K/uL   RBC 4.02 3.87 - 5.11 MIL/uL   Hemoglobin 12.9 12.0 - 15.0 g/dL   HCT 16.1 09.6 - 04.5 %   MCV 91.5 78.0 - 100.0 fL   MCH 32.1 26.0 - 34.0 pg   MCHC 35.1 30.0 - 36.0 g/dL   RDW 40.9 81.1 - 91.4 %   Platelets 238 150 - 400 K/uL    MDM UPT negative Ice pack applied to affected area Benadryl 25 mg po Discussed possible  allergic vaginitis - especially since symptoms occur s/p intercourse.  Assessment and Plan  A:  1. Swelling of vulva   2. Allergic vaginitis     P: Discharge home Use condoms GC/CT pending F/u with PCP  Judeth Horn 08/25/2015, 5:37 AM

## 2015-08-25 NOTE — Discharge Instructions (Signed)

## 2015-08-25 NOTE — MAU Note (Signed)
Vaginal swelling and throbbing. I had this before and was told had bacterial infection. Took medicine but did not help. The pain woke me up about ago. HAve had a lot of BV over the last year. Denies vag bleeding. Small amt white vag d/c. No itching

## 2015-08-26 LAB — GC/CHLAMYDIA PROBE AMP (~~LOC~~) NOT AT ARMC
CHLAMYDIA, DNA PROBE: NEGATIVE
NEISSERIA GONORRHEA: NEGATIVE

## 2015-08-31 ENCOUNTER — Inpatient Hospital Stay (HOSPITAL_COMMUNITY)
Admission: AD | Admit: 2015-08-31 | Discharge: 2015-08-31 | Disposition: A | Discharge status: Home / Self Care | Payer: Self-pay | Source: Ambulatory Visit | Attending: Obstetrics and Gynecology | Admitting: Obstetrics and Gynecology

## 2015-08-31 ENCOUNTER — Encounter (HOSPITAL_COMMUNITY): Payer: Self-pay | Admitting: *Deleted

## 2015-08-31 DIAGNOSIS — R109 Unspecified abdominal pain: Secondary | ICD-10-CM | POA: Insufficient documentation

## 2015-08-31 DIAGNOSIS — Z87891 Personal history of nicotine dependence: Secondary | ICD-10-CM | POA: Insufficient documentation

## 2015-08-31 DIAGNOSIS — G43909 Migraine, unspecified, not intractable, without status migrainosus: Secondary | ICD-10-CM | POA: Insufficient documentation

## 2015-08-31 DIAGNOSIS — R300 Dysuria: Secondary | ICD-10-CM | POA: Insufficient documentation

## 2015-08-31 DIAGNOSIS — J45909 Unspecified asthma, uncomplicated: Secondary | ICD-10-CM | POA: Insufficient documentation

## 2015-08-31 LAB — URINALYSIS, ROUTINE W REFLEX MICROSCOPIC
Bilirubin Urine: NEGATIVE
Glucose, UA: NEGATIVE mg/dL
Ketones, ur: NEGATIVE mg/dL
Nitrite: NEGATIVE
Protein, ur: NEGATIVE mg/dL
Specific Gravity, Urine: 1.025 (ref 1.005–1.030)
pH: 5.5 (ref 5.0–8.0)

## 2015-08-31 LAB — URINE MICROSCOPIC-ADD ON

## 2015-08-31 LAB — POCT PREGNANCY, URINE: Preg Test, Ur: NEGATIVE

## 2015-08-31 MED ORDER — PHENAZOPYRIDINE HCL 200 MG PO TABS: 200.0000 mg | ORAL_TABLET | Freq: Three times a day (TID) | ORAL | Status: DC | PRN

## 2015-08-31 MED ORDER — NITROFURANTOIN MONOHYD MACRO 100 MG PO CAPS: 100.0000 mg | ORAL_CAPSULE | Freq: Two times a day (BID) | ORAL | Status: DC

## 2015-08-31 NOTE — Discharge Instructions (Signed)

## 2015-08-31 NOTE — MAU Provider Note (Signed)
  History     CSN: [id number]  Arrival date and time: 08/31/15 0212   None     Chief Complaint  Patient presents with  . Back Pain  . Dysuria  . Abdominal Pain   HPI  Shari Davidson 24 y.o. G1P0010 presents to the ER after having burning with urination and some bilateral flank pain on Thursday. She started her period yesterday.  Past Medical History  Diagnosis Date  . Asthma   . Chlamydia 08/06/11    treated at Urgent Care  . Gonorrhea 04/20/12    treated at [facility]gent Care  . Migraine   . Trichomonas contact, treated     Past Surgical History  Procedure Laterality Date  . No past surgeries      Family History  Problem Relation Age of Onset  . Hypertension Other     Social History  Substance Use Topics  . Smoking status: Former Smoker -- 0.30 packs/day    Types: Cigarettes    Quit date: 08/30/2014  . Smokeless tobacco: Never Used  . Alcohol Use: Yes     Comment: OCCASIONAL    Allergies: No Known Allergies  Prescriptions prior to admission  Medication Sig Dispense Refill Last Dose  . albuterol (PROVENTIL HFA;VENTOLIN HFA) 108 (90 BASE) MCG/ACT inhaler Inhale 2 puffs into the lungs every 6 (six) hours as needed for wheezing or shortness of breath.   rescue  . ibuprofen (ADVIL,MOTRIN) 800 MG tablet Take 1 tablet (800 mg total) by mouth every 8 (eight) hours as needed for headache, moderate pain or cramping. (Patient not taking: Reported on 08/04/2015) 3 tablet 1 Completed Course at Unknown time    Review of Systems  Gastrointestinal: Positive for abdominal pain.  Genitourinary: Positive for dysuria, hematuria and flank pain.   Physical Exam   Blood pressure 115/62, pulse 70, temperature 98.3 F (36.8 C), temperature source Oral, resp. rate 16, height 5\' 4"  (1.626 m), weight 117 lb (53.071 kg), last menstrual period 08/30/2015, SpO2 100 %, unknown if currently breastfeeding.  Physical Exam  Nursing note and vitals reviewed. Constitutional: She is  oriented to person, place, and time. She appears well-developed and well-nourished. No distress.  HENT:  Head: Normocephalic and atraumatic.  Neck: Normal range of motion.  Cardiovascular: Normal rate and regular rhythm.   Respiratory: Effort normal and breath sounds normal. No respiratory distress.  GI: Soft. There is no tenderness.  Musculoskeletal: Normal range of motion.  Neurological: She is alert and oriented to person, place, and time.  Skin: Skin is warm and dry.  Psychiatric: She has a normal mood and affect. Her behavior is normal. Judgment and thought content normal.    MAU Course  Procedures  MDM Sending urine off for culture will treat with macrobid empricially  Assessment and Plan  Dysuria Urine Cx macrobid 100 BID Pyridium 200mg   Discharge  [name] 08/31/2015, 4:17 AM

## 2015-08-31 NOTE — Progress Notes (Signed)
Shari BolusLori Clemmons CNM notified of u/a results. Will see pt

## 2015-08-31 NOTE — Progress Notes (Signed)
[name] CNM notified of pt's admission and status. To notify provider when u/a results

## 2015-08-31 NOTE — Progress Notes (Signed)
Written and verbal d/c instructions given and understanding voiced. 

## 2015-08-31 NOTE — MAU Note (Signed)
STarted having back pain and dysuria Thurs. Burns to void and only small amt comes out. Some abd cramping

## 2015-10-12 ENCOUNTER — Emergency Department (HOSPITAL_COMMUNITY)
Admission: EM | Admit: 2015-10-12 | Discharge: 2015-10-12 | Disposition: A | Discharge status: Home / Self Care | Payer: Self-pay | Attending: Emergency Medicine | Admitting: Emergency Medicine

## 2015-10-12 ENCOUNTER — Encounter (HOSPITAL_COMMUNITY): Payer: Self-pay | Admitting: *Deleted

## 2015-10-12 ENCOUNTER — Emergency Department (HOSPITAL_COMMUNITY): Payer: Self-pay

## 2015-10-12 DIAGNOSIS — R05 Cough: Secondary | ICD-10-CM

## 2015-10-12 DIAGNOSIS — R0602 Shortness of breath: Secondary | ICD-10-CM

## 2015-10-12 DIAGNOSIS — R300 Dysuria: Secondary | ICD-10-CM | POA: Insufficient documentation

## 2015-10-12 DIAGNOSIS — Z79899 Other long term (current) drug therapy: Secondary | ICD-10-CM | POA: Insufficient documentation

## 2015-10-12 DIAGNOSIS — Z8679 Personal history of other diseases of the circulatory system: Secondary | ICD-10-CM | POA: Insufficient documentation

## 2015-10-12 DIAGNOSIS — R1030 Lower abdominal pain, unspecified: Secondary | ICD-10-CM | POA: Insufficient documentation

## 2015-10-12 DIAGNOSIS — R059 Cough, unspecified: Secondary | ICD-10-CM

## 2015-10-12 DIAGNOSIS — R109 Unspecified abdominal pain: Secondary | ICD-10-CM | POA: Insufficient documentation

## 2015-10-12 DIAGNOSIS — Z87891 Personal history of nicotine dependence: Secondary | ICD-10-CM | POA: Insufficient documentation

## 2015-10-12 DIAGNOSIS — J45901 Unspecified asthma with (acute) exacerbation: Secondary | ICD-10-CM

## 2015-10-12 DIAGNOSIS — Z3202 Encounter for pregnancy test, result negative: Secondary | ICD-10-CM | POA: Insufficient documentation

## 2015-10-12 DIAGNOSIS — Z792 Long term (current) use of antibiotics: Secondary | ICD-10-CM | POA: Insufficient documentation

## 2015-10-12 DIAGNOSIS — R0789 Other chest pain: Secondary | ICD-10-CM | POA: Insufficient documentation

## 2015-10-12 DIAGNOSIS — Z8619 Personal history of other infectious and parasitic diseases: Secondary | ICD-10-CM | POA: Insufficient documentation

## 2015-10-12 DIAGNOSIS — R42 Dizziness and giddiness: Secondary | ICD-10-CM | POA: Insufficient documentation

## 2015-10-12 LAB — URINALYSIS, ROUTINE W REFLEX MICROSCOPIC
Bilirubin Urine: NEGATIVE
Glucose, UA: NEGATIVE mg/dL
Hgb urine dipstick: NEGATIVE
Ketones, ur: NEGATIVE mg/dL
Leukocytes, UA: NEGATIVE
Nitrite: NEGATIVE
Protein, ur: NEGATIVE mg/dL
Specific Gravity, Urine: 1.02 (ref 1.005–1.030)
pH: 6.5 (ref 5.0–8.0)

## 2015-10-12 LAB — POC URINE PREG, ED: Preg Test, Ur: NEGATIVE

## 2015-10-12 MED ORDER — BENZONATATE 100 MG PO CAPS: 100.0000 mg | ORAL_CAPSULE | Freq: Three times a day (TID) | ORAL | Status: DC | PRN

## 2015-10-12 MED ORDER — PREDNISONE 20 MG PO TABS
60.0000 mg | ORAL_TABLET | Freq: Once | ORAL | Status: AC
  Administered 2015-10-12: 60 mg via ORAL
  Filled 2015-10-12: qty 3

## 2015-10-12 MED ORDER — CETIRIZINE-PSEUDOEPHEDRINE ER 5-120 MG PO TB12: 1.0000 | ORAL_TABLET | Freq: Every day | ORAL | Status: DC

## 2015-10-12 MED ORDER — PREDNISONE 20 MG PO TABS: 60.0000 mg | ORAL_TABLET | Freq: Once | ORAL | Status: DC

## 2015-10-12 MED ORDER — ALBUTEROL (5 MG/ML) CONTINUOUS INHALATION SOLN
10.0000 mg/h | INHALATION_SOLUTION | Freq: Once | RESPIRATORY_TRACT | Status: AC
  Administered 2015-10-12: 10 mg/h via RESPIRATORY_TRACT
  Filled 2015-10-12: qty 20

## 2015-10-12 MED ORDER — ALBUTEROL SULFATE HFA 108 (90 BASE) MCG/ACT IN AERS: 2.0000 | INHALATION_SPRAY | Freq: Four times a day (QID) | RESPIRATORY_TRACT | Status: DC | PRN

## 2015-10-12 NOTE — ED Provider Notes (Signed)
CSN: [id number]     Arrival date & time 10/12/15  1436 History   First MD Initiated Contact with Patient 10/12/15 1616     Chief Complaint  Patient presents with  . Cough  . Asthma  . Chest Pain     (Consider location/radiation/quality/duration/timing/severity/associated sxs/prior Treatment) HPI Comments: Patient is a 25 year old female with history of asthma who presents today with 1 day history of dry cough, nasal congestion, shortness of breath on exertion, lightheadedness. The patient has history of similar when she gets sick. Patient states she also has associated chest pain when she takes a deep breath that she describes as tightness or shooting. Patient does not take any daily medications for her asthma and is out of her albuterol inhaler. Patient reports on ROS suprapubic pain since her period ended and states it feels similar to ovulatory pain. Patient has been getting multiple UTIs lately reports some decreased urine output and odor to her urine currently. Patient is sexually active and denies any abnormal discharge. Her last initial period was April 5. Patient endorses that she is a smoker, but is trying to stop. She smokes only when she is stressed. Patient denies taking any long trips recently, surgeries, cancer, or use of birth control.  Patient is a 25 y.o. female presenting with cough, asthma, and chest pain. The history is provided by the patient.  Cough Associated symptoms: chest pain and shortness of breath   Associated symptoms: no chills, no fever, no headaches, no rash and no sore throat   Asthma Associated symptoms include abdominal pain, chest pain, congestion and coughing. Pertinent negatives include no chills, fever, headaches, nausea, rash, sore throat or vomiting.  Chest Pain Associated symptoms: abdominal pain, cough and shortness of breath   Associated symptoms: no back pain, no fever, no headache, no nausea and not vomiting     Past Medical History  Diagnosis  Date  . Asthma   . Chlamydia 08/06/11    treated at Urgent Care  . Gonorrhea 04/20/12    treated at Urgent Care  . Migraine   . Trichomonas contact, treated    Past Surgical History  Procedure Laterality Date  . No past surgeries     Family History  Problem Relation Age of Onset  . Hypertension Other    Social History  Substance Use Topics  . Smoking status: Former Smoker -- 0.30 packs/day    Types: Cigarettes    Quit date: 08/30/2014  . Smokeless tobacco: Never Used  . Alcohol Use: Yes     Comment: OCCASIONAL   OB History    Gravida Para Term Preterm AB TAB SAB Ectopic Multiple Living   1 0   1  1   0     Review of Systems  Constitutional: Negative for fever and chills.  HENT: Positive for congestion. Negative for facial swelling and sore throat.   Respiratory: Positive for cough, chest tightness and shortness of breath.   Cardiovascular: Positive for chest pain.  Gastrointestinal: Positive for abdominal pain. Negative for nausea and vomiting.  Genitourinary: Positive for dysuria and decreased urine volume.  Musculoskeletal: Negative for back pain.  Skin: Negative for rash and wound.  Neurological: Positive for light-headedness. Negative for headaches.  Psychiatric/Behavioral: The patient is not nervous/anxious.       Allergies  Review of patient's allergies indicates no known allergies.  Home Medications   Prior to Admission medications   Medication Sig Start Date End Date Taking? Authorizing Provider  albuterol (PROVENTIL HFA;VENTOLIN  HFA) 108 (90 Base) MCG/ACT inhaler Inhale 2 puffs into the lungs every 6 (six) hours as needed for wheezing or shortness of breath. 10/12/15   Monique Hefty M Nikkie Liming, PA-C  benzonatate (TESSALON) 100 MG capsule Take 1 capsule (100 mg total) by mouth 3 (three) times daily as needed for cough. 10/12/15   Vada Swift M Enas Winchel, PA-C  cetirizine-pseudoephedrine (ZYRTEC-D) 5-120 MG tablet Take 1 tablet by mouth daily. 10/12/15   Emi HolesAlexandra M Anyssa Sharpless, PA-C   ibuprofen (ADVIL,MOTRIN) 800 MG tablet Take 1 tablet (800 mg total) by mouth every 8 (eight) hours as needed for headache, moderate pain or cramping. Patient not taking: Reported on 08/04/2015 03/22/15   Dorathy KinsmanVirginia Smith, CNM  nitrofurantoin, macrocrystal-monohydrate, (MACROBID) 100 MG capsule Take 1 capsule (100 mg total) by mouth 2 (two) times daily. 08/31/15   Lori A Clemmons, CNM  phenazopyridine (PYRIDIUM) 200 MG tablet Take 1 tablet (200 mg total) by mouth 3 (three) times daily as needed for pain. 08/31/15   Lori A Clemmons, CNM  predniSONE (DELTASONE) 20 MG tablet Take 3 tablets (60 mg total) by mouth once. 10/12/15   Emilie Carp M Telisha Zawadzki, PA-C   BP 132/63 mmHg  Pulse 83  Temp(Src) 98.4 F (36.9 C) (Oral)  Resp 16  Wt 51.512 kg  SpO2 100%  LMP 09/28/2015 Physical Exam  Constitutional: She appears well-developed and well-nourished. No distress.  HENT:  Head: Normocephalic and atraumatic.  Mouth/Throat: Oropharynx is clear and moist. No oropharyngeal exudate.  Eyes: Conjunctivae are normal. Pupils are equal, round, and reactive to light. Right eye exhibits no discharge. Left eye exhibits no discharge. No scleral icterus.  Neck: Normal range of motion. Neck supple. No thyromegaly present.  Cardiovascular: Normal rate, regular rhythm, normal heart sounds and intact distal pulses.  Exam reveals no gallop and no friction rub.   No murmur heard. Pulmonary/Chest: Effort normal and breath sounds normal. No stridor. No respiratory distress. She has no wheezes. She has no rales. She exhibits no tenderness.  Abdominal: Soft. Bowel sounds are normal. She exhibits no distension. There is tenderness in the suprapubic area. There is no rebound and no guarding.  Musculoskeletal: She exhibits no edema.  Lymphadenopathy:    She has no cervical adenopathy.  Neurological: She is alert. Coordination normal.  Skin: Skin is warm and dry. No rash noted. She is not diaphoretic. No pallor.  Psychiatric: She has a  normal mood and affect.  Nursing note and vitals reviewed.   ED Course  Procedures (including critical care time) Labs Review Labs Reviewed  URINALYSIS, ROUTINE W REFLEX MICROSCOPIC (NOT AT [facility]) - Abnormal; Notable for the following:    APPearance CLOUDY (*)    All other components within normal limits  POC URINE PREG, ED    Imaging Review Dg Chest 2 View  10/12/2015  CLINICAL DATA:  Cough and shortness of breath. Midsternal chest pain for 1 day. Smoker. Asthma. EXAM: CHEST  2 VIEW COMPARISON:  None. FINDINGS: Normal sized heart.  Clear lungs.  Minimal scoliosis. IMPRESSION: No acute abnormality. Electronically Signed   By: Beckie SaltsSteven  Reid M.D.   On: 10/12/2015 15:40   I have personally reviewed and evaluated these images and lab results as part of my medical decision-making.   EKG Interpretation   Date/Time:  Saturday October 12 2015 17:51:21 EDT Ventricular Rate:  66 PR Interval:  142 QRS Duration: 79 QT Interval:  366 QTC Calculation: 383 R Axis:   82 Text Interpretation:  Sinus rhythm No significant change since last  tracing  Confirmed by [facility] MD, ERIN ([id number]) on 10/12/2015 7:41:40 PM      MDM   Patient ambulated in ED with O2 saturations maintained >90, no current signs of respiratory distress. Lung exam improved after nebulizer treatment. Prednisone given in the ED. Pt states they are breathing well and denies shortness of breath on walking. Pt has been instructed to continue using prescribed medications and to follow up with PCP to establish care and follow up on today's exacerbation. UA unremarkable, urine preg negative. Patient denies any abnormal vaginal d/c or bleeding. Patient offered pelvic exam for further exploration of suprapubic pain, but she would like to follow up with PCP if the pain does not go away or worsens. Strict return precautions given. Patient discussed with Dr. Dalene Seltzer who is in agreement with plan. Patient stable and sats in upper 90s and 100 on  ambulation before discharge- patient denied shortness of breath and lightheadedness at this time. Patient discharged with refill of albuterol inhaler, Zyrtec, Tessalon, prednisone.  Final diagnoses:  Asthma, unspecified asthma severity, with acute exacerbation  Shortness of breath on exertion  Cough       Emi Holes, PA-C 10/12/15 1949  Alvira Monday, MD 10/14/15 1413

## 2015-10-12 NOTE — Discharge Instructions (Signed)
Medications: Prednisone, Zyrtec-D, Tessalon, albuterol inhaler  Treatment: Please take prednisone as prescribed. Please take Zyrtec-D as prescribed until your symptoms are resolved. He may take Tessalon every 8 hours as needed for cough. Use your albuterol inhaler as needed for shortness of breath and chest tightness.  Follow-up: Please follow-up with the primary care provider listed in your discharge paperwork to establish care and follow-up your asthma symptoms. Please return to emergency department if you develop any new or worsening symptoms, you cannot breathe, develop chest pain, or any other concerning symptoms.   Asthma, Adult Asthma is a recurring condition in which the airways tighten and narrow. Asthma can make it difficult to breathe. It can cause coughing, wheezing, and shortness of breath. Asthma episodes, also called asthma attacks, range from minor to life-threatening. Asthma cannot be cured, but medicines and lifestyle changes can help control it. CAUSES Asthma is believed to be caused by inherited (genetic) and environmental factors, but its exact cause is unknown. Asthma may be triggered by allergens, lung infections, or irritants in the air. Asthma triggers are different for each person. Common triggers include:   Animal dander.  Dust mites.  Cockroaches.  Pollen from trees or grass.  Mold.  Smoke.  Air pollutants such as dust, household cleaners, hair sprays, aerosol sprays, paint fumes, strong chemicals, or strong odors.  Cold air, weather changes, and winds (which increase molds and pollens in the air).  Strong emotional expressions such as crying or laughing hard.  Stress.  Certain medicines (such as aspirin) or types of drugs (such as beta-blockers).  Sulfites in foods and drinks. Foods and drinks that may contain sulfites include dried fruit, potato chips, and sparkling grape juice.  Infections or inflammatory conditions such as the flu, a cold, or an  inflammation of the nasal membranes (rhinitis).  Gastroesophageal reflux disease (GERD).  Exercise or strenuous activity. SYMPTOMS Symptoms may occur immediately after asthma is triggered or many hours later. Symptoms include:  Wheezing.  Excessive nighttime or early morning coughing.  Frequent or severe coughing with a common cold.  Chest tightness.  Shortness of breath. DIAGNOSIS  The diagnosis of asthma is made by a review of your medical history and a physical exam. Tests may also be performed. These may include:  Lung function studies. These tests show how much air you breathe in and out.  Allergy tests.  Imaging tests such as X-rays. TREATMENT  Asthma cannot be cured, but it can usually be controlled. Treatment involves identifying and avoiding your asthma triggers. It also involves medicines. There are 2 classes of medicine used for asthma treatment:   Controller medicines. These prevent asthma symptoms from occurring. They are usually taken every day.  Reliever or rescue medicines. These quickly relieve asthma symptoms. They are used as needed and provide short-term relief. Your health care provider will help you create an asthma action plan. An asthma action plan is a written plan for managing and treating your asthma attacks. It includes a list of your asthma triggers and how they may be avoided. It also includes information on when medicines should be taken and when their dosage should be changed. An action plan may also involve the use of a device called a peak flow meter. A peak flow meter measures how well the lungs are working. It helps you monitor your condition. HOME CARE INSTRUCTIONS   Take medicines only as directed by your health care provider. Speak with your health care provider if you have questions about how or  when to take the medicines.  Use a peak flow meter as directed by your health care provider. Record and keep track of readings.  Understand and  use the action plan to help minimize or stop an asthma attack without needing to seek medical care.  Control your home environment in the following ways to help prevent asthma attacks:  Do not smoke. Avoid being exposed to secondhand smoke.  Change your heating and air conditioning filter regularly.  Limit your use of fireplaces and wood stoves.  Get rid of pests (such as roaches and mice) and their droppings.  Throw away plants if you see mold on them.  Clean your floors and dust regularly. Use unscented cleaning products.  Try to have someone else vacuum for you regularly. Stay out of rooms while they are being vacuumed and for a short while afterward. If you vacuum, use a dust mask from a hardware store, a double-layered or microfilter vacuum cleaner bag, or a vacuum cleaner with a HEPA filter.  Replace carpet with wood, tile, or vinyl flooring. Carpet can trap dander and dust.  Use allergy-proof pillows, mattress covers, and box spring covers.  Wash bed sheets and blankets every week in hot water and dry them in a dryer.  Use blankets that are made of polyester or cotton.  Clean bathrooms and kitchens with bleach. If possible, have someone repaint the walls in these rooms with mold-resistant paint. Keep out of the rooms that are being cleaned and painted.  Wash hands frequently. SEEK MEDICAL CARE IF:   You have wheezing, shortness of breath, or a cough even if taking medicine to prevent attacks.  The colored mucus you cough up (sputum) is thicker than usual.  Your sputum changes from clear or white to yellow, green, gray, or bloody.  You have any problems that may be related to the medicines you are taking (such as a rash, itching, swelling, or trouble breathing).  You are using a reliever medicine more than 2-3 times per week.  Your peak flow is still at 50-79% of your personal best after following your action plan for 1 hour.  You have a fever. SEEK IMMEDIATE MEDICAL  CARE IF:   You seem to be getting worse and are unresponsive to treatment during an asthma attack.  You are short of breath even at rest.  You get short of breath when doing very little physical activity.  You have difficulty eating, drinking, or talking due to asthma symptoms.  You develop chest pain.  You develop a fast heartbeat.  You have a bluish color to your lips or fingernails.  You are light-headed, dizzy, or faint.  Your peak flow is less than 50% of your personal best.   This information is not intended to replace advice given to you by your health care provider. Make sure you discuss any questions you have with your health care provider.   Document Released: 06/15/2005 Document Revised: 03/06/2015 Document Reviewed: 01/12/2013 Elsevier Interactive Patient Education [employer]

## 2015-10-12 NOTE — ED Notes (Signed)
Ambulated in hall.  O2 sat stayed in upper 90's to 100% throughout walk.

## 2015-10-12 NOTE — ED Notes (Signed)
Pt reports recent cough. Having sob and cp that occurs more when taking a deep breath. Has hx of asthma but is out of her inhaler. No resp distress noted at triage, spo2 100%.

## 2015-10-25 ENCOUNTER — Encounter (HOSPITAL_COMMUNITY): Payer: Self-pay | Admitting: *Deleted

## 2015-10-25 ENCOUNTER — Emergency Department (HOSPITAL_COMMUNITY)
Admission: EM | Admit: 2015-10-25 | Discharge: 2015-10-25 | Disposition: A | Discharge status: Home / Self Care | Payer: Self-pay | Attending: Emergency Medicine | Admitting: Emergency Medicine

## 2015-10-25 DIAGNOSIS — J45901 Unspecified asthma with (acute) exacerbation: Secondary | ICD-10-CM | POA: Insufficient documentation

## 2015-10-25 NOTE — ED Notes (Signed)
Pt advising shes leaving AMA.

## 2015-10-25 NOTE — ED Notes (Signed)
Pt arrived by [facility], reports hx of asthma and sudden onset of sob while at work. Pt was given 5mg  alb neb and relieved of sob, lungs clear pta, spo2 100%.

## 2015-12-25 ENCOUNTER — Encounter (HOSPITAL_COMMUNITY): Payer: Self-pay | Admitting: Emergency Medicine

## 2015-12-25 ENCOUNTER — Ambulatory Visit (HOSPITAL_COMMUNITY)
Admission: EM | Admit: 2015-12-25 | Discharge: 2015-12-25 | Disposition: A | Discharge status: Home / Self Care | Payer: Self-pay | Attending: Family Medicine | Admitting: Family Medicine

## 2015-12-25 DIAGNOSIS — Z87891 Personal history of nicotine dependence: Secondary | ICD-10-CM | POA: Insufficient documentation

## 2015-12-25 DIAGNOSIS — N760 Acute vaginitis: Secondary | ICD-10-CM | POA: Insufficient documentation

## 2015-12-25 DIAGNOSIS — R109 Unspecified abdominal pain: Secondary | ICD-10-CM | POA: Insufficient documentation

## 2015-12-25 DIAGNOSIS — N923 Ovulation bleeding: Secondary | ICD-10-CM

## 2015-12-25 DIAGNOSIS — K5901 Slow transit constipation: Secondary | ICD-10-CM | POA: Insufficient documentation

## 2015-12-25 DIAGNOSIS — B9689 Other specified bacterial agents as the cause of diseases classified elsewhere: Secondary | ICD-10-CM

## 2015-12-25 DIAGNOSIS — Z79899 Other long term (current) drug therapy: Secondary | ICD-10-CM | POA: Insufficient documentation

## 2015-12-25 DIAGNOSIS — N939 Abnormal uterine and vaginal bleeding, unspecified: Secondary | ICD-10-CM | POA: Insufficient documentation

## 2015-12-25 DIAGNOSIS — N898 Other specified noninflammatory disorders of vagina: Secondary | ICD-10-CM

## 2015-12-25 DIAGNOSIS — A499 Bacterial infection, unspecified: Secondary | ICD-10-CM

## 2015-12-25 LAB — POCT URINALYSIS DIP (DEVICE)
BILIRUBIN URINE: NEGATIVE
Glucose, UA: NEGATIVE mg/dL
HGB URINE DIPSTICK: NEGATIVE
KETONES UR: NEGATIVE mg/dL
LEUKOCYTES UA: NEGATIVE
NITRITE: NEGATIVE
PH: 6 (ref 5.0–8.0)
Protein, ur: NEGATIVE mg/dL
Specific Gravity, Urine: 1.02 (ref 1.005–1.030)
Urobilinogen, UA: 0.2 mg/dL (ref 0.0–1.0)

## 2015-12-25 LAB — POCT PREGNANCY, URINE: Preg Test, Ur: NEGATIVE

## 2015-12-25 MED ORDER — METRONIDAZOLE 500 MG PO TABS: 500.0000 mg | ORAL_TABLET | Freq: Two times a day (BID) | ORAL | Status: DC

## 2015-12-25 NOTE — Discharge Instructions (Signed)
Abnormal Uterine Bleeding Abnormal uterine bleeding means bleeding from the vagina that is not your normal menstrual period. This can be:  Bleeding or spotting between periods.  Bleeding after sex (sexual intercourse).  Bleeding that is heavier or more than normal.  Periods that last longer than usual.  Bleeding after menopause. There are many problems that may cause this. Treatment will depend on the cause of the bleeding. Any kind of bleeding that is not normal should be reviewed by your doctor.  HOME CARE Watch your condition for any changes. These actions may lessen any discomfort you are having:  Do not use tampons or douches as told by your doctor.  Change your pads often. You should get regular pelvic exams and Pap tests. Keep all appointments for tests as told by your doctor. GET HELP IF:  You are bleeding for more than 1 week.  You feel dizzy at times. GET HELP RIGHT AWAY IF:   You pass out.  You have to change pads every 15 to 30 minutes.  You have belly pain.  You have a fever.  You become sweaty or weak.  You are passing large blood clots from the vagina.  You feel sick to your stomach (nauseous) and throw up (vomit). MAKE SURE YOU:  Understand these instructions.  Will watch your condition.  Will get help right away if you are not doing well or get worse.   This information is not intended to replace advice given to you by your health care provider. Make sure you discuss any questions you have with your health care provider.   Document Released: 04/12/2009 Document Revised: 06/20/2013 Document Reviewed: 01/12/2013 Elsevier Interactive Patient Education 2016 Else[employer] Inc.  Bacterial Vaginosis Bacterial vaginosis is a vaginal infection that occurs when the normal balance of bacteria in the vagina is disrupted. It results from an overgrowth of certain bacteria. This is the most common vaginal infection in women of childbearing age. Treatment is  important to prevent complications, especially in pregnant women, as it can cause a premature delivery. CAUSES  Bacterial vaginosis is caused by an increase in harmful bacteria that are normally present in smaller amounts in the vagina. Several different kinds of bacteria can cause bacterial vaginosis. However, the reason that the condition develops is not fully understood. RISK FACTORS Certain activities or behaviors can put you at an increased risk of developing bacterial vaginosis, including:  Having a new sex partner or multiple sex partners.  Douching.  Using an intrauterine device (IUD) for contraception. Women do not get bacterial vaginosis from toilet seats, bedding, swimming pools, or contact with objects around them. SIGNS AND SYMPTOMS  Some women with bacterial vaginosis have no signs or symptoms. Common symptoms include:  Grey vaginal discharge.  A fishlike odor with discharge, especially after sexual intercourse.  Itching or burning of the vagina and vulva.  Burning or pain with urination. DIAGNOSIS  Your health care provider will take a medical history and examine the vagina for signs of bacterial vaginosis. A sample of vaginal fluid may be taken. Your health care provider will look at this sample under a microscope to check for bacteria and abnormal cells. A vaginal pH test may also be done.  TREATMENT  Bacterial vaginosis may be treated with antibiotic medicines. These may be given in the form of a pill or a vaginal cream. A second round of antibiotics may be prescribed if the condition comes back after treatment. Because bacterial vaginosis increases your risk for sexually transmitted diseases,  getting treated can help reduce your risk for chlamydia, gonorrhea, HIV, and herpes. HOME CARE INSTRUCTIONS   Only take over-the-counter or prescription medicines as directed by your health care provider.  If antibiotic medicine was prescribed, take it as directed. Make sure you  finish it even if you start to feel better.  Tell all sexual partners that you have a vaginal infection. They should see their health care provider and be treated if they have problems, such as a mild rash or itching.  During treatment, it is important that you follow these instructions:  Avoid sexual activity or use condoms correctly.  Do not douche.  Avoid alcohol as directed by your health care provider.  Avoid breastfeeding as directed by your health care provider. SEEK MEDICAL CARE IF:   Your symptoms are not improving after 3 days of treatment.  You have increased discharge or pain.  You have a fever. MAKE SURE YOU:   Understand these instructions.  Will watch your condition.  Will get help right away if you are not doing well or get worse. FOR MORE INFORMATION  Centers for Disease Control and Prevention, Division of STD Prevention: SolutionApps.co.zawww.cdc.gov/std American Sexual Health Association (ASHA): www.ashastd.org    This information is not intended to replace advice given to you by your health care provider. Make sure you discuss any questions you have with your health care provider.   Document Released: 06/15/2005 Document Revised: 07/06/2014 Document Reviewed: 01/25/2013 Elsevier Interactive Patient Education 2016 [employer]  Constipation, Adult  MiraLAX as directed. Drink plenty of fluids stay well-hydrated Include more fiber in your diet. Constipation is when a person has fewer than three bowel movements a week, has difficulty having a bowel movement, or has stools that are dry, hard, or larger than normal. As people grow older, constipation is more common. A low-fiber diet, not taking in enough fluids, and taking certain medicines may make constipation worse.  CAUSES   Certain medicines, such as antidepressants, pain medicine, iron supplements, antacids, and water pills.   Certain diseases, such as diabetes, irritable bowel syndrome (IBS), thyroid disease, or  depression.   Not drinking enough water.   Not eating enough fiber-rich foods.   Stress or travel.   Lack of physical activity or exercise.   Ignoring the urge to have a bowel movement.   Using laxatives too much.  SIGNS AND SYMPTOMS   Having fewer than three bowel movements a week.   Straining to have a bowel movement.   Having stools that are hard, dry, or larger than normal.   Feeling full or bloated.   Pain in the lower abdomen.   Not feeling relief after having a bowel movement.  DIAGNOSIS  Your health care provider will take a medical history and perform a physical exam. Further testing may be done for severe constipation. Some tests may include:  A barium enema X-ray to examine your rectum, colon, and, sometimes, your small intestine.   A sigmoidoscopy to examine your lower colon.   A colonoscopy to examine your entire colon. TREATMENT  Treatment will depend on the severity of your constipation and what is causing it. Some dietary treatments include drinking more fluids and eating more fiber-rich foods. Lifestyle treatments may include regular exercise. If these diet and lifestyle recommendations do not help, your health care provider may recommend taking over-the-counter laxative medicines to help you have bowel movements. Prescription medicines may be prescribed if over-the-counter medicines do not work.  HOME CARE INSTRUCTIONS   Eat  foods that have a lot of fiber, such as fruits, vegetables, whole grains, and beans.  Limit foods high in fat and processed sugars, such as french fries, hamburgers, cookies, candies, and soda.   A fiber supplement may be added to your diet if you cannot get enough fiber from foods.   Drink enough fluids to keep your urine clear or pale yellow.   Exercise regularly or as directed by your health care provider.   Go to the restroom when you have the urge to go. Do not hold it.   Only take over-the-counter or  prescription medicines as directed by your health care provider. Do not take other medicines for constipation without talking to your health care provider first.  SEEK IMMEDIATE MEDICAL CARE IF:   You have bright red blood in your stool.   Your constipation lasts for more than 4 days or gets worse.   You have abdominal or rectal pain.   You have thin, pencil-like stools.   You have unexplained weight loss. MAKE SURE YOU:   Understand these instructions.  Will watch your condition.  Will get help right away if you are not doing well or get worse.   This information is not intended to replace advice given to you by your health care provider. Make sure you discuss any questions you have with your health care provider.   Document Released: 03/13/2004 Document Revised: 07/06/2014 Document Reviewed: 03/27/2013 Elsevier Interactive Patient Education [employer] Elsevier Inc.

## 2015-12-25 NOTE — ED Notes (Signed)
Complains of abdominal pain and spotting.  Patient complains of "upset stomach" and explains this is being diarrhea, then constipated.  Patient has had symptoms for 3 days. Reports last period as 11/27/15

## 2015-12-25 NOTE — ED Provider Notes (Signed)
CSN: [id number]     Arrival date & time 12/25/15  1733 History   First MD Initiated Contact with Patient 12/25/15 1744     Chief Complaint  Patient presents with  . Abdominal Pain   (Consider location/radiation/quality/duration/timing/severity/associated sxs/prior Treatment) HPI Comments: 25 year old female complaining of vaginal spotting last week and lasting for just a few days. She has not had spotting and the past 3-4 days. She has also had some pelvic cramping and a light vaginal discharge. Last menstrual period was at the end of Mayspecific date stated. She is also had an upset stomach, headache and occasionally nausea without vomiting for the past 3-4 days. She endorses the possibility of constipation and that she is not having regular bowel movements. She does have occasional small amount of watery diarrhea.   Past Medical History  Diagnosis Date  . Asthma   . Chlamydia 08/06/11    treated at Urgent Care  . Gonorrhea 04/20/12    treated at Urgent Care  . Migraine   . Trichomonas contact, treated    Past Surgical History  Procedure Laterality Date  . No past surgeries     Family History  Problem Relation Age of Onset  . Hypertension Other    Social History  Substance Use Topics  . Smoking status: Former Smoker -- 0.30 packs/day    Types: Cigarettes    Quit date: 08/30/2014  . Smokeless tobacco: Never Used  . Alcohol Use: Yes     Comment: OCCASIONAL   OB History    Gravida Para Term Preterm AB TAB SAB Ectopic Multiple Living   1 0   1  1   0     Review of Systems  Constitutional: Negative for fever, chills and activity change.  Respiratory: Negative for cough and shortness of breath.   Cardiovascular: Negative for chest pain.  Gastrointestinal: Positive for nausea and constipation. Negative for vomiting and abdominal pain.  Genitourinary: Positive for vaginal bleeding and vaginal discharge. Negative for dysuria and flank pain.  Skin: Negative.   Neurological:  Positive for headaches.    Allergies  Review of patient's allergies indicates no known allergies.  Home Medications   Prior to Admission medications   Medication Sig Start Date End Date Taking? Authorizing Provider  albuterol (PROVENTIL HFA;VENTOLIN HFA) 108 (90 Base) MCG/ACT inhaler Inhale 2 puffs into the lungs every 6 (six) hours as needed for wheezing or shortness of breath. 10/12/15   Emi HolesAlexandra M Law, PA-C  metroNIDAZOLE (FLAGYL) 500 MG tablet Take 1 tablet (500 mg total) by mouth 2 (two) times daily. X 7 days 12/25/15   Hayden Rasmussenavid Holger Sokolowski, NP   Meds Ordered and Administered this Visit  Medications - No data to display  BP 111/60 mmHg  Pulse 68  Temp(Src) 97.7 F (36.5 C) (Oral)  Resp 12  SpO2 100%  LMP 11/27/2015 No data found.   Physical Exam  Constitutional: She is oriented to person, place, and time. She appears well-developed and well-nourished. No distress.  HENT:  Mouth/Throat: No oropharyngeal exudate.  Eyes: EOM are normal.  Neck: Normal range of motion. Neck supple.  Cardiovascular: Normal rate.   Pulmonary/Chest: Effort normal. No respiratory distress.  Abdominal: Soft. Bowel sounds are normal. She exhibits no distension.  Abdomen percusses tympanic in the epigastrium and: Most other areas. There is a small area of periumbilical protrusion consistent with stool mass. Minor tenderness to the left mid abdomen. No rebound or guarding.   Genitourinary:  Normal external female genitalia There is a  sippy white creamy discharge in the vaginal vault. Cervix is pink without erythema or other discoloration. No lesions. No evidence of bleeding. No CMT or adnexal tenderness.  Musculoskeletal: She exhibits no edema.  Neurological: She is alert and oriented to person, place, and time. She exhibits normal muscle tone.  Skin: Skin is warm and dry.  Psychiatric: She has a normal mood and affect.  Nursing note and vitals reviewed.   ED Course  Procedures (including critical care  time)  Labs Review Labs Reviewed  POCT URINALYSIS DIP (DEVICE)  POCT PREGNANCY, URINE  CERVICOVAGINAL ANCILLARY ONLY   Results for orders placed or performed during the hospital encounter of 12/25/15  POCT urinalysis dip (device)  Result Value Ref Range   Glucose, UA NEGATIVE NEGATIVE mg/dL   Bilirubin Urine NEGATIVE NEGATIVE   Ketones, ur NEGATIVE NEGATIVE mg/dL   Specific Gravity, Urine 1.020 1.005 - 1.030   Hgb urine dipstick NEGATIVE NEGATIVE   pH 6.0 5.0 - 8.0   Protein, ur NEGATIVE NEGATIVE mg/dL   Urobilinogen, UA 0.2 0.0 - 1.0 mg/dL   Nitrite NEGATIVE NEGATIVE   Leukocytes, UA NEGATIVE NEGATIVE  Pregnancy, urine POC  Result Value Ref Range   Preg Test, Ur NEGATIVE NEGATIVE    Imaging Review No results found.   Visual Acuity Review  Right Eye Distance:   Left Eye Distance:   Bilateral Distance:    Right Eye Near:   Left Eye Near:    Bilateral Near:         MDM   1. Slow transit constipation   2. Vaginal discharge   3. BV (bacterial vaginosis)   4. Vaginal bleeding between periods    MiraLAX as directed. Drink plenty of fluids stay well-hydrated Include more fiber in your diet. Meds ordered this encounter  Medications  . metroNIDAZOLE (FLAGYL) 500 MG tablet    Sig: Take 1 tablet (500 mg total) by mouth 2 (two) times daily. X 7 days    Dispense:  14 tablet    Refill:  0    Order Specific Question:  Supervising Provider    Answer:  Linna HoffKINDL, JAMES D [phone][[id number]]   For reccuring or persistent bleeding and/or pelvic pain recheck promptly. Follow-up with your primary care doctor as needed. Cervical cytology pending. Any additional treatment based on outcome.     Hayden Rasmussenavid Sufian Ravi, NP 12/25/15 [phone]

## 2015-12-26 LAB — CERVICOVAGINAL ANCILLARY ONLY
Chlamydia: NEGATIVE
Neisseria Gonorrhea: NEGATIVE

## 2015-12-27 LAB — CERVICOVAGINAL ANCILLARY ONLY: WET PREP (BD AFFIRM): POSITIVE — AB

## 2016-01-03 ENCOUNTER — Telehealth (HOSPITAL_COMMUNITY): Payer: Self-pay | Admitting: Emergency Medicine

## 2016-01-03 NOTE — ED Notes (Signed)
LM on pt's VM [phone] Need to give lab results from recent visit on 6/28 Also let pt know labs can be obtained from MyChart  Per Dr. Dayton ScrapeMurray,   Notes Recorded by Eustace MooreLaura W Murray, MD on 12/29/2015 at 9:12 AM Please let patient know that tests for gonorrhea/chlamydia were negative. Test for gardnerella (bacterial vaginosis) was positive; [facility]. Recheck or followup pcp/Yashika Amin if symptoms persist. LM

## 2016-01-03 NOTE — ED Notes (Signed)
Pt called back.... Lab results given. ... Reports she has not started Rx ... Adv pt to pick up Flagly as soon as she can... Safe sex info given... Pt verb understanding.

## 2016-01-27 ENCOUNTER — Encounter: Payer: Self-pay | Admitting: Internal Medicine

## 2016-05-07 ENCOUNTER — Emergency Department (HOSPITAL_COMMUNITY)
Admission: EM | Admit: 2016-05-07 | Discharge: 2016-05-07 | Disposition: A | Discharge status: Home / Self Care | Payer: Self-pay | Attending: Emergency Medicine | Admitting: Emergency Medicine

## 2016-05-07 ENCOUNTER — Encounter (HOSPITAL_COMMUNITY): Payer: Self-pay | Admitting: Emergency Medicine

## 2016-05-07 DIAGNOSIS — J45909 Unspecified asthma, uncomplicated: Secondary | ICD-10-CM | POA: Insufficient documentation

## 2016-05-07 DIAGNOSIS — Z87891 Personal history of nicotine dependence: Secondary | ICD-10-CM | POA: Insufficient documentation

## 2016-05-07 DIAGNOSIS — N3001 Acute cystitis with hematuria: Secondary | ICD-10-CM

## 2016-05-07 DIAGNOSIS — N12 Tubulo-interstitial nephritis, not specified as acute or chronic: Secondary | ICD-10-CM

## 2016-05-07 LAB — URINALYSIS, ROUTINE W REFLEX MICROSCOPIC
BILIRUBIN URINE: NEGATIVE
Glucose, UA: NEGATIVE mg/dL
KETONES UR: NEGATIVE mg/dL
NITRITE: NEGATIVE
PH: 6 (ref 5.0–8.0)
PROTEIN: NEGATIVE mg/dL
Specific Gravity, Urine: 1.014 (ref 1.005–1.030)

## 2016-05-07 LAB — URINE MICROSCOPIC-ADD ON

## 2016-05-07 LAB — POC URINE PREG, ED: PREG TEST UR: NEGATIVE

## 2016-05-07 MED ORDER — ONDANSETRON 4 MG PO TBDP
4.0000 mg | ORAL_TABLET | Freq: Once | ORAL | Status: AC
  Administered 2016-05-07: 4 mg via ORAL
  Filled 2016-05-07: qty 1

## 2016-05-07 MED ORDER — PROMETHAZINE HCL 25 MG PO TABS: 125.0000 mg | ORAL_TABLET | Freq: Four times a day (QID) | ORAL | 0 refills | Status: DC | PRN

## 2016-05-07 MED ORDER — CEPHALEXIN 500 MG PO CAPS: 500.0000 mg | ORAL_CAPSULE | Freq: Four times a day (QID) | ORAL | 0 refills | Status: DC

## 2016-05-07 MED ORDER — CEPHALEXIN 250 MG PO CAPS
500.0000 mg | ORAL_CAPSULE | Freq: Once | ORAL | Status: AC
  Administered 2016-05-07: 500 mg via ORAL
  Filled 2016-05-07: qty 2

## 2016-05-07 NOTE — ED Triage Notes (Signed)
Pt from home with c/o urinary frequency x 4 days with burning starting yesterday.  Pt also reports chills and nausea.  Hx of UTI.  Pt reports vaginal spotting yesterday.  Denies vaginal discharge.  Pt states she only has one sexual partner and does not use any birth control or STI protection.  NAD, A&O.

## 2016-05-07 NOTE — ED Notes (Signed)
Declined W/C at D/C and was escorted to lobby by RN. 

## 2016-05-07 NOTE — ED Provider Notes (Signed)
[facility]-EMERGENCY DEPT Provider Note   CSN: [id number] Arrival date & time: 05/07/16  1419     History   Chief Complaint Chief Complaint  Patient presents with  . Urinary Frequency    HPI Shari Lupe CarneyShanetta Davidson is a 25 y.o. female.   Urinary Frequency  This is a recurrent problem. The current episode started 2 days ago. The problem occurs constantly. The problem has not changed since onset.Associated symptoms include abdominal pain (suprapubic). Pertinent negatives include no chest pain, no headaches and no shortness of breath. Nothing aggravates the symptoms. Nothing relieves the symptoms. Treatments tried: phenopyrazidine.    Past Medical History:  Diagnosis Date  . Asthma   . Chlamydia 08/06/11   treated at Urgent Care  . Gonorrhea 04/20/12   treated at Urgent Care  . Migraine   . Trichomonas contact, treated     Patient Active Problem List   Diagnosis Date Noted  . Depression, major, recurrent, moderate (HCC)   . Vaginal yeast infection 04/18/2013  . Difficulty urinating 04/18/2013  . Dysuria 08/09/2012  . UTI (lower urinary tract infection) 10/22/2011  . Possible exposure to STD 08/27/2011  . Dysfunctional uterine bleeding 08/27/2011  . Contraception management 06/03/2011  . Vaginal bleeding 05/05/2011  . Lightheadedness 03/19/2011  . CONSTIPATION 07/28/2010  . ACNE, MILD 07/28/2010  . MYOPIA 07/08/2007  . MIGRAINE, UNSPEC., W/O INTRACTABLE MIGRAINE 08/26/2006  . RHINITIS, ALLERGIC 08/26/2006  . ASTHMA, INTERMITTENT 08/26/2006    Past Surgical History:  Procedure Laterality Date  . NO PAST SURGERIES      OB History    Gravida Para Term Preterm AB Living   1 0     1 0   SAB TAB Ectopic Multiple Live Births   1               Home Medications    Prior to Admission medications   Medication Sig Start Date End Date Taking? Authorizing Provider  albuterol (PROVENTIL HFA;VENTOLIN HFA) 108 (90 Base) MCG/ACT inhaler Inhale 2 puffs into the lungs every  6 (six) hours as needed for wheezing or shortness of breath. 10/12/15   Emi HolesAlexandra M Law, PA-C  cephALEXin (KEFLEX) 500 MG capsule Take 1 capsule (500 mg total) by mouth 4 (four) times daily. 05/07/16   Marily MemosJason Sennie Borden, MD  metroNIDAZOLE (FLAGYL) 500 MG tablet Take 1 tablet (500 mg total) by mouth 2 (two) times daily. X 7 days 12/25/15   Hayden Rasmussenavid Mabe, NP  promethazine (PHENERGAN) 25 MG tablet Take 5 tablets (125 mg total) by mouth every 6 (six) hours as needed for nausea or vomiting. 05/07/16   Marily MemosJason Janiece Scovill, MD    Family History Family History  Problem Relation Age of Onset  . Hypertension Other     Social History Social History  Substance Use Topics  . Smoking status: Former Smoker    Packs/day: 0.30    Types: Cigarettes    Quit date: 08/30/2014  . Smokeless tobacco: Never Used  . Alcohol use Yes     Comment: OCCASIONAL     Allergies   Patient has no known allergies.   Review of Systems Review of Systems  Respiratory: Negative for shortness of breath.   Cardiovascular: Negative for chest pain.  Gastrointestinal: Positive for abdominal pain (suprapubic).  Genitourinary: Positive for frequency.  Musculoskeletal: Positive for back pain.  Neurological: Negative for headaches.  All other systems reviewed and are negative.    Physical Exam Updated Vital Signs BP 126/68 (BP Location: Left Arm)   Pulse  73   Temp 98.3 F (36.8 C) (Oral)   Resp 14   LMP 04/14/2016 (Exact Date)   SpO2 100%   Physical Exam  Constitutional: She is oriented to person, place, and time. She appears well-developed and well-nourished.  HENT:  Head: Normocephalic and atraumatic.  Eyes: Conjunctivae and EOM are normal.  Neck: Normal range of motion.  Cardiovascular: Normal rate and regular rhythm.   Pulmonary/Chest: No stridor. No respiratory distress. She has no wheezes.  Abdominal: She exhibits no distension. There is tenderness (slight suprapubic).  Musculoskeletal: Normal range of motion. She  exhibits no edema, tenderness (specifically no CVA) or deformity.  Neurological: She is alert and oriented to person, place, and time. No cranial nerve deficit.  Skin: Skin is warm and dry.  Nursing note and vitals reviewed.    ED Treatments / Results  Labs (all labs ordered are listed, but only abnormal results are displayed) Labs Reviewed  URINALYSIS, ROUTINE W REFLEX MICROSCOPIC (NOT AT [facility]) - Abnormal; Notable for the following:       Result Value   Hgb urine dipstick MODERATE (*)    Leukocytes, UA TRACE (*)    All other components within normal limits  URINE MICROSCOPIC-ADD ON - Abnormal; Notable for the following:    Squamous Epithelial / LPF 6-30 (*)    Bacteria, UA FEW (*)    All other components within normal limits  URINE CULTURE  POC URINE PREG, ED    EKG  EKG Interpretation None       Radiology No results found.  Procedures Procedures (including critical care time)  Medications Ordered in ED Medications  cephALEXin (KEFLEX) capsule 500 mg (500 mg Oral Given 05/07/16 1610)  ondansetron (ZOFRAN-ODT) disintegrating tablet 4 mg (4 mg Oral Given 05/07/16 1610)     Initial Impression / Assessment and Plan / ED Course  I have reviewed the triage vital signs and the nursing notes.  Pertinent labs & imaging results that were available during my care of the patient were reviewed by me and considered in my medical decision making (see chart for details).  Clinical Course     UTI, possibly early pyelo. Has already taken a couple doses from old Rx of Nitrofurantoin, however if kidney involvement, will tx w/ keflex/zofran as well. Appears well, no tachycardia, tolerating PO. Will dc home on same. Culture sent.   Final Clinical Impressions(s) / ED Diagnoses   Final diagnoses:  Acute cystitis with hematuria  Pyelonephritis    New Prescriptions New Prescriptions   CEPHALEXIN (KEFLEX) 500 MG CAPSULE    Take 1 capsule (500 mg total) by mouth 4 (four) times  daily.   PROMETHAZINE (PHENERGAN) 25 MG TABLET    Take 5 tablets (125 mg total) by mouth every 6 (six) hours as needed for nausea or vomiting.     Marily MemosJason Vontrell Pullman, MD 05/07/16 [phone]

## 2016-05-08 ENCOUNTER — Emergency Department (HOSPITAL_COMMUNITY)
Admission: EM | Admit: 2016-05-08 | Discharge: 2016-05-08 | Disposition: A | Discharge status: Home / Self Care | Payer: Self-pay | Attending: Emergency Medicine | Admitting: Emergency Medicine

## 2016-05-08 ENCOUNTER — Encounter (HOSPITAL_COMMUNITY): Payer: Self-pay | Admitting: Emergency Medicine

## 2016-05-08 DIAGNOSIS — J45909 Unspecified asthma, uncomplicated: Secondary | ICD-10-CM | POA: Insufficient documentation

## 2016-05-08 DIAGNOSIS — N390 Urinary tract infection, site not specified: Secondary | ICD-10-CM | POA: Insufficient documentation

## 2016-05-08 DIAGNOSIS — Z87891 Personal history of nicotine dependence: Secondary | ICD-10-CM | POA: Insufficient documentation

## 2016-05-08 DIAGNOSIS — K5900 Constipation, unspecified: Secondary | ICD-10-CM | POA: Insufficient documentation

## 2016-05-08 MED ORDER — POLYETHYLENE GLYCOL 3350 17 G PO PACK: 17.0000 g | PACK | Freq: Every day | ORAL | 0 refills | Status: DC

## 2016-05-08 MED ORDER — CEFTRIAXONE SODIUM 1 G IJ SOLR
1.0000 g | Freq: Once | INTRAMUSCULAR | Status: AC
  Administered 2016-05-08: 1 g via INTRAMUSCULAR
  Filled 2016-05-08: qty 10

## 2016-05-08 MED ORDER — STERILE WATER FOR INJECTION IJ SOLN
INTRAMUSCULAR | Status: AC
  Administered 2016-05-08: 2.1 mL via INTRAMUSCULAR
  Filled 2016-05-08: qty 10

## 2016-05-08 MED ORDER — NAPROXEN 500 MG PO TABS: 500.0000 mg | ORAL_TABLET | Freq: Two times a day (BID) | ORAL | 0 refills | Status: DC | PRN

## 2016-05-08 MED ORDER — STERILE WATER FOR INJECTION IJ SOLN
2.0000 mL | Freq: Once | INTRAMUSCULAR | Status: AC
  Administered 2016-05-08: 2.1 mL via INTRAMUSCULAR

## 2016-05-08 NOTE — ED Triage Notes (Signed)
Pt was [facility] at [facility] yesterday and diagnosed with pyelonephritis and given caphalexin. Pt states she was in still in pain but they discharged her home. Pt states pain has worsened. Pt adds no BM for a week.

## 2016-05-08 NOTE — ED Provider Notes (Signed)
[facility]-EMERGENCY DEPT Provider Note   CSN: [id number] Arrival date & time: 05/08/16  1909     History   Chief Complaint Chief Complaint  Patient presents with  . Flank Pain    HPI Shari Davidson is a 25 y.o. female.  HPI  25 year old female with hx of recurrent UTI, depression, presenting c/o dysuria.  Pt report for the past 3 days she has had suprapubic pain, dysuria and sts sxs felt similar to prior UTI.  No fever, headache, cp, sob, vaginal discharge or rash.  [facility] for her complaint.  Her UA showing evidence suggestive of UTI.  Pt was prescribed Keflex and discharge.  She report taking the medication since last night but still having moderate lower abd pain and dysuria.  She also c/o not having a BM for the past 4 days.  Able to pass flatus and denies nausea or vomiting.  No prior abd surgery.  She sts "they did not address my pain last night".  Denies vaginal discharge or pain with sexual activity.  LMP 10/17  Past Medical History:  Diagnosis Date  . Asthma   . Chlamydia 08/06/11   treated at Urgent Care  . Gonorrhea 04/20/12   treated at Urgent Care  . Migraine   . Trichomonas contact, treated     Patient Active Problem List   Diagnosis Date Noted  . Depression, major, recurrent, moderate (HCC)   . Vaginal yeast infection 04/18/2013  . Difficulty urinating 04/18/2013  . Dysuria 08/09/2012  . UTI (lower urinary tract infection) 10/22/2011  . Possible exposure to STD 08/27/2011  . Dysfunctional uterine bleeding 08/27/2011  . Contraception management 06/03/2011  . Vaginal bleeding 05/05/2011  . Lightheadedness 03/19/2011  . CONSTIPATION 07/28/2010  . ACNE, MILD 07/28/2010  . MYOPIA 07/08/2007  . MIGRAINE, UNSPEC., W/O INTRACTABLE MIGRAINE 08/26/2006  . RHINITIS, ALLERGIC 08/26/2006  . ASTHMA, INTERMITTENT 08/26/2006    Past Surgical History:  Procedure Laterality Date  . NO PAST SURGERIES      OB History    Gravida Para Term Preterm AB Living   1 0     1 0   SAB TAB Ectopic Multiple Live Births   1               Home Medications    Prior to Admission medications   Medication Sig Start Date End Date Taking? Authorizing Provider  albuterol (PROVENTIL HFA;VENTOLIN HFA) 108 (90 Base) MCG/ACT inhaler Inhale 2 puffs into the lungs every 6 (six) hours as needed for wheezing or shortness of breath. 10/12/15   Emi HolesAlexandra M Law, PA-C  cephALEXin (KEFLEX) 500 MG capsule Take 1 capsule (500 mg total) by mouth 4 (four) times daily. 05/07/16   Marily MemosJason Mesner, MD  metroNIDAZOLE (FLAGYL) 500 MG tablet Take 1 tablet (500 mg total) by mouth 2 (two) times daily. X 7 days 12/25/15   Hayden Rasmussenavid Mabe, NP  promethazine (PHENERGAN) 25 MG tablet Take 5 tablets (125 mg total) by mouth every 6 (six) hours as needed for nausea or vomiting. 05/07/16   Marily MemosJason Mesner, MD    Family History Family History  Problem Relation Age of Onset  . Hypertension Other     Social History Social History  Substance Use Topics  . Smoking status: Former Smoker    Packs/day: 0.30    Types: Cigarettes    Quit date: 08/30/2014  . Smokeless tobacco: Never Used  . Alcohol use Yes  Comment: OCCASIONAL     Allergies   Patient has no known allergies.   Review of Systems Review of Systems  All other systems reviewed and are negative.    Physical Exam Updated Vital Signs BP 128/78 (BP Location: Right Arm)   Pulse 85   Temp 98.9 F (37.2 C) (Oral)   Resp 17   Ht 5\' 4"  (1.626 m)   Wt 53.1 kg   LMP 04/14/2016 (Exact Date)   SpO2 100%   BMI 20.08 kg/m   Physical Exam  Constitutional: She appears well-developed and well-nourished. No distress.  HENT:  Head: Atraumatic.  Eyes: Conjunctivae are normal.  Neck: Neck supple.  Cardiovascular: Normal rate and regular rhythm.   Pulmonary/Chest: Effort normal and breath sounds normal.  Abdominal: Soft. There is tenderness (mild suprapubic tenderness, no guarding or rebound  tenderness.).  Neurological: She is alert.  Skin: No rash noted.  Psychiatric: She has a normal mood and affect.  Nursing note and vitals reviewed.    ED Treatments / Results  Labs (all labs ordered are listed, but only abnormal results are displayed) Labs Reviewed - No data to display  EKG  EKG Interpretation None       Radiology No results found.  Procedures Procedures (including critical care time)  Medications Ordered in ED Medications - No data to display   Initial Impression / Assessment and Plan / ED Course  I have reviewed the triage vital signs and the nursing notes.  Pertinent labs & imaging results that were available during my care of the patient were reviewed by me and considered in my medical decision making (see chart for details).  Clinical Course     BP 107/81 (BP Location: Left Arm)   Pulse 87   Temp 98.8 F (37.1 C)   Resp 13   Ht 5\' 4"  (1.626 m)   Wt 53.1 kg   LMP 04/14/2016 (Exact Date)   SpO2 100%   BMI 20.08 kg/m    Final Clinical Impressions(s) / ED Diagnoses   Final diagnoses:  Urinary tract infection without hematuria, site unspecified  Constipation, unspecified constipation type    New Prescriptions Discharge Medication List as of 05/08/2016  8:43 PM    START taking these medications   Details  naproxen (NAPROSYN) 500 MG tablet Take 1 tablet (500 mg total) by mouth 2 (two) times daily as needed for moderate pain., Starting Fri 05/08/2016, Print    polyethylene glycol (MIRALAX / GLYCOLAX) packet Take 17 g by mouth daily., Starting Fri 05/08/2016, Print       Pt has UTI, was treated with Keflex last night.  Here with continued pain.  She has a non surgical abdomen. No CVA tenderness to suggest pyelo or kidney stone.  No vaginal discharge concerning for STD. Pt receive rocephin and encourage continue with Keflex. She complain of constipation.  No hx of abd surgery.  Doubt bowel obstruction.  Miralax prescribed.  Return  precaution given.    Shari HelperBowie Raegan Winders, PA-C 05/08/16 2114    Linwood DibblesJon Knapp, MD 05/11/16 1314

## 2016-05-08 NOTE — Discharge Instructions (Signed)
Continue taking your antibiotic for the full duration.  Take Miralax as needed for constipation.  Take Naproxen as needed for pain

## 2016-05-09 LAB — URINE CULTURE

## 2016-07-29 ENCOUNTER — Inpatient Hospital Stay (HOSPITAL_COMMUNITY)
Admission: AD | Admit: 2016-07-29 | Discharge: 2016-07-30 | Disposition: A | Discharge status: Home / Self Care | Payer: Self-pay | Source: Ambulatory Visit | Attending: Obstetrics and Gynecology | Admitting: Obstetrics and Gynecology

## 2016-07-29 ENCOUNTER — Encounter (HOSPITAL_COMMUNITY): Payer: Self-pay

## 2016-07-29 DIAGNOSIS — N939 Abnormal uterine and vaginal bleeding, unspecified: Secondary | ICD-10-CM | POA: Insufficient documentation

## 2016-07-29 DIAGNOSIS — Z87891 Personal history of nicotine dependence: Secondary | ICD-10-CM | POA: Insufficient documentation

## 2016-07-29 DIAGNOSIS — Z3202 Encounter for pregnancy test, result negative: Secondary | ICD-10-CM | POA: Insufficient documentation

## 2016-07-29 DIAGNOSIS — N760 Acute vaginitis: Secondary | ICD-10-CM | POA: Insufficient documentation

## 2016-07-29 DIAGNOSIS — B9689 Other specified bacterial agents as the cause of diseases classified elsewhere: Secondary | ICD-10-CM | POA: Insufficient documentation

## 2016-07-29 LAB — URINALYSIS, ROUTINE W REFLEX MICROSCOPIC
BACTERIA UA: NONE SEEN
Bilirubin Urine: NEGATIVE
Glucose, UA: NEGATIVE mg/dL
Ketones, ur: NEGATIVE mg/dL
Leukocytes, UA: NEGATIVE
Nitrite: NEGATIVE
Protein, ur: NEGATIVE mg/dL
Specific Gravity, Urine: 1.015 (ref 1.005–1.030)
pH: 5 (ref 5.0–8.0)

## 2016-07-29 LAB — PREGNANCY, URINE: PREG TEST UR: NEGATIVE

## 2016-07-29 LAB — WET PREP, GENITAL
Sperm: NONE SEEN
TRICH WET PREP: NONE SEEN
WBC, Wet Prep HPF POC: NONE SEEN
YEAST WET PREP: NONE SEEN

## 2016-07-29 LAB — POCT PREGNANCY, URINE: PREG TEST UR: NEGATIVE

## 2016-07-29 NOTE — MAU Note (Signed)
Pt here with c/o of brown discharge all day; nausea for the past few days. Had lighter period this month.

## 2016-07-29 NOTE — MAU Provider Note (Signed)
Chief Complaint: brown discharge and Nausea   First Provider Initiated Contact with Patient 07/30/16 0028      SUBJECTIVE HPI: Shari Davidson is a 26 y.o. G1P0010 female who presents to [facility]missions reporting brown discharge nausea today. Trying to conceive. Wonders if she is pregnant. Patient's last menstrual period was 07/19/2016 (exact date).   Associated signs and symptoms: Neg for fever, chills, vaginal discharge, abd pain, dyspareunia.   Past Medical History:  Diagnosis Date  . Asthma   . Chlamydia 08/06/11   treated at Urgent Care  . Gonorrhea 04/20/12   treated at Urgent Care  . Migraine   . Trichomonas contact, treated    OB History  Gravida Para Term Preterm AB Living  1 0     1 0  SAB TAB Ectopic Multiple Live Births  1            # Outcome Date GA Lbr Len/2nd Weight Sex Delivery Anes PTL Lv  1 SAB 2016             Past Surgical History:  Procedure Laterality Date  . NO PAST SURGERIES     Social History   Social History  . Marital status: Single    Spouse name: N/A  . Number of children: N/A  . Years of education: N/A   Occupational History  . Not on file.   Social History Main Topics  . Smoking status: Former Smoker    Packs/day: 0.30    Types: Cigarettes    Quit date: 08/30/2014  . Smokeless tobacco: Never Used  . Alcohol use Yes     Comment: OCCASIONAL  . Drug use: No  . Sexual activity: Yes    Birth control/ protection: None   Other Topics Concern  . Not on file   Social History Narrative    Lives with mother and 2 sisters   Family History  Problem Relation Age of Onset  . Hypertension Other    No current facility-administered medications on file prior to encounter.    Current Outpatient Prescriptions on File Prior to Encounter  Medication Sig Dispense Refill  . albuterol (PROVENTIL HFA;VENTOLIN HFA) 108 (90 Base) MCG/ACT inhaler Inhale 2 puffs into the lungs every 6 (six) hours as needed for wheezing or shortness  of breath. 1 Inhaler 0  . cephALEXin (KEFLEX) 500 MG capsule Take 1 capsule (500 mg total) by mouth 4 (four) times daily. 28 capsule 0  . naproxen (NAPROSYN) 500 MG tablet Take 1 tablet (500 mg total) by mouth 2 (two) times daily as needed for moderate pain. 20 tablet 0  . polyethylene glycol (MIRALAX / GLYCOLAX) packet Take 17 g by mouth daily. 14 each 0  . promethazine (PHENERGAN) 25 MG tablet Take 5 tablets (125 mg total) by mouth every 6 (six) hours as needed for nausea or vomiting. 30 tablet 0   No Known Allergies  I have reviewed patient's Past Medical Hx, Surgical Hx, Family Hx, Social Hx, medications and allergies.   Review of Systems  Constitutional: Negative for chills and fever.  Gastrointestinal: Positive for nausea. Negative for abdominal pain, constipation, diarrhea and vomiting.  Genitourinary: Positive for vaginal bleeding. Negative for dysuria, hematuria, vaginal discharge and vaginal pain.  Musculoskeletal: Negative for back pain.    OBJECTIVE Patient Vitals for the past 24 hrs:  BP Temp Temp src Pulse Resp Height Weight  07/29/16 2300 128/63 98.2 F (36.8 C) Oral 67 18 - -  07/29/16 2246 - - - - -  5\' 4"  (1.626 m) 124 lb (56.2 kg)   Constitutional: Well-developed, well-nourished female in no acute distress.  Cardiovascular: normal rate Respiratory: normal rate and effort.  GI: Abd soft, non-tender. MS: Extremities nontender, no edema, normal ROM Neurologic: Alert and oriented x 4.  GU: Neg CVAT.  SPECULUM EXAM: NEFG, small amount of light brown discharge, scant bleeding from os. Cervix clean  BIMANUAL: cervix closed; uterus normal size, no adnexal tenderness or masses. No CMT.  LAB RESULTS Results for orders placed or performed during the hospital encounter of 07/29/16 (from the past 24 hour(s))  Urinalysis, Routine w reflex microscopic     Status: Abnormal   Collection Time: 07/29/16 10:40 PM  Result Value Ref Range   Color, Urine YELLOW YELLOW   APPearance  HAZY (A) CLEAR   Specific Gravity, Urine 1.015 1.005 - 1.030   pH 5.0 5.0 - 8.0   Glucose, UA NEGATIVE NEGATIVE mg/dL   Hgb urine dipstick SMALL (A) NEGATIVE   Bilirubin Urine NEGATIVE NEGATIVE   Ketones, ur NEGATIVE NEGATIVE mg/dL   Protein, ur NEGATIVE NEGATIVE mg/dL   Nitrite NEGATIVE NEGATIVE   Leukocytes, UA NEGATIVE NEGATIVE   RBC / HPF 0-5 0 - 5 RBC/hpf   WBC, UA 0-5 0 - 5 WBC/hpf   Bacteria, UA NONE SEEN NONE SEEN   Squamous Epithelial / LPF 0-5 (A) NONE SEEN   Mucous PRESENT   Pregnancy, urine     Status: None   Collection Time: 07/29/16 10:40 PM  Result Value Ref Range   Preg Test, Ur NEGATIVE NEGATIVE  Pregnancy, urine POC     Status: None   Collection Time: 07/29/16 10:56 PM  Result Value Ref Range   Preg Test, Ur NEGATIVE NEGATIVE  Wet prep, genital     Status: Abnormal   Collection Time: 07/29/16 11:14 PM  Result Value Ref Range   Yeast Wet Prep HPF POC NONE SEEN NONE SEEN   Trich, Wet Prep NONE SEEN NONE SEEN   Clue Cells Wet Prep HPF POC PRESENT (A) NONE SEEN   WBC, Wet Prep HPF POC NONE SEEN NONE SEEN   Sperm NONE SEEN     IMAGING No results found.  MAU COURSE Orders Placed This Encounter  Procedures  . Wet prep, genital  . Urinalysis, Routine w reflex microscopic  . Pregnancy, urine  . HIV antibody (routine testing) (NOT for [facility])  . Lab instructions  . Pregnancy, urine POC  . Discharge patient   Meds ordered this encounter  Medications  . metroNIDAZOLE (FLAGYL) 500 MG tablet    Sig: Take 1 tablet (500 mg total) by mouth 2 (two) times daily. X 7 days    Dispense:  14 tablet    Refill:  0    Order Specific Question:   Supervising Provider    Answer:   Tilda Burrow [[id number]]    MDM - Intermenstrual spotting. Appears to be uterine, not from cervicitis. No evidence of pregnancy, PID or emergent condition. - BV. Rx Flagyl    ASSESSMENT 1. Abnormal uterine bleeding (AUB)   2. BV (bacterial vaginosis)     PLAN Discharge home in stable  condition. Bleeding precautions Preconception counseling done. Start Folic Acid.  Follow-up Information    Gynecologist Follow up.   Why:  for routine gynecology care and infertility       [facility] Follow up.   Why:  as needed in gynecology emergencies Contact information: [address] [phone] [address] [place]  [place] [address] [phone]         Allergies as of 07/30/2016   No Known Allergies     Medication List    STOP taking these medications   cephALEXin 500 MG capsule Commonly known as:  KEFLEX     TAKE these medications   albuterol 108 (90 Base) MCG/ACT inhaler Commonly known as:  PROVENTIL HFA;VENTOLIN HFA Inhale 2 puffs into the lungs every 6 (six) hours as needed for wheezing or shortness of breath.   metroNIDAZOLE 500 MG tablet Commonly known as:  FLAGYL Take 1 tablet (500 mg total) by mouth 2 (two) times daily. X 7 days   naproxen 500 MG tablet Commonly known as:  NAPROSYN Take 1 tablet (500 mg total) by mouth 2 (two) times daily as needed for moderate pain.   polyethylene glycol packet Commonly known as:  MIRALAX / GLYCOLAX Take 17 g by mouth daily.   promethazine 25 MG tablet Commonly known as:  PHENERGAN Take 5 tablets (125 mg total) by mouth every 6 (six) hours as needed for nausea or vomiting.        [facility], CNM 07/30/2016  1:25 AM

## 2016-07-30 DIAGNOSIS — N760 Acute vaginitis: Secondary | ICD-10-CM

## 2016-07-30 DIAGNOSIS — N939 Abnormal uterine and vaginal bleeding, unspecified: Secondary | ICD-10-CM

## 2016-07-30 DIAGNOSIS — B9689 Other specified bacterial agents as the cause of diseases classified elsewhere: Secondary | ICD-10-CM

## 2016-07-30 LAB — HIV ANTIBODY (ROUTINE TESTING W REFLEX): HIV Screen 4th Generation wRfx: NONREACTIVE

## 2016-07-30 MED ORDER — METRONIDAZOLE 500 MG PO TABS: 500.0000 mg | ORAL_TABLET | Freq: Two times a day (BID) | ORAL | 0 refills | Status: DC

## 2016-07-30 NOTE — Discharge Instructions (Signed)
Bacterial Vaginosis Bacterial vaginosis is a vaginal infection that occurs when the normal balance of bacteria in the vagina is disrupted. It results from an overgrowth of certain bacteria. This is the most common vaginal infection among women ages 55-44. Because bacterial vaginosis increases your risk for STIs (sexually transmitted infections), getting treated can help reduce your risk for chlamydia, gonorrhea, herpes, and HIV (human immunodeficiency virus). Treatment is also important for preventing complications in pregnant women, because this condition can cause an early (premature) delivery. What are the causes? This condition is caused by an increase in harmful bacteria that are normally present in small amounts in the vagina. However, the reason that the condition develops is not fully understood. What increases the risk? The following factors may make you more likely to develop this condition:  Having a new sexual partner or multiple sexual partners.  Having unprotected sex.  Douching.  Having an intrauterine device (IUD).  Smoking.  Drug and alcohol abuse.  Taking certain antibiotic medicines.  Being pregnant. You cannot get bacterial vaginosis from toilet seats, bedding, swimming pools, or contact with objects around you. What are the signs or symptoms? Symptoms of this condition include:  Grey or white vaginal discharge. The discharge can also be watery or foamy.  A fish-like odor with discharge, especially after sexual intercourse or during menstruation.  Itching in and around the vagina.  Burning or pain with urination. Some women with bacterial vaginosis have no signs or symptoms. How is this diagnosed? This condition is diagnosed based on:  Your medical history.  A physical exam of the vagina.  Testing a sample of vaginal fluid under a microscope to look for a large amount of bad bacteria or abnormal cells. Your health care provider may use a cotton swab or a  small wooden spatula to collect the sample. How is this treated? This condition is treated with antibiotics. These may be given as a pill, a vaginal cream, or a medicine that is put into the vagina (suppository). If the condition comes back after treatment, a second round of antibiotics may be needed. Follow these instructions at home: Medicines  Take over-the-counter and prescription medicines only as told by your health care provider.  Take or use your antibiotic as told by your health care provider. Do not stop taking or using the antibiotic even if you start to feel better. General instructions  If you have a female sexual partner, tell her that you have a vaginal infection. She should see her health care provider and be treated if she has symptoms. If you have a female sexual partner, he does not need treatment.  During treatment:  Avoid sexual activity until you finish treatment.  Do not douche.  Avoid alcohol as directed by your health care provider.  Avoid breastfeeding as directed by your health care provider.  Drink enough water and fluids to keep your urine clear or pale yellow.  Keep the area around your vagina and rectum clean.  Wash the area daily with warm water.  Wipe yourself from front to back after using the toilet.  Keep all follow-up visits as told by your health care provider. This is important. How is this prevented?  Do not douche.  Wash the outside of your vagina with warm water only.  Use protection when having sex. This includes latex condoms and dental dams.  Limit how many sexual partners you have. To help prevent bacterial vaginosis, it is best to have sex with just one partner (monogamous).  Make sure you and your sexual partner are tested for STIs.  Wear cotton or cotton-lined underwear.  Avoid wearing tight pants and pantyhose, especially during summer.  Limit the amount of alcohol that you drink.  Do not use any products that contain  nicotine or tobacco, such as cigarettes and e-cigarettes. If you need help quitting, ask your health care provider.  Do not use illegal drugs. Where to find more information:  Centers for Disease Control and Prevention: SolutionApps.co.zawww.cdc.gov/std  American Sexual Health Association (ASHA): www.ashastd.org  U.S. Department of Health and Health and safety inspectorHuman Services, Office on Women's Health: ConventionalMedicines.siwww.womenshealth.gov/ or http://www.anderson-williamson.info/https://www.womenshealth.gov/a-z-topics/bacterial-vaginosis Contact a health care provider if:  Your symptoms do not improve, even after treatment.  You have more discharge or pain when urinating.  You have a fever.  You have pain in your abdomen.  You have pain during sex.  You have vaginal bleeding between periods. Summary  Bacterial vaginosis is a vaginal infection that occurs when the normal balance of bacteria in the vagina is disrupted.  Because bacterial vaginosis increases your risk for STIs (sexually transmitted infections), getting treated can help reduce your risk for chlamydia, gonorrhea, herpes, and HIV (human immunodeficiency virus). Treatment is also important for preventing complications in pregnant women, because the condition can cause an early (premature) delivery.  This condition is treated with antibiotic medicines. These may be given as a pill, a vaginal cream, or a medicine that is put into the vagina (suppository). This information is not intended to replace advice given to you by your health care provider. Make sure you discuss any questions you have with your health care provider. Document Released: 06/15/2005 Document Revised: 02/29/2016 Document Reviewed: 02/29/2016 Elsevier Interactive Patient Education  2017 [employer]   Abnormal Uterine Bleeding Abnormal uterine bleeding can affect women at various stages in life, including teenagers, women in their reproductive years, pregnant women, and women who have reached menopause. Several kinds of uterine bleeding are  considered abnormal, including:  Bleeding or spotting between periods.  Bleeding after sexual intercourse.  Bleeding that is heavier or more than normal.  Periods that last longer than usual.  Bleeding after menopause. Many cases of abnormal uterine bleeding are minor and simple to treat, while others are more serious. Any type of abnormal bleeding should be evaluated by your health care provider. Treatment will depend on the cause of the bleeding. Follow these instructions at home: Monitor your condition for any changes. The following actions may help to alleviate any discomfort you are experiencing:  Avoid the use of tampons and douches as directed by your health care provider.  Change your pads frequently. You should get regular pelvic exams and Pap tests. Keep all follow-up appointments for diagnostic tests as directed by your health care provider. Contact a health care provider if:  Your bleeding lasts more than 1 week.  You feel dizzy at times. Get help right away if:  You pass out.  You are changing pads every 15 to 30 minutes.  You have abdominal pain.  You have a fever.  You become sweaty or weak.  You are passing large blood clots from the vagina.  You start to feel nauseous and vomit. This information is not intended to replace advice given to you by your health care provider. Make sure you discuss any questions you have with your health care provider. Document Released: 06/15/2005 Document Revised: 11/27/2015 Document Reviewed: 01/12/2013 Elsevier Interactive Patient Education  2017 [employer]   Preparing for Pregnancy If you are considering  becoming pregnant, make an appointment to see your regular health care provider to learn how to prepare for a safe and healthy pregnancy (preconception care). During a preconception care visit, your health care provider will:  Do a complete physical exam, including a Pap test.  Take a complete medical  history.  Give you information, answer your questions, and help you resolve problems. Preconception checklist Medical history  Tell your health care provider about any current or past medical conditions. Your pregnancy or your ability to become pregnant may be affected by chronic conditions, such as diabetes, chronic hypertension, and thyroid problems.  Include your family's medical history as well as your partner's medical history.  Tell your health care provider about any history of STIs (sexually transmitted infections).These can affect your pregnancy. In some cases, they can be passed to your baby. Discuss any concerns that you have about STIs.  If indicated, discuss the benefits of genetic testing. This testing will show whether there are any genetic conditions that may be passed from you or your partner to your baby.  Tell your health care provider about:  Any problems you have had with conception or pregnancy.  Any medicines you take. These include vitamins, herbal supplements, and over-the-counter medicines.  Your history of immunizations. Discuss any vaccinations that you may need. Diet  Ask your health care provider what to include in a healthy diet that has a balance of nutrients. This is especially important when you are pregnant or preparing to become pregnant.  Ask your health care provider to help you reach a healthy weight before pregnancy.  If you are overweight, you may be at higher risk for certain complications, such as high blood pressure, diabetes, and preterm birth.  If you are underweight, you are more likely to have a baby who has a low birth weight. Lifestyle, work, and home  Let your health care provider know:  About any lifestyle habits that you have, such as alcohol use, drug use, or smoking.  About recreational activities that may put you at risk during pregnancy, such as downhill skiing and certain exercise programs.  Tell your health care  provider about any international travel, especially any travel to places with an active [place] virus outbreak.  About harmful substances that you may be exposed to at work or at home. These include chemicals, pesticides, radiation, or even litter boxes.  If you do not feel safe at home. Mental health  Tell your health care provider about:  Any history of mental health conditions, including feelings of depression, sadness, or anxiety.  Any medicines that you take for a mental health condition. These include herbs and supplements. Home instructions to prepare for pregnancy  Lifestyle  Eat a balanced diet. This includes fresh fruits and vegetables, whole grains, lean meats, low-fat dairy products, healthy fats, and foods that are high in fiber. Ask to meet with a nutritionist or registered dietitian for assistance with meal planning and goals.  Get regular exercise. Try to be active for at least 30 minutes a day on most days of the week. Ask your health care provider which activities are safe during pregnancy.  Do not use any products that contain nicotine or tobacco, such as cigarettes and e-cigarettes. If you need help quitting, ask your health care provider.  Do not drink alcohol.  Do not take illegal drugs.  Maintain a healthy weight. Ask your health care provider what weight range is right for you. General instructions  Keep an accurate record  of your menstrual periods. This makes it easier for your health care provider to determine your baby's due date.  Begin taking prenatal vitamins and folic acid supplements daily as directed by your health care provider.  Manage any chronic conditions, such as high blood pressure and diabetes, as told by your health care provider. This is important. How do I know that I am pregnant? You may be pregnant if you have been sexually active and you miss your period. Symptoms of early pregnancy include:  Mild cramping.  Very light vaginal bleeding  (spotting).  Feeling unusually tired.  Nausea and vomiting (morning sickness). If you have any of these symptoms and you suspect that you might be pregnant, you can take a home pregnancy test. These tests check for a hormone in your urine (human chorionic gonadotropin, or hCG). A woman's body begins to make this hormone during early pregnancy. These tests are very accurate. Wait until at least the first day after you miss your period to take one. If the test shows that you are pregnant (you get a positive result), call your health care provider to make an appointment for prenatal care. What should I do if I become pregnant?  Make an appointment with your health care provider as soon as you suspect you are pregnant.  Do not use any products that contain nicotine, such as cigarettes, chewing tobacco, and e-cigarettes. If you need help quitting, ask your health care provider.  Do not drink alcoholic beverages. Alcohol is related to a number of birth defects.  Avoid toxic odors and chemicals.  You may continue to have sexual intercourse if it does not cause pain or other problems, such as vaginal bleeding. This information is not intended to replace advice given to you by your health care provider. Make sure you discuss any questions you have with your health care provider. Document Released: 05/28/2008 Document Revised: 02/11/2016 Document Reviewed: 01/05/2016 Elsevier Interactive Patient Education  2017 [employer].  Infertility Infertility is when you are unable to get pregnant (conceive) after a year of having sex regularly without using birth control. Infertility can also mean that a woman is not able to carry a pregnancy to full term. Both women and men can have fertility problems. What causes infertility? What Causes Infertility in Women?  There are many possible causes of infertility in women. For some women, the cause of infertility is not known (unexplained infertility). Infertility  can also be linked to more than one cause. Infertility problems in women can be caused by problems with the menstrual cycle or reproductive organs, certain medical conditions, and factors related to lifestyle and age.  Problems with your menstrual cycle can interfere with your ovaries producing eggs (ovulation). This can make it difficult to get pregnant. This includes having a menstrual cycle that is very long, very short, or irregular.  Problems with reproductive organs can include:  An abnormally narrow cervix or a cervix that does not remain closed during a pregnancy.  A blockage in your fallopian tubes.  An abnormally shaped uterus.  Uterine fibroids. This is a tissue mass (tumor) that can develop on your uterus.  Medical conditions that can affect a woman's fertility include:  Polycystic ovarian syndrome (PCOS). This is a hormonal disorder that can cause small cysts to grow on your ovaries. This is the most common cause of infertility in women.  Endometriosis. This is a condition in which the tissue that lines your uterus (endometrium) grows outside of its normal location.  Primary ovary insufficiency. This is when your ovaries stop producing eggs and hormones before the age of 51.  Sexually transmitted diseases, such as chlamydia or gonorrhea. These infections can cause scarring in your fallopian tubes. This makes it difficult for eggs to reach your uterus.  Autoimmune disorders. These are disorders in which your immune system attacks normal, healthy cells.  Hormone imbalances.  Other factors include:  Age. A woman's fertility declines with age, especially after her mid-30s.  Being under- or overweight.  Drinking too much alcohol.  Using drugs.  Exercising excessively.  Being exposed to environmental toxins, such as radiation, pesticides, and certain chemicals. What Causes Infertility in Men?  There are many causes of infertility in men. Infertility can be linked to  more than one cause. Infertility problems in men can be caused by problems with sperm or the reproductive organs, certain medical conditions, and factors related to lifestyle and age. Some men have unexplained infertility.  Problems with sperm. Infertility can result if there is a problem producing:  Enough sperm (low sperm count).  Enough normally-shaped sperm (sperm morphology).  Sperm that are able to reach the egg (poor motility).  Infertility can also be caused by:  A problem with hormones.  Enlarged veins (varicoceles), cysts (spermatoceles), or tumors of the testicles.  Sexual dysfunction.  Injury to the testicles.  A birth defect, such as not having the tubes that carry sperm (vas deferens).  Medical conditions that can affect a man's fertility include:  Diabetes.  Cancer treatments, such as chemotherapy or radiation.  Klinefelter syndrome. This is an inherited genetic disorder.  Thyroid problems, such as an under- or overactive thyroid.  Cystic fibrosis.  Sexually transmitted diseases.  Other factors include:  Age. A man's fertility declines with age.  Drinking too much alcohol.  Using drugs.  Being exposed to environmental toxins, such as pesticides and lead. What are the symptoms of infertility? Being unable to get pregnant after one year of having regular sex without using birth control is the only sign of infertility. How is infertility diagnosed? In order to be diagnosed with infertility, both partners will have a physical exam. Both partners will also have an extensive medical and sexual history taken. If there is no obvious reason for infertility, additional tests may be done. What Tests Will Women Have?  Women may first have tests to check whether they are ovulating each month. The tests may include:  Blood tests to check hormone levels.  An ultrasound of the ovaries. This looks for possible problems on or in the ovaries.  Taking a small sample  of the tissue that lines the uterus for examination under a microscope (endometrial biopsy). Women who are ovulating may have additional tests. These may include:  Hysterosalpingography.  This is an X-ray of the fallopian tubes and uterus taken after a specific type of dye is injected.  This test can show the shape of the uterus and whether the fallopian tubes are open.  Laparoscopy.  In this test, a lighted tube (laparoscope) is used to look for problems in the fallopian tubes and other female organs.  Transvaginal ultrasound.  This is an imaging test to check for abnormalities of the uterus and ovaries.  A health care provider can use this test to count the number of follicles on the ovaries.  Hysteroscopy.  This test involves using a lighted tube to examine the cervix and inside the uterus.  It is done to find any abnormalities inside the uterus. What Tests  Will Men Have?  Tests for mens infertility includes:  Semen tests to check sperm count, morphology, and motility.  Blood tests to check for hormone levels.  Taking a small sample of tissue from inside a testicle (biopsy). This is examined under a microscope.  Blood tests to check for genetic abnormalities (genetic testing). How are women treated for infertility? Treatment depends on the cause of infertility. Most cases of infertility in women are treated with medicine or surgery.  Women may take medicine to:  Correct ovulation problems.  Treat other health conditions, such as PCOS.  Surgery may be done to:  Repair damage to the ovaries, fallopian tubes, cervix, or uterus.  Remove growths from the uterus.  Remove scar tissue from the uterus, pelvis, or other female organs. How are men treated for infertility? Treatment depends on the cause of infertility. Most cases of infertility in men are treated with medicine or surgery.  Men may take medicine to:  Correct hormone problems.  Treat other health  conditions.  Treat sexual dysfunction.  Surgery may be done to:  Remove blockages in the reproductive tract.  Correct other structural problems of the reproductive tract. What is assisted reproductive technology? Assisted reproductive technology (ART) refers to all treatments and procedures that combine eggs and sperm outside the body to try to help a couple conceive. ART is often combined with fertility drugs to stimulate ovulation. Sometimes ART is done using eggs retrieved from another woman's body (donor eggs) or from previously frozen fertilized eggs (embryos). There are different types of ART. These include:  Intrauterine insemination (IUI).  In this procedure, sperm is placed directly into a woman's uterus with a long, thin tube.  This may be most effective for infertility caused by sperm problems, including low sperm count and low motility.  Can be used in combination with fertility drugs.  In vitro fertilization (IVF).  This is often done when a woman's fallopian tubes are blocked or when a man has low sperm counts.  Fertility drugs stimulate the ovaries to produce multiple eggs. Once mature, these eggs are removed from the body and combined with the sperm to be fertilized.  These fertilized eggs are then placed in the woman's uterus. This information is not intended to replace advice given to you by your health care provider. Make sure you discuss any questions you have with your health care provider. Document Released: 06/18/2003 Document Revised: 11/15/2015 Document Reviewed: 02/28/2014 Elsevier Interactive Patient Education  2017 [employer]

## 2016-07-31 LAB — GC/CHLAMYDIA PROBE AMP (~~LOC~~) NOT AT ARMC
CHLAMYDIA, DNA PROBE: NEGATIVE
Neisseria Gonorrhea: NEGATIVE

## 2016-08-23 ENCOUNTER — Emergency Department (HOSPITAL_COMMUNITY)
Admission: EM | Admit: 2016-08-23 | Discharge: 2016-08-23 | Disposition: A | Discharge status: Home / Self Care | Payer: Self-pay | Attending: Emergency Medicine | Admitting: Emergency Medicine

## 2016-08-23 ENCOUNTER — Encounter (HOSPITAL_COMMUNITY): Payer: Self-pay | Admitting: Emergency Medicine

## 2016-08-23 DIAGNOSIS — Z79899 Other long term (current) drug therapy: Secondary | ICD-10-CM | POA: Insufficient documentation

## 2016-08-23 DIAGNOSIS — J029 Acute pharyngitis, unspecified: Secondary | ICD-10-CM

## 2016-08-23 DIAGNOSIS — K5900 Constipation, unspecified: Secondary | ICD-10-CM

## 2016-08-23 DIAGNOSIS — J45909 Unspecified asthma, uncomplicated: Secondary | ICD-10-CM | POA: Insufficient documentation

## 2016-08-23 DIAGNOSIS — Z87891 Personal history of nicotine dependence: Secondary | ICD-10-CM | POA: Insufficient documentation

## 2016-08-23 LAB — RAPID STREP SCREEN (MED CTR MEBANE ONLY): Streptococcus, Group A Screen (Direct): NEGATIVE

## 2016-08-23 MED ORDER — DOCUSATE SODIUM 100 MG PO CAPS: 100.0000 mg | ORAL_CAPSULE | Freq: Two times a day (BID) | ORAL | 0 refills | Status: DC

## 2016-08-23 MED ORDER — POLYETHYLENE GLYCOL 3350 17 GM/SCOOP PO POWD: ORAL | 0 refills | Status: DC

## 2016-08-23 NOTE — ED Provider Notes (Signed)
[facility]-EMERGENCY DEPT Provider Note   CSN: [id number] Arrival date & time: 08/23/16 1659     History    Chief Complaint  Patient presents with  . Sore Throat  . Constipation     HPI Shari Davidson is a 26 y.o. female.  26yo F w/ asthma who p/w sore throat and constipation. She reports a few days of viral symptoms including sore throat, cough, nasal congestion. He denies any fevers, vomiting, diarrhea, or urinary symptoms. She also reports constipation for the past several days. She had a bowel movement today but had a lot of straining with it and reports some blood and mild pain afterwards. No heavy bleeding. She tried an enema 2-3 days ago with no success. She has taken TheraFlu one time for her symptoms but no other medications. No sick contacts, rash, or recent travel.  Past Medical History:  Diagnosis Date  . Asthma   . Chlamydia 08/06/11   treated at [facility]  . Gonorrhea 04/20/12   treated at [facility]gent Care  . Migraine   . Trichomonas contact, treated      Patient Active Problem List   Diagnosis Date Noted  . Depression, major, recurrent, moderate (HCC)   . Vaginal yeast infection 04/18/2013  . Difficulty urinating 04/18/2013  . Dysuria 08/09/2012  . UTI (lower urinary tract infection) 10/22/2011  . Possible exposure to STD 08/27/2011  . Dysfunctional uterine bleeding 08/27/2011  . Contraception management 06/03/2011  . Vaginal bleeding 05/05/2011  . Lightheadedness 03/19/2011  . CONSTIPATION 07/28/2010  . ACNE, MILD 07/28/2010  . MYOPIA 07/08/2007  . MIGRAINE, UNSPEC., W/O INTRACTABLE MIGRAINE 08/26/2006  . RHINITIS, ALLERGIC 08/26/2006  . ASTHMA, INTERMITTENT 08/26/2006    Past Surgical History:  Procedure Laterality Date  . NO PAST SURGERIES      OB History    Gravida Para Term Preterm AB Living   1 0     1 0   SAB TAB Ectopic Multiple Live Births   1                Home Medications    Prior to Admission medications   Medication  Sig Start Date End Date Taking? Authorizing Provider  albuterol (PROVENTIL HFA;VENTOLIN HFA) 108 (90 Base) MCG/ACT inhaler Inhale 2 puffs into the lungs every 6 (six) hours as needed for wheezing or shortness of breath. 10/12/15   Emi Holes, PA-C  docusate sodium (COLACE) 100 MG capsule Take 1 capsule (100 mg total) by mouth 2 (two) times daily. 08/23/16   Laurence Spates, MD  metroNIDAZOLE (FLAGYL) 500 MG tablet Take 1 tablet (500 mg total) by mouth 2 (two) times daily. X 7 days 07/30/16   Dorathy Kinsman, CNM  naproxen (NAPROSYN) 500 MG tablet Take 1 tablet (500 mg total) by mouth 2 (two) times daily as needed for moderate pain. 05/08/16   Fayrene Helper, PA-C  polyethylene glycol powder (GLYCOLAX/MIRALAX) powder Take 1 capful mixed in drink by mouth 1 to 3 times daily as needed for daily soft stools  OTC 08/23/16   Laurence Spates, MD  promethazine (PHENERGAN) 25 MG tablet Take 5 tablets (125 mg total) by mouth every 6 (six) hours as needed for nausea or vomiting. 05/07/16   Marily Memos, MD      Family History  Problem Relation Age of Onset  . Hypertension Other      Social History  Substance Use Topics  . Smoking status: Former Smoker    Packs/day: 0.30  Types: Cigarettes    Quit date: 08/30/2014  . Smokeless tobacco: Never Used  . Alcohol use Yes     Comment: OCCASIONAL     Allergies     Patient has no known allergies.    Review of Systems  10 Systems reviewed and are negative for acute change except as noted in the HPI.   Physical Exam Updated Vital Signs BP 119/77   Pulse 66   Temp 98.3 F (36.8 C) (Oral)   Resp 14   Ht 5\' 4"  (1.626 m)   Wt 120 lb (54.4 kg)   SpO2 100%   BMI 20.60 kg/m   Physical Exam  Constitutional: She is oriented to person, place, and time. She appears well-developed and well-nourished. No distress.  HENT:  Head: Normocephalic and atraumatic.  Mouth/Throat: Oropharynx is clear and moist.  Moist mucous membranes  Eyes:  Conjunctivae are normal. Pupils are equal, round, and reactive to light.  Neck: Neck supple.  Cardiovascular: Normal rate, regular rhythm and normal heart sounds.   No murmur heard. Pulmonary/Chest: Effort normal and breath sounds normal.  Abdominal: Soft. Bowel sounds are normal. She exhibits no distension. There is no tenderness.  Musculoskeletal: She exhibits no edema.  Lymphadenopathy:    She has no cervical adenopathy.  Neurological: She is alert and oriented to person, place, and time.  Fluent speech  Skin: Skin is warm and dry.  Psychiatric: She has a normal mood and affect. Judgment normal.  Nursing note and vitals reviewed.     ED Treatments / Results  Labs (all labs ordered are listed, but only abnormal results are displayed) Labs Reviewed  RAPID STREP SCREEN (NOT AT [facility])  CULTURE, GROUP A STREP ([facility])  POC URINE PREG, ED     EKG  EKG Interpretation  Date/Time:    Ventricular Rate:    PR Interval:    QRS Duration:   QT Interval:    QTC Calculation:   R Axis:     Text Interpretation:           Radiology No results found.  Procedures Procedures (including critical care time) Procedures  Medications Ordered in ED  Medications - No data to display   Initial Impression / Assessment and Plan / ED Course  I have reviewed the triage vital signs and the nursing notes.  Pertinent labs & imaging results that were available during my care of the patient were reviewed by me and considered in my medical decision making (see chart for details).     PT w/ a few days of viral symptoms associated with constipation and small amount of blood after straining to have bowel movements today. She was well-appearing on exam with normal vital signs. No oropharyngeal swelling or significant erythema. Strep negative. Her constipation with bleeding suggest anal fissure or external hemorrhoid. Educated patient on constipation treatment including good hydration, Colace, and  MiraLAX with titration to effect. Discussed supportive care for viral symptoms including Tylenol/Motrin as needed. Reviewed return precautions. Patient voiced understanding and was discharged in satisfactory condition.  Final Clinical Impressions(s) / ED Diagnoses   Final diagnoses:  Viral pharyngitis  Constipation, unspecified constipation type     New Prescriptions   DOCUSATE SODIUM (COLACE) 100 MG CAPSULE    Take 1 capsule (100 mg total) by mouth 2 (two) times daily.   POLYETHYLENE GLYCOL POWDER (GLYCOLAX/MIRALAX) POWDER    Take 1 capful mixed in drink by mouth 1 to 3 times daily as needed for daily soft stools  OTC       Laurence Spates, MD 08/23/16 [phone]

## 2016-08-23 NOTE — ED Triage Notes (Signed)
Pt here for sore throat and pain with cough; pt sts some constipation as well

## 2016-08-26 LAB — CULTURE, GROUP A STREP (THRC)

## 2017-02-09 ENCOUNTER — Emergency Department (HOSPITAL_COMMUNITY)
Admission: EM | Admit: 2017-02-09 | Discharge: 2017-02-10 | Disposition: A | Discharge status: Home / Self Care | Payer: Self-pay | Attending: Emergency Medicine | Admitting: Emergency Medicine

## 2017-02-09 ENCOUNTER — Encounter (HOSPITAL_COMMUNITY): Payer: Self-pay

## 2017-02-09 DIAGNOSIS — Z87891 Personal history of nicotine dependence: Secondary | ICD-10-CM | POA: Insufficient documentation

## 2017-02-09 DIAGNOSIS — R040 Epistaxis: Secondary | ICD-10-CM

## 2017-02-09 DIAGNOSIS — J45901 Unspecified asthma with (acute) exacerbation: Secondary | ICD-10-CM

## 2017-02-09 DIAGNOSIS — J069 Acute upper respiratory infection, unspecified: Secondary | ICD-10-CM

## 2017-02-09 DIAGNOSIS — J45909 Unspecified asthma, uncomplicated: Secondary | ICD-10-CM | POA: Insufficient documentation

## 2017-02-09 NOTE — ED Triage Notes (Signed)
Pt complains of URI sx for several days and she states that she's had a few nosebleeds

## 2017-02-10 MED ORDER — PREDNISONE 20 MG PO TABS
ORAL_TABLET | ORAL | Status: AC
  Filled 2017-02-10: qty 3

## 2017-02-10 MED ORDER — ALBUTEROL SULFATE HFA 108 (90 BASE) MCG/ACT IN AERS: 2.0000 | INHALATION_SPRAY | RESPIRATORY_TRACT | 3 refills | Status: DC | PRN

## 2017-02-10 MED ORDER — PREDNISONE 20 MG PO TABS
60.0000 mg | ORAL_TABLET | Freq: Once | ORAL | Status: AC
  Administered 2017-02-10: 60 mg via ORAL

## 2017-02-10 MED ORDER — PREDNISONE 20 MG PO TABS: 40.0000 mg | ORAL_TABLET | Freq: Every day | ORAL | 0 refills | Status: DC

## 2017-02-10 MED ORDER — ALBUTEROL SULFATE HFA 108 (90 BASE) MCG/ACT IN AERS
2.0000 | INHALATION_SPRAY | RESPIRATORY_TRACT | Status: DC | PRN
  Administered 2017-02-10: 2 via RESPIRATORY_TRACT

## 2017-02-10 MED ORDER — AEROCHAMBER PLUS FLO-VU MEDIUM MISC
1.0000 | Freq: Once | Status: AC
  Administered 2017-02-10: 1

## 2017-02-10 MED ORDER — ALBUTEROL SULFATE HFA 108 (90 BASE) MCG/ACT IN AERS
INHALATION_SPRAY | RESPIRATORY_TRACT | Status: AC
  Filled 2017-02-10: qty 6.7

## 2017-02-10 NOTE — ED Provider Notes (Signed)
[facility]-EMERGENCY DEPT Provider Note   CSN: [id number] Arrival date & time: 02/09/17  2148     History   Chief Complaint Chief Complaint  Patient presents with  . Epistaxis  . URI    HPI Shari Davidson is a 26 y.o. female with a hx of Asthma, migraine headache, STDs presents to the Emergency Department complaining of gradual, persistent, progressively worsening URI symptoms onset approximately 1 week ago. Patient reports she's had associated cough, nasal congestion, rhinorrhea, sinus fullness, sore throat, postnasal drip and mild chest tightness. Patient reports a history of asthma but does not currently have an albuterol rescue inhaler. She reports over the last 3 days she's had several episodes of epistaxis all of which were easily controlled with nasal pressure. She denies anticoagulation or aspirin usage. She denies digital trauma to the nose. She reports she has been blowing her nose a lot.  No treatments for her URI prior to arrival. Nothing makes her URI symptoms better or worse. Patient denies fevers or chills, headache, neck pain, neck stiffness, rash, chest pain, abdominal pain, nausea, vomiting, diarrhea, weakness, dizziness, syncope. Patient denies known tick bites.   The history is provided by the patient and medical records. No language interpreter was used.    Past Medical History:  Diagnosis Date  . Asthma   . Chlamydia 08/06/11   treated at Urgent Care  . Gonorrhea 04/20/12   treated at Urgent Care  . Migraine   . Trichomonas contact, treated     Patient Active Problem List   Diagnosis Date Noted  . Depression, major, recurrent, moderate (HCC)   . Vaginal yeast infection 04/18/2013  . Difficulty urinating 04/18/2013  . Dysuria 08/09/2012  . UTI (lower urinary tract infection) 10/22/2011  . Possible exposure to STD 08/27/2011  . Dysfunctional uterine bleeding 08/27/2011  . Contraception management 06/03/2011  . Vaginal bleeding 05/05/2011  .  Lightheadedness 03/19/2011  . CONSTIPATION 07/28/2010  . ACNE, MILD 07/28/2010  . MYOPIA 07/08/2007  . MIGRAINE, UNSPEC., W/O INTRACTABLE MIGRAINE 08/26/2006  . RHINITIS, ALLERGIC 08/26/2006  . ASTHMA, INTERMITTENT 08/26/2006    Past Surgical History:  Procedure Laterality Date  . NO PAST SURGERIES      OB History    Gravida Para Term Preterm AB Living   1 0     1 0   SAB TAB Ectopic Multiple Live Births   1               Home Medications    Prior to Admission medications   Medication Sig Start Date End Date Taking? Authorizing Provider  albuterol (PROVENTIL HFA;VENTOLIN HFA) 108 (90 Base) MCG/ACT inhaler Inhale 2 puffs into the lungs every 4 (four) hours as needed for wheezing or shortness of breath. 02/10/17   Temitope Griffing, Dahlia Client, PA-C  predniSONE (DELTASONE) 20 MG tablet Take 2 tablets (40 mg total) by mouth daily. 02/10/17   Albaro Deviney, Dahlia Client, PA-C    Family History Family History  Problem Relation Age of Onset  . Hypertension Other     Social History Social History  Substance Use Topics  . Smoking status: Former Smoker    Packs/day: 0.30    Types: Cigarettes    Quit date: 08/30/2014  . Smokeless tobacco: Never Used  . Alcohol use Yes     Comment: OCCASIONAL     Allergies   Patient has no known allergies.   Review of Systems Review of Systems  Constitutional: Negative for appetite change, diaphoresis, fatigue, fever and unexpected weight change.  HENT: Positive for congestion, nosebleeds, postnasal drip, rhinorrhea, sinus pressure and sore throat. Negative for mouth sores.   Eyes: Negative for visual disturbance.  Respiratory: Positive for cough, chest tightness, shortness of breath and wheezing.   Cardiovascular: Negative for chest pain.  Gastrointestinal: Negative for abdominal pain, constipation, diarrhea, nausea and vomiting.  Endocrine: Negative for polydipsia, polyphagia and polyuria.  Genitourinary: Negative for dysuria, frequency, hematuria  and urgency.  Musculoskeletal: Negative for back pain and neck stiffness.  Skin: Negative for rash.  Allergic/Immunologic: Negative for immunocompromised state.  Neurological: Negative for syncope, light-headedness and headaches.  Hematological: Does not bruise/bleed easily.  Psychiatric/Behavioral: Negative for sleep disturbance. The patient is not nervous/anxious.   All other systems reviewed and are negative.    Physical Exam Updated Vital Signs BP 118/61 (BP Location: Right Arm)   Pulse 84   Temp 99.1 F (37.3 C) (Oral)   Resp 16   LMP 01/12/2017   SpO2 100%   Physical Exam  Constitutional: She appears well-developed and well-nourished. No distress.  HENT:  Head: Normocephalic and atraumatic.  Right Ear: Tympanic membrane, external ear and ear canal normal.  Left Ear: Tympanic membrane, external ear and ear canal normal.  Nose: Mucosal edema and rhinorrhea present. No epistaxis. Right sinus exhibits no maxillary sinus tenderness and no frontal sinus tenderness. Left sinus exhibits no maxillary sinus tenderness and no frontal sinus tenderness.  Mouth/Throat: Uvula is midline and mucous membranes are normal. Mucous membranes are not pale and not cyanotic. No oropharyngeal exudate, posterior oropharyngeal edema, posterior oropharyngeal erythema or tonsillar abscesses.  Right nare with small superficial area of friability to the nasal septum; no current epistaxis  Eyes: Pupils are equal, round, and reactive to light. Conjunctivae are normal.  Neck: Normal range of motion and full passive range of motion without pain.  Cardiovascular: Normal rate and intact distal pulses.   Pulmonary/Chest: Effort normal. No accessory muscle usage or stridor. No tachypnea. No respiratory distress. She has wheezes (fine, expiratory).  Clear and equal breath sounds without focal wheezes, rhonchi, rales  Abdominal: Soft. There is no tenderness.  Musculoskeletal: Normal range of motion.    Lymphadenopathy:    She has no cervical adenopathy.  Neurological: She is alert.  Skin: Skin is warm and dry. No rash noted. She is not diaphoretic.  Psychiatric: She has a normal mood and affect.  Nursing note and vitals reviewed.    ED Treatments / Results   Procedures Procedures (including critical care time)  Medications Ordered in ED Medications  albuterol (PROVENTIL HFA;VENTOLIN HFA) 108 (90 Base) MCG/ACT inhaler 2 puff (not administered)  AEROCHAMBER PLUS FLO-VU MEDIUM MISC 1 each (not administered)  predniSONE (DELTASONE) tablet 60 mg (not administered)     Initial Impression / Assessment and Plan / ED Course  I have reviewed the triage vital signs and the nursing notes.  Pertinent labs & imaging results that were available during my care of the patient were reviewed by me and considered in my medical decision making (see chart for details).     Pt with URI ssx.  No fever and fine expiratory wheezes without focal breath sounds.  Doubt PNA.  No x-ray indicated at this time.  Patients symptoms are consistent with URI, likely viral etiology. Discussed that antibiotics are not indicated for viral infections. Pt will be discharged with symptomatic treatment.  Pt with hx of asthma.  No hypoxia.  Albuterol MDI and prednisone in the ED and for home.  Epistaxis from small area of  friability. Conservative treatments discussed.  Verbalizes understanding and is agreeable with plan. Pt is hemodynamically stable & in NAD prior to dc.   Final Clinical Impressions(s) / ED Diagnoses   Final diagnoses:  Viral upper respiratory tract infection  Anterior epistaxis  Mild asthma with exacerbation, unspecified whether persistent    New Prescriptions New Prescriptions   ALBUTEROL (PROVENTIL HFA;VENTOLIN HFA) 108 (90 BASE) MCG/ACT INHALER    Inhale 2 puffs into the lungs every 4 (four) hours as needed for wheezing or shortness of breath.   PREDNISONE (DELTASONE) 20 MG TABLET    Take 2  tablets (40 mg total) by mouth daily.     Mailey Landstrom, Boyd KerbsHannah, PA-C 02/10/17 0140    Palumbo, April, MD 02/10/17 [phone]

## 2017-02-10 NOTE — Discharge Instructions (Signed)
1. Medications: albuterol, prednisone, usual home medications 2. Treatment: rest, drink plenty of fluids, take tylenol or ibuprofen for fever control 3. Follow Up: Please followup with your primary doctor in 3 days for discussion of your diagnoses and further evaluation after today's visit; if you do not have a primary care doctor use the resource guide provided to find one; Return to the ER for high fevers, difficulty breathing or other concerning symptoms

## 2017-03-28 ENCOUNTER — Emergency Department (HOSPITAL_COMMUNITY): Admission: EM | Admit: 2017-03-28 | Discharge: 2017-03-28 | Discharge status: Against Medical Advice | Payer: Self-pay

## 2017-03-28 ENCOUNTER — Emergency Department (HOSPITAL_COMMUNITY)
Admission: EM | Admit: 2017-03-28 | Discharge: 2017-03-28 | Disposition: A | Discharge status: Home / Self Care | Payer: Self-pay | Attending: Emergency Medicine | Admitting: Emergency Medicine

## 2017-03-28 ENCOUNTER — Encounter (HOSPITAL_COMMUNITY): Payer: Self-pay | Admitting: Emergency Medicine

## 2017-03-28 DIAGNOSIS — R300 Dysuria: Secondary | ICD-10-CM | POA: Insufficient documentation

## 2017-03-28 DIAGNOSIS — Z87891 Personal history of nicotine dependence: Secondary | ICD-10-CM | POA: Insufficient documentation

## 2017-03-28 DIAGNOSIS — N730 Acute parametritis and pelvic cellulitis: Secondary | ICD-10-CM

## 2017-03-28 DIAGNOSIS — R319 Hematuria, unspecified: Secondary | ICD-10-CM | POA: Insufficient documentation

## 2017-03-28 DIAGNOSIS — N739 Female pelvic inflammatory disease, unspecified: Secondary | ICD-10-CM | POA: Insufficient documentation

## 2017-03-28 LAB — COMPREHENSIVE METABOLIC PANEL
ALK PHOS: 56 U/L (ref 38–126)
ALT: 11 U/L — AB (ref 14–54)
AST: 13 U/L — ABNORMAL LOW (ref 15–41)
Albumin: 4.1 g/dL (ref 3.5–5.0)
Anion gap: 10 (ref 5–15)
BUN: 11 mg/dL (ref 6–20)
CALCIUM: 9.2 mg/dL (ref 8.9–10.3)
CO2: 23 mmol/L (ref 22–32)
CREATININE: 0.73 mg/dL (ref 0.44–1.00)
Chloride: 105 mmol/L (ref 101–111)
GFR calc non Af Amer: >60 mL/min (ref >60)
Glucose, Bld: 89 mg/dL (ref 65–99)
Potassium: 3.8 mmol/L (ref 3.5–5.1)
SODIUM: 138 mmol/L (ref 135–145)
Total Bilirubin: 1 mg/dL (ref 0.3–1.2)
Total Protein: 7.7 g/dL (ref 6.5–8.1)

## 2017-03-28 LAB — WET PREP, GENITAL
CLUE CELLS WET PREP: NONE SEEN
SPERM: NONE SEEN
TRICH WET PREP: NONE SEEN
YEAST WET PREP: NONE SEEN

## 2017-03-28 LAB — LIPASE, BLOOD: Lipase: 18 U/L (ref 11–51)

## 2017-03-28 LAB — URINALYSIS, ROUTINE W REFLEX MICROSCOPIC
Bilirubin Urine: NEGATIVE
GLUCOSE, UA: NEGATIVE mg/dL
Ketones, ur: 80 mg/dL — AB
Nitrite: NEGATIVE
PH: 5 (ref 5.0–8.0)
Protein, ur: 30 mg/dL — AB
SPECIFIC GRAVITY, URINE: 1.014 (ref 1.005–1.030)

## 2017-03-28 LAB — CBC
HCT: 35 % — ABNORMAL LOW (ref 36.0–46.0)
Hemoglobin: 12 g/dL (ref 12.0–15.0)
MCH: 32.3 pg (ref 26.0–34.0)
MCHC: 34.3 g/dL (ref 30.0–36.0)
MCV: 94.1 fL (ref 78.0–100.0)
PLATELETS: 223 10*3/uL (ref 150–400)
RBC: 3.72 MIL/uL — AB (ref 3.87–5.11)
RDW: 11.9 % (ref 11.5–15.5)
WBC: 10.4 10*3/uL (ref 4.0–10.5)

## 2017-03-28 LAB — POC URINE PREG, ED: Preg Test, Ur: NEGATIVE

## 2017-03-28 LAB — I-STAT CG4 LACTIC ACID, ED: Lactic Acid, Venous: 0.54 mmol/L (ref 0.5–1.9)

## 2017-03-28 MED ORDER — ACETAMINOPHEN ER 650 MG PO TBCR: 650.0000 mg | EXTENDED_RELEASE_TABLET | Freq: Three times a day (TID) | ORAL | 0 refills | Status: DC | PRN

## 2017-03-28 MED ORDER — DOXYCYCLINE HYCLATE 100 MG PO CAPS: 100.0000 mg | ORAL_CAPSULE | Freq: Two times a day (BID) | ORAL | 0 refills | Status: DC

## 2017-03-28 MED ORDER — STERILE WATER FOR INJECTION IJ SOLN
INTRAMUSCULAR | Status: AC
  Administered 2017-03-28: 10 mL
  Filled 2017-03-28: qty 10

## 2017-03-28 MED ORDER — ACETAMINOPHEN 325 MG PO TABS
650.0000 mg | ORAL_TABLET | Freq: Once | ORAL | Status: AC | PRN
  Administered 2017-03-28: 650 mg via ORAL
  Filled 2017-03-28: qty 2

## 2017-03-28 MED ORDER — AZITHROMYCIN 250 MG PO TABS
1000.0000 mg | ORAL_TABLET | Freq: Once | ORAL | Status: AC
  Administered 2017-03-28: 1000 mg via ORAL
  Filled 2017-03-28: qty 4

## 2017-03-28 MED ORDER — CEFTRIAXONE SODIUM 250 MG IJ SOLR
250.0000 mg | Freq: Once | INTRAMUSCULAR | Status: AC
  Administered 2017-03-28: 250 mg via INTRAMUSCULAR
  Filled 2017-03-28: qty 250

## 2017-03-28 MED ORDER — METRONIDAZOLE 500 MG PO TABS: 500.0000 mg | ORAL_TABLET | Freq: Two times a day (BID) | ORAL | 0 refills | Status: DC

## 2017-03-28 MED ORDER — NAPROXEN 500 MG PO TABS: 500.0000 mg | ORAL_TABLET | Freq: Two times a day (BID) | ORAL | 0 refills | Status: DC

## 2017-03-28 NOTE — ED Triage Notes (Signed)
Per EMS, patient from home, c/o right flank and RLQ pain x2 days. Denies N/V/D. Reports dysuria with blood in urine. Hx UTI. Denies SOB and chest pain.   BP 106/73 HR 110

## 2017-03-28 NOTE — Discharge Instructions (Signed)
We are concerned that you have pelvic inflammatory disease.   PLEASE PRACTICE SAFE SEX. PLEASE INFORM YOUR PARTNER TO GET CHECKED AND TREATED FOR STD AS WELL.   Please take the medicine prescribed and complete the course of antibiotics. Please return to the ER if your symptoms worsen; you have increased pain, fevers, chills, inability to keep any medications down, confusion. Otherwise see your own doctor in 1 week.

## 2017-03-28 NOTE — ED Provider Notes (Signed)
[facility]-EMERGENCY DEPT Provider Note   CSN: [id number] Arrival date & time: 03/28/17  1614     History   Chief Complaint Chief Complaint  Patient presents with  . Flank Pain  . Abdominal Pain    HPI Shari Davidson is a 26 y.o. female.  HPI Pt comes in with cc of pelvic pain. Pt has hx of STDs. She reports that she has been having R sided pain x 2 days and pain in the lower quadrant. Pain is constant. There is no associated n/v/d. Pt does have vaginal discharge and she has dysuria. Pt denies any cough, chest pain, dib. She also denies any hx of renal stones. Pt's LMP was 2 weeks ago.   Past Medical History:  Diagnosis Date  . Asthma   . Chlamydia 08/06/11   treated at Urgent Care  . Gonorrhea 04/20/12   treated at Urgent Care  . Migraine   . Trichomonas contact, treated     Patient Active Problem List   Diagnosis Date Noted  . Depression, major, recurrent, moderate (HCC)   . Vaginal yeast infection 04/18/2013  . Difficulty urinating 04/18/2013  . Dysuria 08/09/2012  . UTI (lower urinary tract infection) 10/22/2011  . Possible exposure to STD 08/27/2011  . Dysfunctional uterine bleeding 08/27/2011  . Contraception management 06/03/2011  . Vaginal bleeding 05/05/2011  . Lightheadedness 03/19/2011  . CONSTIPATION 07/28/2010  . ACNE, MILD 07/28/2010  . MYOPIA 07/08/2007  . MIGRAINE, UNSPEC., W/O INTRACTABLE MIGRAINE 08/26/2006  . RHINITIS, ALLERGIC 08/26/2006  . ASTHMA, INTERMITTENT 08/26/2006    Past Surgical History:  Procedure Laterality Date  . NO PAST SURGERIES      OB History    Gravida Para Term Preterm AB Living   1 0     1 0   SAB TAB Ectopic Multiple Live Births   1               Home Medications    Prior to Admission medications   Medication Sig Start Date End Date Taking? Authorizing Provider  acetaminophen (TYLENOL 8 HOUR) 650 MG CR tablet Take 1 tablet (650 mg total) by mouth every 8 (eight) hours as needed for pain. 03/28/17    Derwood Kaplan, MD  albuterol (PROVENTIL HFA;VENTOLIN HFA) 108 (90 Base) MCG/ACT inhaler Inhale 2 puffs into the lungs every 4 (four) hours as needed for wheezing or shortness of breath. 02/10/17   Muthersbaugh, Dahlia Client, PA-C  doxycycline (VIBRAMYCIN) 100 MG capsule Take 1 capsule (100 mg total) by mouth 2 (two) times daily. 03/28/17   Derwood Kaplan, MD  metroNIDAZOLE (FLAGYL) 500 MG tablet Take 1 tablet (500 mg total) by mouth 2 (two) times daily. 03/28/17   Derwood Kaplan, MD  naproxen (NAPROSYN) 500 MG tablet Take 1 tablet (500 mg total) by mouth 2 (two) times daily with a meal. 03/28/17   Derwood Kaplan, MD  predniSONE (DELTASONE) 20 MG tablet Take 2 tablets (40 mg total) by mouth daily. 02/10/17   Muthersbaugh, Dahlia Client, PA-C    Family History Family History  Problem Relation Age of Onset  . Hypertension Other     Social History Social History  Substance Use Topics  . Smoking status: Former Smoker    Packs/day: 0.30    Types: Cigarettes    Quit date: 08/30/2014  . Smokeless tobacco: Never Used  . Alcohol use Yes     Comment: OCCASIONAL     Allergies   Patient has no known allergies.   Review of Systems Review of  Systems  Constitutional: Positive for fever.  Gastrointestinal: Positive for abdominal pain. Negative for nausea and vomiting.  Genitourinary: Positive for dysuria, flank pain, vaginal bleeding and vaginal discharge.  Allergic/Immunologic: Negative for immunocompromised state.  All other systems reviewed and are negative.    Physical Exam Updated Vital Signs BP (!) 125/57 (BP Location: Left Arm)   Pulse (!) 105   Temp 99.8 F (37.7 C)   Resp 20   Ht  (1.626 m)   Wt 53.1 kg (117 lb)   LMP 03/21/2017   SpO2 100%   BMI 20.08 kg/m   Physical Exam  Constitutional: She is oriented to person, place, and time. She appears well-developed.  HENT:  Head: Normocephalic and atraumatic.  Eyes: Pupils are equal, round, and reactive to light. Conjunctivae and  EOM are normal.  Neck: Normal range of motion. Neck supple.  Cardiovascular: Normal rate, regular rhythm, normal heart sounds and intact distal pulses.   No murmur heard. Pulmonary/Chest: Effort normal. No respiratory distress. She has no wheezes.  Abdominal: Soft. Bowel sounds are normal. She exhibits no distension. There is tenderness. There is no rebound and no guarding.  Generalized tenderness over the lower quadrants. Neg McBurneys.  Genitourinary: Vagina normal and uterus normal.  Genitourinary Comments: External exam - normal, no lesions Speculum exam: Pt has foul smelling discharge, + blood Bimanual exam: Patient has CMT, no adnexal tenderness or fullness and cervical os is closed  Neurological: She is alert and oriented to person, place, and time.  Skin: Skin is warm and dry.  Nursing note and vitals reviewed.    ED Treatments / Results  Labs (all labs ordered are listed, but only abnormal results are displayed) Labs Reviewed  WET PREP, GENITAL - Abnormal; Notable for the following:       Result Value   WBC, Wet Prep HPF POC FEW (*)    All other components within normal limits  COMPREHENSIVE METABOLIC PANEL - Abnormal; Notable for the following:    AST 13 (*)    ALT 11 (*)    All other components within normal limits  CBC - Abnormal; Notable for the following:    RBC 3.72 (*)    HCT 35.0 (*)    All other components within normal limits  URINALYSIS, ROUTINE W REFLEX MICROSCOPIC - Abnormal; Notable for the following:    APPearance HAZY (*)    Hgb urine dipstick LARGE (*)    Ketones, ur 80 (*)    Protein, ur 30 (*)    Leukocytes, UA SMALL (*)    Bacteria, UA RARE (*)    Squamous Epithelial / LPF 0-5 (*)    All other components within normal limits  LIPASE, BLOOD  POC URINE PREG, ED  I-STAT CG4 LACTIC ACID, ED  GC/CHLAMYDIA PROBE AMP ([place]) NOT AT [facility]    EKG  EKG Interpretation None       Radiology No results found.  Procedures Procedures  (including critical care time)  Medications Ordered in ED Medications  acetaminophen (TYLENOL) tablet 650 mg (650 mg Oral Given 03/28/17 1630)  cefTRIAXone (ROCEPHIN) injection 250 mg (250 mg Intramuscular Given 03/28/17 2202)  azithromycin (ZITHROMAX) tablet 1,000 mg (1,000 mg Oral Given 03/28/17 2201)  sterile water (preservative free) injection (10 mLs  Given 03/28/17 2202)     Initial Impression / Assessment and Plan / ED Course  I have reviewed the triage vital signs and the nursing notes.  Pertinent labs & imaging results that were available during  my care of the patient were reviewed by me and considered in my medical decision making (see chart for details).     Pt comes in with lower abd pain / pelvic pain. Tenderness is generalized in the lower quadrants, and pelvic exam reveals cervical motion tenderness. Pt is having unprotected sex with 1 partner. She has hx of STDs. We will tx her as PID.   Final Clinical Impressions(s) / ED Diagnoses   Final diagnoses:  Acute pelvic inflammatory disease (PID)    New Prescriptions Discharge Medication List as of 03/28/2017  9:58 PM    START taking these medications   Details  acetaminophen (TYLENOL 8 HOUR) 650 MG CR tablet Take 1 tablet (650 mg total) by mouth every 8 (eight) hours as needed for pain., Starting Sun 03/28/2017, Print    doxycycline (VIBRAMYCIN) 100 MG capsule Take 1 capsule (100 mg total) by mouth 2 (two) times daily., Starting Sun 03/28/2017, Print    metroNIDAZOLE (FLAGYL) 500 MG tablet Take 1 tablet (500 mg total) by mouth 2 (two) times daily., Starting Sun 03/28/2017, Print    naproxen (NAPROSYN) 500 MG tablet Take 1 tablet (500 mg total) by mouth 2 (two) times daily with a meal., Starting Sun 03/28/2017, Print         Derwood Kaplan, MD 03/28/17 [phone]

## 2017-03-28 NOTE — ED Notes (Signed)
Called patient to room x 3 and no answer.

## 2017-03-30 LAB — GC/CHLAMYDIA PROBE AMP (~~LOC~~) NOT AT ARMC
Chlamydia: NEGATIVE
NEISSERIA GONORRHEA: POSITIVE — AB

## 2017-08-21 ENCOUNTER — Ambulatory Visit (HOSPITAL_COMMUNITY)
Admission: EM | Admit: 2017-08-21 | Discharge: 2017-08-21 | Disposition: A | Discharge status: Home / Self Care | Payer: Self-pay | Attending: Family Medicine | Admitting: Family Medicine

## 2017-08-21 ENCOUNTER — Other Ambulatory Visit: Payer: Self-pay

## 2017-08-21 ENCOUNTER — Encounter (HOSPITAL_COMMUNITY): Payer: Self-pay | Admitting: *Deleted

## 2017-08-21 DIAGNOSIS — L249 Irritant contact dermatitis, unspecified cause: Secondary | ICD-10-CM

## 2017-08-21 DIAGNOSIS — R21 Rash and other nonspecific skin eruption: Secondary | ICD-10-CM

## 2017-08-21 DIAGNOSIS — Z3202 Encounter for pregnancy test, result negative: Secondary | ICD-10-CM

## 2017-08-21 LAB — POCT PREGNANCY, URINE: PREG TEST UR: NEGATIVE

## 2017-08-21 MED ORDER — PREDNISONE 20 MG PO TABS: 40.0000 mg | ORAL_TABLET | Freq: Every day | ORAL | 0 refills | Status: AC

## 2017-08-21 MED ORDER — CETIRIZINE HCL 10 MG PO TABS: 10.0000 mg | ORAL_TABLET | Freq: Every day | ORAL | 0 refills | Status: DC

## 2017-08-21 NOTE — ED Triage Notes (Addendum)
Reports "breaking out" on face, neck, and torso over past 2 days; rash is sporadic, "burning", and pruritic.  No urticaria noted. Denies any unusual throat or oral sensations.  Reports using a new cosmetic face gel.  Also c/o nausea.

## 2017-08-21 NOTE — Discharge Instructions (Signed)
Vaseline over burning areas.

## 2017-08-21 NOTE — ED Provider Notes (Signed)
  [facility]    CSN: [id number] Arrival date & time: 08/21/17  1825  Chief Complaint  Patient presents with  . Rash    Shari Davidson is a 27 y.o. female here for a skin complaint.  Duration: 2 days Location: face, upper neck/chest Pruritic? Yes Painful? Yes- burning Drainage? No New soaps/lotions/topicals/detergents? Yes Sick contacts? No Other associated symptoms: none Therapies tried thus far: topical steroid-burned  ROS:  Const: No fevers Skin: As noted in HPI  Past Medical History:  Diagnosis Date  . Asthma   . Chlamydia 08/06/11   treated at Urgent Care  . Gonorrhea 04/20/12   treated at Urgent Care  . Migraine   . Trichomonas contact, treated    No Known Allergies Allergies as of 08/21/2017   No Known Allergies      BP 130/75   Pulse 85   Temp 98.6 F (37 C) (Oral)   Resp 16   LMP 06/27/2017 (Approximate)   SpO2 99%  Gen: awake, alert, appearing stated age Lungs: No accessory muscle use Skin: erythematous papules on face, pinkish-red patch on chest, excoriated area on neck with pink-red basee. No drainage, TTP, fluctuance Psych: Age appropriate judgment and insight  Irritant contact dermatitis, unspecified trigger  Prednisone 40 mg/d, 5 d Zyrtec 10 mg/d, 14 days. Vaseline.  F/u prn. The patient voiced understanding and agreement to the plan.    Sharlene DoryWendling, Emerick Weatherly Paul, [place] 08/21/17 2031

## 2017-08-21 NOTE — ED Notes (Signed)
Cool washcloth provided.

## 2017-11-27 ENCOUNTER — Encounter (HOSPITAL_COMMUNITY): Payer: Self-pay

## 2017-11-27 ENCOUNTER — Other Ambulatory Visit: Payer: Self-pay

## 2017-11-27 ENCOUNTER — Emergency Department (HOSPITAL_COMMUNITY)
Admission: EM | Admit: 2017-11-27 | Discharge: 2017-11-27 | Disposition: A | Discharge status: Home / Self Care | Payer: No Typology Code available for payment source | Attending: Emergency Medicine | Admitting: Emergency Medicine

## 2017-11-27 ENCOUNTER — Emergency Department (HOSPITAL_COMMUNITY): Payer: No Typology Code available for payment source

## 2017-11-27 DIAGNOSIS — Y92410 Unspecified street and highway as the place of occurrence of the external cause: Secondary | ICD-10-CM | POA: Diagnosis not present

## 2017-11-27 DIAGNOSIS — J45909 Unspecified asthma, uncomplicated: Secondary | ICD-10-CM | POA: Diagnosis not present

## 2017-11-27 DIAGNOSIS — R0789 Other chest pain: Secondary | ICD-10-CM | POA: Insufficient documentation

## 2017-11-27 DIAGNOSIS — Y999 Unspecified external cause status: Secondary | ICD-10-CM | POA: Diagnosis not present

## 2017-11-27 DIAGNOSIS — M542 Cervicalgia: Secondary | ICD-10-CM | POA: Insufficient documentation

## 2017-11-27 DIAGNOSIS — Y9389 Activity, other specified: Secondary | ICD-10-CM | POA: Diagnosis not present

## 2017-11-27 DIAGNOSIS — Z87891 Personal history of nicotine dependence: Secondary | ICD-10-CM | POA: Insufficient documentation

## 2017-11-27 DIAGNOSIS — R55 Syncope and collapse: Secondary | ICD-10-CM | POA: Insufficient documentation

## 2017-11-27 LAB — CBG MONITORING, ED: Glucose-Capillary: 93 mg/dL (ref 65–99)

## 2017-11-27 MED ORDER — KETOROLAC TROMETHAMINE 30 MG/ML IJ SOLN
15.0000 mg | Freq: Once | INTRAMUSCULAR | Status: AC
  Administered 2017-11-27: 15 mg via INTRAMUSCULAR
  Filled 2017-11-27: qty 1
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
## 2017-11-27 NOTE — ED Notes (Signed)
Patient transported to X-ray 
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
## 2018-03-28 ENCOUNTER — Telehealth: Payer: Self-pay | Admitting: Family Medicine

## 2018-03-28 NOTE — Telephone Encounter (Signed)
Called pt regarding to health maintenance appt, but the phone number was disconnected.

## 2018-04-21 ENCOUNTER — Encounter (HOSPITAL_COMMUNITY): Payer: Self-pay

## 2018-04-21 ENCOUNTER — Ambulatory Visit (HOSPITAL_COMMUNITY)
Admission: EM | Admit: 2018-04-21 | Discharge: 2018-04-21 | Disposition: A | Discharge status: Home / Self Care | Payer: Self-pay | Attending: Family Medicine | Admitting: Family Medicine

## 2018-04-21 DIAGNOSIS — G43909 Migraine, unspecified, not intractable, without status migrainosus: Secondary | ICD-10-CM | POA: Insufficient documentation

## 2018-04-21 DIAGNOSIS — F329 Major depressive disorder, single episode, unspecified: Secondary | ICD-10-CM | POA: Insufficient documentation

## 2018-04-21 DIAGNOSIS — Z3202 Encounter for pregnancy test, result negative: Secondary | ICD-10-CM

## 2018-04-21 DIAGNOSIS — N390 Urinary tract infection, site not specified: Secondary | ICD-10-CM | POA: Insufficient documentation

## 2018-04-21 DIAGNOSIS — Z87891 Personal history of nicotine dependence: Secondary | ICD-10-CM | POA: Insufficient documentation

## 2018-04-21 DIAGNOSIS — J4520 Mild intermittent asthma, uncomplicated: Secondary | ICD-10-CM | POA: Insufficient documentation

## 2018-04-21 LAB — POCT URINALYSIS DIP (DEVICE)
BILIRUBIN URINE: NEGATIVE
Glucose, UA: NEGATIVE mg/dL
KETONES UR: 15 mg/dL — AB
NITRITE: POSITIVE — AB
PH: 5.5 (ref 5.0–8.0)
PROTEIN: NEGATIVE mg/dL
Specific Gravity, Urine: 1.025 (ref 1.005–1.030)
Urobilinogen, UA: 0.2 mg/dL (ref 0.0–1.0)

## 2018-04-21 LAB — POCT PREGNANCY, URINE: Preg Test, Ur: NEGATIVE

## 2018-04-21 MED ORDER — NITROFURANTOIN MONOHYD MACRO 100 MG PO CAPS: 100.0000 mg | ORAL_CAPSULE | Freq: Two times a day (BID) | ORAL | 0 refills | Status: AC

## 2018-04-21 MED ORDER — PHENAZOPYRIDINE HCL 200 MG PO TABS: 200.0000 mg | ORAL_TABLET | Freq: Three times a day (TID) | ORAL | 0 refills | Status: DC

## 2018-04-21 NOTE — ED Provider Notes (Signed)
[facility]    CSN: [id number] Arrival date & time: 04/21/18  1328     History   Chief Complaint Chief Complaint  Patient presents with  . Urinary Tract Infection    HPI Shari Davidson is a 27 y.o. female.   Shari presents with complaints of burning with urination and odor with urination which started approximately 1 week ago. No frequency. No fever. Has had some pelvic cramping but otherwise no pain. No back pain. No blood to urine. States has had some pain with intercourse and maybe small amount of vaginal discharge, she is concerned may be BV which she has had in the past. No vaginal bleeding. Hasn't taken any medications for symptoms. LMP 10/6. Hx of asthma, chlamydia, gonorrhea, migraine, trichomonas, uti's.    ROS per HPI.      Past Medical History:  Diagnosis Date  . Asthma   . Chlamydia 08/06/11   treated at Urgent Care  . Gonorrhea 04/20/12   treated at Urgent Care  . Migraine   . Trichomonas contact, treated     Patient Active Problem List   Diagnosis Date Noted  . Depression, major, recurrent, moderate (HCC)   . Vaginal yeast infection 04/18/2013  . Difficulty urinating 04/18/2013  . Dysuria 08/09/2012  . UTI (lower urinary tract infection) 10/22/2011  . Possible exposure to STD 08/27/2011  . Dysfunctional uterine bleeding 08/27/2011  . Contraception management 06/03/2011  . Vaginal bleeding 05/05/2011  . Lightheadedness 03/19/2011  . CONSTIPATION 07/28/2010  . ACNE, MILD 07/28/2010  . MYOPIA 07/08/2007  . MIGRAINE, UNSPEC., W/O INTRACTABLE MIGRAINE 08/26/2006  . RHINITIS, ALLERGIC 08/26/2006  . ASTHMA, INTERMITTENT 08/26/2006    Past Surgical History:  Procedure Laterality Date  . NO PAST SURGERIES      OB History    Gravida  1   Para  0   Term      Preterm      AB  1   Living  0     SAB  1   TAB      Ectopic      Multiple      Live Births               Home Medications    Prior to  Admission medications   Medication Sig Start Date End Date Taking? Authorizing Provider  acetaminophen (TYLENOL 8 HOUR) 650 MG CR tablet Take 1 tablet (650 mg total) by mouth every 8 (eight) hours as needed for pain. Patient not taking: Reported on 11/27/2017 03/28/17   Derwood Kaplan, MD  Aspirin-Salicylamide-Caffeine (BC HEADACHE POWDER PO) Take 1 packet by mouth daily as needed (pain).    [provider]  cetirizine (ZYRTEC ALLERGY) 10 MG tablet Take 1 tablet (10 mg total) by mouth daily. Patient not taking: Reported on 11/27/2017 08/21/17   Sharlene Dory, DO  nitrofurantoin, macrocrystal-monohydrate, (MACROBID) 100 MG capsule Take 1 capsule (100 mg total) by mouth 2 (two) times daily for 5 days. 04/21/18 04/26/18  Georgetta Haber, NP  phenazopyridine (PYRIDIUM) 200 MG tablet Take 1 tablet (200 mg total) by mouth 3 (three) times daily. 04/21/18   Georgetta Haber, NP    Family History Family History  Problem Relation Age of Onset  . Hypertension Other     Social History Social History   Tobacco Use  . Smoking status: Former Smoker    Packs/day: 0.30    Types: Cigarettes    Last attempt to quit: 08/30/2014  Years since quitting: 3.6  . Smokeless tobacco: Never Used  Substance Use Topics  . Alcohol use: Yes    Comment: OCCASIONAL  . Drug use: No     Allergies   Patient has no known allergies.   Review of Systems Review of Systems   Physical Exam Triage Vital Signs ED Triage Vitals  Enc Vitals Group     BP 04/21/18 1349 123/69     Pulse Rate 04/21/18 1349 77     Resp 04/21/18 1349 16     Temp 04/21/18 1349 98.8 F (37.1 C)     Temp Source 04/21/18 1349 Oral     SpO2 04/21/18 1349 100 %     Weight --      Height --      Head Circumference --      Peak Flow --      Pain Score 04/21/18 1350 8     Pain Loc --      Pain Edu? --      Excl. in GC? --    No data found.  Updated Vital Signs BP 123/69 (BP Location: Left Arm)   Pulse 77   Temp  98.8 F (37.1 C) (Oral)   Resp 16   LMP 04/03/2018   SpO2 100%    Physical Exam  Constitutional: She is oriented to person, place, and time. She appears well-developed and well-nourished. No distress.  Cardiovascular: Normal rate, regular rhythm and normal heart sounds.  Pulmonary/Chest: Effort normal and breath sounds normal.  Abdominal: Soft. There is no tenderness. There is no rigidity, no rebound, no guarding and no CVA tenderness.  Genitourinary:  Genitourinary Comments: Denies sores, lesions, vaginal bleeding; no pelvic pain; gu exam deferred at this time, vaginal self swab collected.    Neurological: She is alert and oriented to person, place, and time.  Skin: Skin is warm and dry.     UC Treatments / Results  Labs (all labs ordered are listed, but only abnormal results are displayed) Labs Reviewed  POCT URINALYSIS DIP (DEVICE) - Abnormal; Notable for the following components:      Result Value   Ketones, ur 15 (*)    Hgb urine dipstick TRACE (*)    Nitrite POSITIVE (*)    Leukocytes, UA TRACE (*)    All other components within normal limits  URINE CULTURE  CERVICOVAGINAL ANCILLARY ONLY    EKG None  Radiology No results found.  Procedures Procedures (including critical care time)  Medications Ordered in UC Medications - No data to display  Initial Impression / Assessment and Plan / UC Course  I have reviewed the triage vital signs and the nursing notes.  Pertinent labs & imaging results that were available during my care of the patient were reviewed by me and considered in my medical decision making (see chart for details).     Urine positive for nitrite and leukocytes with symptoms of UTI. Course of macrobid provided. Vaginal cytology pending. Will notify of any positive findings and if any changes to treatment are needed.  If symptoms worsen or do not improve in the next week to return to be seen or to follow up with PCP.  Patient verbalized understanding  and agreeable to plan.   Final Clinical Impressions(s) / UC Diagnoses   Final diagnoses:  Lower urinary tract infectious disease     Discharge Instructions     Complete course of antibiotics.  May use pyridium as needed for burning, may turn your urine  orange.  Will notify you of any positive findings from your vaginal swab and if any changes to treatment are needed.   If symptoms worsen or do not improve in the next week to return to be seen or to follow up with you PCP.      ED Prescriptions    Medication Sig Dispense Auth. Provider   nitrofurantoin, macrocrystal-monohydrate, (MACROBID) 100 MG capsule Take 1 capsule (100 mg total) by mouth 2 (two) times daily for 5 days. 10 capsule Linus Mako B, NP   phenazopyridine (PYRIDIUM) 200 MG tablet Take 1 tablet (200 mg total) by mouth 3 (three) times daily. 6 tablet Georgetta Haber, NP     Controlled Substance Prescriptions [place] Controlled Substance Registry consulted? Not Applicable   Georgetta Haber, NP 04/21/18 1420

## 2018-04-21 NOTE — Discharge Instructions (Signed)
Complete course of antibiotics.  May use pyridium as needed for burning, may turn your urine orange.  Will notify you of any positive findings from your vaginal swab and if any changes to treatment are needed.   If symptoms worsen or do not improve in the next week to return to be seen or to follow up with you PCP.

## 2018-04-21 NOTE — ED Triage Notes (Signed)
Pt presents with urinary tract symptoms; discomfort, cloudy urine, and frequent urination.

## 2018-04-22 LAB — CERVICOVAGINAL ANCILLARY ONLY
Bacterial vaginitis: POSITIVE — AB
CANDIDA VAGINITIS: NEGATIVE
Chlamydia: NEGATIVE
Neisseria Gonorrhea: POSITIVE — AB
Trichomonas: POSITIVE — AB

## 2018-04-23 LAB — URINE CULTURE: Culture: >=100000 — AB

## 2018-04-25 ENCOUNTER — Telehealth (HOSPITAL_COMMUNITY): Payer: Self-pay

## 2018-04-25 MED ORDER — METRONIDAZOLE 500 MG PO TABS: 500.0000 mg | ORAL_TABLET | Freq: Two times a day (BID) | ORAL | 0 refills | Status: DC

## 2018-04-25 NOTE — Telephone Encounter (Signed)
Bacterial vaginosis is positive. This was not treated at the urgent care visit.  Flagyl 500 mg BID x 7 days #14 no refills sent to patients pharmacy of choice.    Gonorrhea is positive.  Patient should return as soon as possible to the urgent care for treatment with IM rocephin 250mg  and po zithromax 1g. Patient will not need to see a provider unless there are new symptoms she would like evaluated. Pt needs education to refrain from sexual intercourse for now and for 7 days after treatment to give the medicine time to work.  Sexual partners need to be notified and tested/treated.  Condoms may reduce risk of reinfection. [facility] notified.   Trichomonas is positive. This will be treated with above Flagyl for BV.  Pt needs education to refrain from sexual intercourse for 7 days to give the medicine time to work.  Sexual partners need to be notified and tested/treated.  Condoms may reduce risk of reinfection.  Recheck for further evaluation if symptoms are not improving.    Number provided is not working. Letter sent to patient requesting a call back.

## 2018-04-25 NOTE — Telephone Encounter (Signed)
Urine culture positive for E.coli. This was treated with Macrobid at [facility] visit. Attempted to reach patient. No answer at this time. Message sent to mychart.

## 2018-04-25 NOTE — Telephone Encounter (Signed)
Bacterial vaginosis is positive. This was not treated at the urgent care visit.  Flagyl 500 mg BID x 7 days #14 no refills sent to patients pharmacy of choice.    Gonorrhea is positive.  Patient should return as soon as possible to the urgent care for treatment with IM rocephin 250mg  and po zithromax 1g. Patient will not need to see a provider unless there are new symptoms she would like evaluated. Pt needs education to refrain from sexual intercourse for now and for 7 days after treatment to give the medicine time to work.  Sexual partners need to be notified and tested/treated.  Condoms may reduce risk of reinfection. [facility] notified.   Trichomonas is positive. This will be treated with above Flagyl for BV. Pt needs education to refrain from sexual intercourse for 7 days to give the medicine time to work.  Sexual partners need to be notified and tested/treated.  Condoms may reduce risk of reinfection.  Recheck for further evaluation if symptoms are not improving.   Number provided is not correct, letter sent to patient.

## 2018-05-02 ENCOUNTER — Ambulatory Visit (HOSPITAL_COMMUNITY)
Admission: EM | Admit: 2018-05-02 | Discharge: 2018-05-02 | Disposition: A | Discharge status: Home / Self Care | Payer: Self-pay | Attending: Family Medicine | Admitting: Family Medicine

## 2018-05-02 DIAGNOSIS — A549 Gonococcal infection, unspecified: Secondary | ICD-10-CM

## 2018-05-02 MED ORDER — AZITHROMYCIN 250 MG PO TABS
ORAL_TABLET | ORAL | Status: AC
  Filled 2018-05-02: qty 4

## 2018-05-02 MED ORDER — ONDANSETRON HCL 4 MG PO TABS: 4.0000 mg | ORAL_TABLET | Freq: Four times a day (QID) | ORAL | 0 refills | Status: DC

## 2018-05-02 MED ORDER — AZITHROMYCIN 250 MG PO TABS
1000.0000 mg | ORAL_TABLET | Freq: Once | ORAL | Status: AC
  Administered 2018-05-02: 1000 mg via ORAL

## 2018-05-02 MED ORDER — CEFTRIAXONE SODIUM 250 MG IJ SOLR
250.0000 mg | Freq: Once | INTRAMUSCULAR | Status: AC
  Administered 2018-05-02: 250 mg via INTRAMUSCULAR

## 2018-05-02 MED ORDER — CEFTRIAXONE SODIUM 250 MG IJ SOLR
INTRAMUSCULAR | Status: AC
  Filled 2018-05-02: qty 250

## 2018-05-02 NOTE — ED Notes (Signed)
Bed: UC01 Expected date:  Expected time:  Means of arrival:  Comments: 

## 2018-05-02 NOTE — ED Notes (Signed)
Pt here for treatment for gonorrhea. Pt states the flagyl makes her sick so dr. Delton See sent so zofran off for her. Pt has no other complaints.

## 2018-11-23 ENCOUNTER — Other Ambulatory Visit: Payer: Self-pay

## 2018-11-23 ENCOUNTER — Encounter (HOSPITAL_COMMUNITY): Payer: Self-pay

## 2018-11-23 ENCOUNTER — Ambulatory Visit (HOSPITAL_COMMUNITY)
Admission: EM | Admit: 2018-11-23 | Discharge: 2018-11-23 | Disposition: A | Discharge status: Home / Self Care | Payer: Self-pay | Attending: Internal Medicine | Admitting: Internal Medicine

## 2018-11-23 DIAGNOSIS — L0103 Bullous impetigo: Secondary | ICD-10-CM

## 2018-11-23 MED ORDER — CLOTRIMAZOLE 1 % EX CREA: TOPICAL_CREAM | CUTANEOUS | 0 refills | Status: DC

## 2018-11-23 MED ORDER — DOXYCYCLINE HYCLATE 100 MG PO CAPS: 100.0000 mg | ORAL_CAPSULE | Freq: Two times a day (BID) | ORAL | 0 refills | Status: AC

## 2018-11-23 NOTE — ED Provider Notes (Signed)
[facility]    CSN: [id number] Arrival date & time: 11/23/18  [id number]     History   Chief Complaint Chief Complaint  Patient presents with  . Rash    HPI Shari Davidson is a 28 y.o. female with history of asthma comes to urgent care with complaints of rash on both thighs.  Symptoms started about 5 days ago and is been getting progressively worse.  Rash is described as pruritic with a burning sensation.  Patient denies any fever.  She has applied several agents including Lysol to the areas with no improvement.  She comes to urgent care to be evaluated for that.  No insect bites.  No relieving factors.  No muscle or body aches.Some of the lesions discharge clear fluids. HPI  Past Medical History:  Diagnosis Date  . Asthma   . Chlamydia 08/06/11   treated at Urgent Care  . Gonorrhea 04/20/12   treated at Urgent Care  . Migraine   . Trichomonas contact, treated     Patient Active Problem List   Diagnosis Date Noted  . Depression, major, recurrent, moderate (HCC)   . Vaginal yeast infection 04/18/2013  . Difficulty urinating 04/18/2013  . Dysuria 08/09/2012  . UTI (lower urinary tract infection) 10/22/2011  . Possible exposure to STD 08/27/2011  . Dysfunctional uterine bleeding 08/27/2011  . Contraception management 06/03/2011  . Vaginal bleeding 05/05/2011  . Lightheadedness 03/19/2011  . CONSTIPATION 07/28/2010  . ACNE, MILD 07/28/2010  . MYOPIA 07/08/2007  . MIGRAINE, UNSPEC., W/O INTRACTABLE MIGRAINE 08/26/2006  . RHINITIS, ALLERGIC 08/26/2006  . ASTHMA, INTERMITTENT 08/26/2006    Past Surgical History:  Procedure Laterality Date  . NO PAST SURGERIES      OB History    Gravida  1   Para  0   Term      Preterm      AB  1   Living  0     SAB  1   TAB      Ectopic      Multiple      Live Births               Home Medications    Prior to Admission medications   Medication Sig Start Date End Date Taking? Authorizing  Provider  acetaminophen (TYLENOL 8 HOUR) 650 MG CR tablet Take 1 tablet (650 mg total) by mouth every 8 (eight) hours as needed for pain. Patient not taking: Reported on 11/27/2017 03/28/17   Derwood KaplanNanavati, Ankit, MD  Aspirin-Salicylamide-Caffeine (BC HEADACHE POWDER PO) Take 1 packet by mouth daily as needed (pain).    [provider]  cetirizine (ZYRTEC ALLERGY) 10 MG tablet Take 1 tablet (10 mg total) by mouth daily. Patient not taking: Reported on 11/27/2017 08/21/17   Sharlene DoryWendling, Nicholas Paul, DO  clotrimazole (LOTRIMIN) 1 % cream Apply to affected area 2 times daily 11/23/18   Keatyn Luck, Britta MccreedyPhilip O, MD  doxycycline (VIBRAMYCIN) 100 MG capsule Take 1 capsule (100 mg total) by mouth 2 (two) times daily for 7 days. 11/23/18 11/30/18  Merrilee JanskyLamptey, Kierre Deines O, MD  metroNIDAZOLE (FLAGYL) 500 MG tablet Take 1 tablet (500 mg total) by mouth 2 (two) times daily. 04/25/18   Eustace MooreNelson, Yvonne Sue, MD  ondansetron (ZOFRAN) 4 MG tablet Take 1 tablet (4 mg total) by mouth every 6 (six) hours. 05/02/18   Eustace MooreNelson, Yvonne Sue, MD  phenazopyridine (PYRIDIUM) 200 MG tablet Take 1 tablet (200 mg total) by mouth 3 (three) times daily. 04/21/18  Georgetta Haber, NP    Family History Family History  Problem Relation Age of Onset  . Hypertension Other     Social History Social History   Tobacco Use  . Smoking status: Former Smoker    Packs/day: 0.30    Types: Cigarettes    Last attempt to quit: 08/30/2014    Years since quitting: 4.2  . Smokeless tobacco: Never Used  Substance Use Topics  . Alcohol use: Yes    Comment: OCCASIONAL  . Drug use: No     Allergies   Patient has no known allergies.   Review of Systems Review of Systems  Constitutional: Negative for activity change, appetite change, chills and fatigue.  HENT: Negative for mouth sores, sinus pressure, sinus pain and sore throat.   Eyes: Negative for pain and redness.  Respiratory: Negative for cough, chest tightness and shortness of breath.    Gastrointestinal: Negative for abdominal distention, abdominal pain, diarrhea and vomiting.  Genitourinary: Negative for dysuria, frequency and urgency.  Musculoskeletal: Negative for arthralgias and myalgias.  Skin: Positive for color change, rash and wound. Negative for pallor.  Neurological: Negative for dizziness, weakness and numbness.  Hematological: Negative.   All other systems reviewed and are negative.    Physical Exam Triage Vital Signs ED Triage Vitals  Enc Vitals Group     BP 11/23/18 0823 132/79     Pulse Rate 11/23/18 0823 (!) 104     Resp 11/23/18 0823 16     Temp 11/23/18 0823 98.8 F (37.1 C)     Temp Source 11/23/18 0823 Oral     SpO2 11/23/18 0823 100 %     Weight 11/23/18 0825 130 lb (59 kg)     Height --      Head Circumference --      Peak Flow --      Pain Score 11/23/18 0824 9     Pain Loc --      Pain Edu? --      Excl. in [place]? --    No data found.  Updated Vital Signs BP 132/79 (BP Location: Left Arm)   Pulse (!) 104 Comment: CMA Clement Husbands aware of HR  Temp 98.8 F (37.1 C) (Oral)   Resp 16   Wt 59 kg   SpO2 100%   BMI 22.31 kg/m   Visual Acuity Right Eye Distance:   Left Eye Distance:   Bilateral Distance:    Right Eye Near:   Left Eye Near:    Bilateral Near:     Physical Exam Vitals signs reviewed.  Constitutional:      Appearance: Normal appearance. She is ill-appearing.  Cardiovascular:     Rate and Rhythm: Normal rate and regular rhythm.     Pulses: Normal pulses.     Heart sounds: Normal heart sounds.  Pulmonary:     Effort: Pulmonary effort is normal.     Breath sounds: Normal breath sounds.  Abdominal:     General: Bowel sounds are normal.     Palpations: Abdomen is soft.  Musculoskeletal: Normal range of motion.  Skin:    General: Skin is warm.     Capillary Refill: Capillary refill takes less than 2 seconds.     Coloration: Skin is not jaundiced.     Findings: Rash present. No bruising.     Comments:  Multiple skin lesions confined to both thighs.  Some of the lesions have superficial ulceration with minimal surrounding erythema.  No clear discharge.  Neurological:     General: No focal deficit present.     Mental Status: She is alert.     Cranial Nerves: No cranial nerve deficit.     Motor: No weakness.      UC Treatments / Results  Labs (all labs ordered are listed, but only abnormal results are displayed) Labs Reviewed - No data to display  EKG None  Radiology No results found.  Procedures Procedures (including critical care time)  Medications Ordered in UC Medications - No data to display  Initial Impression / Assessment and Plan / UC Course  I have reviewed the triage vital signs and the nursing notes.  Pertinent labs & imaging results that were available during my care of the patient were reviewed by me and considered in my medical decision making (see chart for details).     1.  Impetigo: Suspected Staph Doxycycline 100 mg twice daily for 7 days Patient is advised to return to urgent care if surrounding erythema worsens. Final Clinical Impressions(s) / UC Diagnoses   Final diagnoses:  Impetigo bullosa   Discharge Instructions   None    ED Prescriptions    Medication Sig Dispense Auth. Provider   doxycycline (VIBRAMYCIN) 100 MG capsule Take 1 capsule (100 mg total) by mouth 2 (two) times daily for 7 days. 14 capsule Roen Macgowan, Britta Mccreedy, MD   clotrimazole (LOTRIMIN) 1 % cream Apply to affected area 2 times daily 15 g Crystle Carelli, Britta Mccreedy, MD     Controlled Substance Prescriptions [place] Controlled Substance Registry consulted? No   Merrilee Jansky, MD 11/23/18 1125

## 2018-11-23 NOTE — ED Triage Notes (Signed)
Pt states that she has a rash on both of her thighs. Pt thinks its a ringworm. Pt states it's been there about 5 days.

## 2018-12-06 ENCOUNTER — Ambulatory Visit (HOSPITAL_COMMUNITY)
Admission: EM | Admit: 2018-12-06 | Discharge: 2018-12-06 | Disposition: A | Discharge status: Home / Self Care | Payer: Self-pay | Attending: Family Medicine | Admitting: Family Medicine

## 2018-12-06 ENCOUNTER — Other Ambulatory Visit: Payer: Self-pay

## 2018-12-06 ENCOUNTER — Encounter (HOSPITAL_COMMUNITY): Payer: Self-pay

## 2018-12-06 DIAGNOSIS — B359 Dermatophytosis, unspecified: Secondary | ICD-10-CM

## 2018-12-06 MED ORDER — FLUCONAZOLE 200 MG PO TABS: ORAL_TABLET | ORAL | 0 refills | Status: DC

## 2018-12-06 NOTE — ED Provider Notes (Signed)
[facility]    CSN: [id number] Arrival date & time: 12/06/18  0801     History   Chief Complaint Chief Complaint  Patient presents with  . Rash    HPI Shari Davidson is a 28 y.o. female.   Patient is a 28 year old female who presents with worsening rash to bilateral upper thigh area.  She was seen here 11/23/2018 and treated for possible impetigo versus tinea infection.  She was treated with doxycycline and clotrimazole.  Reported that since her symptoms have worsened.  She is still having itching.  Denies any oozing or draining from the rash.  Denies any fevers, chills, body aches, joint pain, night sweats.  ROS per HPI      Past Medical History:  Diagnosis Date  . Asthma   . Chlamydia 08/06/11   treated at Urgent Care  . Gonorrhea 04/20/12   treated at Urgent Care  . Migraine   . Trichomonas contact, treated     Patient Active Problem List   Diagnosis Date Noted  . Depression, major, recurrent, moderate (HCC)   . Vaginal yeast infection 04/18/2013  . Difficulty urinating 04/18/2013  . Dysuria 08/09/2012  . UTI (lower urinary tract infection) 10/22/2011  . Possible exposure to STD 08/27/2011  . Dysfunctional uterine bleeding 08/27/2011  . Contraception management 06/03/2011  . Vaginal bleeding 05/05/2011  . Lightheadedness 03/19/2011  . CONSTIPATION 07/28/2010  . ACNE, MILD 07/28/2010  . MYOPIA 07/08/2007  . MIGRAINE, UNSPEC., W/O INTRACTABLE MIGRAINE 08/26/2006  . RHINITIS, ALLERGIC 08/26/2006  . ASTHMA, INTERMITTENT 08/26/2006    Past Surgical History:  Procedure Laterality Date  . NO PAST SURGERIES      OB History    Gravida  1   Para  0   Term      Preterm      AB  1   Living  0     SAB  1   TAB      Ectopic      Multiple      Live Births               Home Medications    Prior to Admission medications   Medication Sig Start Date End Date Taking? Authorizing Provider  acetaminophen (TYLENOL 8 HOUR)  650 MG CR tablet Take 1 tablet (650 mg total) by mouth every 8 (eight) hours as needed for pain. Patient not taking: Reported on 11/27/2017 03/28/17   Derwood KaplanNanavati, Ankit, MD  fluconazole (DIFLUCAN) 200 MG tablet Take 1 tab once a week for 4 weeks 12/06/18   Janace ArisBast, Braley Luckenbaugh A, NP    Family History Family History  Problem Relation Age of Onset  . Hypertension Other   . Healthy Mother   . Healthy Father     Social History Social History   Tobacco Use  . Smoking status: Former Smoker    Packs/day: 0.30    Types: Cigarettes    Last attempt to quit: 08/30/2014    Years since quitting: 4.2  . Smokeless tobacco: Never Used  Substance Use Topics  . Alcohol use: Yes    Comment: OCCASIONAL  . Drug use: No     Allergies   Patient has no known allergies.   Review of Systems Review of Systems   Physical Exam Triage Vital Signs ED Triage Vitals  Enc Vitals Group     BP 12/06/18 0815 118/68     Pulse Rate 12/06/18 0815 69     Resp 12/06/18 0815 17  Temp 12/06/18 0815 98.3 F (36.8 C)     Temp Source 12/06/18 0815 Oral     SpO2 12/06/18 0815 100 %     Weight --      Height --      Head Circumference --      Peak Flow --      Pain Score 12/06/18 0813 0     Pain Loc --      Pain Edu? --      Excl. in [place]? --    No data found.  Updated Vital Signs BP 118/68 (BP Location: Right Arm)   Pulse 69   Temp 98.3 F (36.8 C) (Oral)   Resp 17   SpO2 100%   Visual Acuity Right Eye Distance:   Left Eye Distance:   Bilateral Distance:    Right Eye Near:   Left Eye Near:    Bilateral Near:     Physical Exam Vitals signs and nursing note reviewed.  Constitutional:      Appearance: Normal appearance.  HENT:     Head: Normocephalic and atraumatic.     Nose: Nose normal.  Eyes:     Conjunctiva/sclera: Conjunctivae normal.  Pulmonary:     Effort: Pulmonary effort is normal.  Musculoskeletal: Normal range of motion.  Skin:    General: Skin is warm.     Findings: Rash present.      Comments: Widespread tinea rash to bilateral upper thigh area No crusting, drainage or oozing.  No blistering  Neurological:     Mental Status: She is alert.  Psychiatric:        Mood and Affect: Mood normal.      UC Treatments / Results  Labs (all labs ordered are listed, but only abnormal results are displayed) Labs Reviewed - No data to display  EKG None  Radiology No results found.  Procedures Procedures (including critical care time)  Medications Ordered in UC Medications - No data to display  Initial Impression / Assessment and Plan / UC Course  I have reviewed the triage vital signs and the nursing notes.  Pertinent labs & imaging results that were available during my care of the patient were reviewed by me and considered in my medical decision making (see chart for details).    Tinea corporis  Pt with worsening rash despite treatment for impetigo Most likely this is tinea corporis Will treat with fluconazole weekly for the next 4 weeks Follow up as needed for continued or worsening symptoms  Final Clinical Impressions(s) / UC Diagnoses   Final diagnoses:  Tinea     Discharge Instructions     I believe this is a fungal infection. We are treating you with fluconazole 200 mg  Take 1 tab weekly for 4 weeks Follow up as needed for continued or worsening symptoms You can take Zyrtec or Benadryl for itching    ED Prescriptions    Medication Sig Dispense Auth. Provider   fluconazole (DIFLUCAN) 200 MG tablet Take 1 tab once a week for 4 weeks 4 tablet Rozanna Box, Brycin Kille A, NP     Controlled Substance Prescriptions [place] Controlled Substance Registry consulted? Not Applicable   Orvan July, NP 12/06/18 [phone]

## 2018-12-06 NOTE — Discharge Instructions (Signed)
I believe this is a fungal infection. We are treating you with fluconazole 200 mg  Take 1 tab weekly for 4 weeks Follow up as needed for continued or worsening symptoms You can take Zyrtec or Benadryl for itching

## 2018-12-06 NOTE — ED Triage Notes (Signed)
Patient presents to Urgent Care with complaints of rash on bilateral upper thighs, extending to lower abdomen since 3 weeks ago. Patient reports she was treated here for it and given some cream, but the cream is gone and the rash is worse.

## 2019-01-02 ENCOUNTER — Emergency Department (HOSPITAL_COMMUNITY)
Admission: EM | Admit: 2019-01-02 | Discharge: 2019-01-02 | Disposition: A | Discharge status: Home / Self Care | Payer: Self-pay | Attending: Emergency Medicine | Admitting: Emergency Medicine

## 2019-01-02 ENCOUNTER — Encounter (HOSPITAL_COMMUNITY): Payer: Self-pay

## 2019-01-02 ENCOUNTER — Other Ambulatory Visit: Payer: Self-pay

## 2019-01-02 DIAGNOSIS — Z87891 Personal history of nicotine dependence: Secondary | ICD-10-CM | POA: Insufficient documentation

## 2019-01-02 DIAGNOSIS — R21 Rash and other nonspecific skin eruption: Secondary | ICD-10-CM | POA: Insufficient documentation

## 2019-01-02 DIAGNOSIS — J45909 Unspecified asthma, uncomplicated: Secondary | ICD-10-CM | POA: Insufficient documentation

## 2019-01-02 MED ORDER — TRIAMCINOLONE ACETONIDE 0.1 % EX CREA: 1.0000 "application " | TOPICAL_CREAM | Freq: Two times a day (BID) | CUTANEOUS | 0 refills | Status: DC

## 2019-01-02 NOTE — ED Triage Notes (Signed)
Patient states she went to an UC 4 weeks ago with rash on the back of her left leg. Patient was given a cream,but states the rash is worse.

## 2019-01-02 NOTE — Discharge Instructions (Signed)
You were evaluated in the Emergency Department and after careful evaluation, we did not find any emergent condition requiring admission or further testing in the hospital.  We recommend that you try the steroid cream as directed and follow-up with a dermatologist.  Please return to the Emergency Department if you experience any worsening of your condition.  We encourage you to follow up with a primary care provider.  Thank you for allowing [place] to be a part of your care.

## 2019-01-02 NOTE — ED Notes (Signed)
Patient left before this RN could update vitals Patient called and informed that her script is at the registration desk. Pt reports she will come back and get it.

## 2019-01-02 NOTE — ED Provider Notes (Signed)
[facility] Emergency Department Provider Note MRN:  [id number]  Arrival date & time: 01/02/19     Chief Complaint   Rash   History of Present Illness   Shari Davidson is a 28 y.o. year-old female with no pertinent past medical history presenting to the ED with chief complaint of rash.  1 month of rash to her thighs.  Rash is constant, described as itchy, was provided with Diflucan medication that did not help.  Denies fever, no nausea or vomiting, no shortness of breath, no chest pain, no dysuria, no joint pain.  Review of Systems  A complete 10 system review of systems was obtained and all systems are negative except as noted in the HPI and PMH.   Patient's Health History    Past Medical History:  Diagnosis Date  . Asthma   . Chlamydia 08/06/11   treated at Urgent Care  . Gonorrhea 04/20/12   treated at Urgent Care  . Migraine   . Trichomonas contact, treated     Past Surgical History:  Procedure Laterality Date  . NO PAST SURGERIES      Family History  Problem Relation Age of Onset  . Hypertension Other   . Healthy Mother   . Healthy Father     Social History   Socioeconomic History  . Marital status: Single    Spouse name: Not on file  . Number of children: Not on file  . Years of education: Not on file  . Highest education level: Not on file  Occupational History  . Not on file  Social Needs  . Financial resource strain: Not on file  . Food insecurity    Worry: Not on file    Inability: Not on file  . Transportation needs    Medical: Not on file    Non-medical: Not on file  Tobacco Use  . Smoking status: Former Smoker    Packs/day: 0.30    Types: Cigarettes    Quit date: 08/30/2014    Years since quitting: 4.3  . Smokeless tobacco: Never Used  Substance and Sexual Activity  . Alcohol use: Yes    Comment: OCCASIONAL  . Drug use: No  . Sexual activity: Yes    Birth control/protection: None  Lifestyle  . Physical  activity    Days per week: Not on file    Minutes per session: Not on file  . Stress: Not on file  Relationships  . Social Musicianconnections    Talks on phone: Not on file    Gets together: Not on file    Attends religious service: Not on file    Active member of club or organization: Not on file    Attends meetings of clubs or organizations: Not on file    Relationship status: Not on file  . Intimate partner violence    Fear of current or ex partner: Not on file    Emotionally abused: Not on file    Physically abused: Not on file    Forced sexual activity: Not on file  Other Topics Concern  . Not on file  Social History Narrative    Lives with mother and 2 sisters     Physical Exam  Vital Signs and Nursing Notes reviewed Vitals:   01/02/19 1820  BP: 120/76  Pulse: 70  Resp: 16  Temp: 98.9 F (37.2 C)  SpO2: 100%    CONSTITUTIONAL: Well-appearing, NAD NEURO:  Alert and oriented x 3, no focal deficits  EYES:  eyes equal and reactive ENT/NECK:  no LAD, no JVD CARDIO: Regular rate, well-perfused, normal S1 and S2 PULM:  CTAB no wheezing or rhonchi GI/GU:  normal bowel sounds, non-distended, non-tender MSK/SPINE:  No gross deformities, no edema SKIN: Macular patches of the bilateral thighs and popliteal regions with white flaky appearance PSYCH:  Appropriate speech and behavior  Diagnostic and Interventional Summary    Labs Reviewed - No data to display  No orders to display    Medications - No data to display   Procedures Critical Care  ED Course and Medical Decision Making  I have reviewed the triage vital signs and the nursing notes.  Pertinent labs & imaging results that were available during my care of the patient were reviewed by me and considered in my medical decision making (see below for details).  Normal vital signs, 1 month chronicity, no other red flags for other symptoms to suggest emergent process.  Favoring atopic process or eczema, will provide  triamcinolone cream and advised dermatology follow-up.  Barth Kirks. Sedonia Small, MD [place] [email or web address]  Final Clinical Impressions(s) / ED Diagnoses     ICD-10-CM   1. Rash  R21     ED Discharge Orders         Ordered    triamcinolone cream (KENALOG) 0.1 %  2 times daily     01/02/19 2032             Maudie Flakes, MD 01/02/19 2035

## 2019-01-26 ENCOUNTER — Emergency Department (HOSPITAL_COMMUNITY): Payer: Self-pay

## 2019-01-26 ENCOUNTER — Emergency Department (HOSPITAL_COMMUNITY)
Admission: EM | Admit: 2019-01-26 | Discharge: 2019-01-26 | Disposition: A | Discharge status: Home / Self Care | Payer: Self-pay | Attending: Emergency Medicine | Admitting: Emergency Medicine

## 2019-01-26 ENCOUNTER — Other Ambulatory Visit: Payer: Self-pay

## 2019-01-26 ENCOUNTER — Encounter (HOSPITAL_COMMUNITY): Payer: Self-pay

## 2019-01-26 DIAGNOSIS — Z79899 Other long term (current) drug therapy: Secondary | ICD-10-CM | POA: Insufficient documentation

## 2019-01-26 DIAGNOSIS — N760 Acute vaginitis: Secondary | ICD-10-CM | POA: Insufficient documentation

## 2019-01-26 DIAGNOSIS — J45909 Unspecified asthma, uncomplicated: Secondary | ICD-10-CM | POA: Insufficient documentation

## 2019-01-26 DIAGNOSIS — B9689 Other specified bacterial agents as the cause of diseases classified elsewhere: Secondary | ICD-10-CM

## 2019-01-26 DIAGNOSIS — Z87891 Personal history of nicotine dependence: Secondary | ICD-10-CM | POA: Insufficient documentation

## 2019-01-26 LAB — URINALYSIS, ROUTINE W REFLEX MICROSCOPIC
Bilirubin Urine: NEGATIVE
Glucose, UA: NEGATIVE mg/dL
Hgb urine dipstick: NEGATIVE
Ketones, ur: NEGATIVE mg/dL
Leukocytes,Ua: NEGATIVE
Nitrite: NEGATIVE
Protein, ur: NEGATIVE mg/dL
Specific Gravity, Urine: 1.013 (ref 1.005–1.030)
pH: 6 (ref 5.0–8.0)

## 2019-01-26 LAB — WET PREP, GENITAL
Sperm: NONE SEEN
Trich, Wet Prep: NONE SEEN
Yeast Wet Prep HPF POC: NONE SEEN

## 2019-01-26 LAB — I-STAT BETA HCG BLOOD, ED (MC, WL, AP ONLY): I-stat hCG, quantitative: <5 m[IU]/mL (ref <5)

## 2019-01-26 MED ORDER — ONDANSETRON 4 MG PO TBDP: 4.0000 mg | ORAL_TABLET | Freq: Three times a day (TID) | ORAL | 0 refills | Status: DC | PRN

## 2019-01-26 MED ORDER — METRONIDAZOLE 500 MG PO TABS: 500.0000 mg | ORAL_TABLET | Freq: Two times a day (BID) | ORAL | 0 refills | Status: DC

## 2019-01-26 NOTE — ED Triage Notes (Signed)
Patient c/o LLQ abdominal pain/cramps x 3-4 days. Patient also c/o of white vaginal discharge.

## 2019-01-26 NOTE — ED Provider Notes (Signed)
[facility]-EMERGENCY DEPT Provider Note   CSN: [id number] Arrival date & time: 01/26/19  1438     History   Chief Complaint Chief Complaint  Patient presents with   Abdominal Pain    HPI Shari Davidson is a 28 y.o. female.     The history is provided by the patient and medical records. No language interpreter was used.  Abdominal Pain Associated symptoms: dysuria and vaginal discharge   Associated symptoms: no constipation, no diarrhea, no nausea and no vomiting    Shari Davidson is a 28 y.o. female  with a PMH as listed below who presents to the Emergency Department complaining of vaginal discharge for the last 1 week.  Associated with lower abdominal cramping, left greater than right.  History of bacterial vaginosis in the past and this feels similar.  She is sexually active and having unprotected intercourse.  Denies nausea or vomiting.  No back pain, chest pain or shortness of breath.  She did have some discomfort when urinating, but feels different than UTIs that she is experienced in the past.   Past Medical History:  Diagnosis Date   Asthma    Chlamydia 08/06/11   treated at Urgent Care   Gonorrhea 04/20/12   treated at Urgent Care   Migraine    Trichomonas contact, treated     Patient Active Problem List   Diagnosis Date Noted   Depression, major, recurrent, moderate (HCC)    Vaginal yeast infection 04/18/2013   Difficulty urinating 04/18/2013   Dysuria 08/09/2012   UTI (lower urinary tract infection) 10/22/2011   Possible exposure to STD 08/27/2011   Dysfunctional uterine bleeding 08/27/2011   Contraception management 06/03/2011   Vaginal bleeding 05/05/2011   Lightheadedness 03/19/2011   CONSTIPATION 07/28/2010   ACNE, MILD 07/28/2010   MYOPIA 07/08/2007   MIGRAINE, UNSPEC., W/O INTRACTABLE MIGRAINE 08/26/2006   RHINITIS, ALLERGIC 08/26/2006   ASTHMA, INTERMITTENT 08/26/2006    Past  Surgical History:  Procedure Laterality Date   NO PAST SURGERIES       OB History    Gravida  1   Para  0   Term      Preterm      AB  1   Living  0     SAB  1   TAB      Ectopic      Multiple      Live Births               Home Medications    Prior to Admission medications   Medication Sig Start Date End Date Taking? Authorizing Provider  Multiple Vitamin (MULTIVITAMIN) tablet Take 1 tablet by mouth daily.   Yes [provider]  acetaminophen (TYLENOL 8 HOUR) 650 MG CR tablet Take 1 tablet (650 mg total) by mouth every 8 (eight) hours as needed for pain. Patient not taking: Reported on 11/27/2017 03/28/17   Derwood KaplanNanavati, Ankit, MD  fluconazole (DIFLUCAN) 200 MG tablet Take 1 tab once a week for 4 weeks Patient not taking: Reported on 01/26/2019 12/06/18   Dahlia ByesBast, Traci A, NP  metroNIDAZOLE (FLAGYL) 500 MG tablet Take 1 tablet (500 mg total) by mouth 2 (two) times daily. 01/26/19   Dalton Mille, Chase PicketJaime Pilcher, PA-C  triamcinolone cream (KENALOG) 0.1 % Apply 1 application topically 2 (two) times daily. Patient not taking: Reported on 01/26/2019 01/02/19   Sabas SousBero, Michael M, MD    Family History Family History  Problem Relation Age of Onset  Hypertension Other    Healthy Mother    Healthy Father     Social History Social History   Tobacco Use   Smoking status: Former Smoker    Packs/day: 0.30    Types: Cigarettes    Quit date: 08/30/2014    Years since quitting: 4.4   Smokeless tobacco: Never Used  Substance Use Topics   Alcohol use: Yes    Comment: OCCASIONAL   Drug use: No     Allergies   Patient has no known allergies.   Review of Systems Review of Systems  Gastrointestinal: Positive for abdominal pain. Negative for constipation, diarrhea, nausea and vomiting.  Genitourinary: Positive for dysuria, pelvic pain and vaginal discharge. Negative for difficulty urinating.  All other systems reviewed and are negative.    Physical Exam Updated  Vital Signs BP 122/61    Pulse 63    Temp 99 F (37.2 C) (Oral)    Resp 18    Ht 5\' 4"  (1.626 m)    Wt 62.4 kg    LMP 01/02/2019    SpO2 100%    BMI 23.60 kg/m   Physical Exam Vitals signs and nursing note reviewed.  Constitutional:      General: She is not in acute distress.    Appearance: She is well-developed.  HENT:     Head: Normocephalic and atraumatic.  Neck:     Musculoskeletal: Neck supple.  Cardiovascular:     Rate and Rhythm: Normal rate and regular rhythm.     Heart sounds: Normal heart sounds. No murmur.  Pulmonary:     Effort: Pulmonary effort is normal. No respiratory distress.     Breath sounds: Normal breath sounds.  Abdominal:     General: There is no distension.     Palpations: Abdomen is soft.     Comments: No abdominal tenderness.  No flank or CVA tenderness.  Genitourinary:    Comments: Chaperone present for exam. + white discharge. No CMT. She did have right adnexal tenderness. No fullness appreciated. No bleeding within vaginal vault. Skin:    General: Skin is warm and dry.  Neurological:     Mental Status: She is alert and oriented to person, place, and time.      ED Treatments / Results  Labs (all labs ordered are listed, but only abnormal results are displayed) Labs Reviewed  WET PREP, GENITAL - Abnormal; Notable for the following components:      Result Value   Clue Cells Wet Prep HPF POC PRESENT (*)    WBC, Wet Prep HPF POC FEW (*)    All other components within normal limits  URINALYSIS, ROUTINE W REFLEX MICROSCOPIC  I-STAT BETA HCG BLOOD, ED ([facility], [facility], [facility] ONLY)  GC/CHLAMYDIA PROBE AMP ([place]) NOT AT [facility]    EKG None  Radiology [place] Transvaginal Non-ob  Result Date: 01/26/2019 CLINICAL DATA:  Pelvic pain EXAM: TRANSABDOMINAL AND TRANSVAGINAL ULTRASOUND OF PELVIS DOPPLER ULTRASOUND OF OVARIES TECHNIQUE: Both transabdominal and transvaginal ultrasound examinations of the pelvis were performed. Transabdominal technique was performed  for global imaging of the pelvis including uterus, ovaries, adnexal regions, and pelvic cul-de-sac. It was necessary to proceed with endovaginal exam following the transabdominal exam to visualize the uterus endometrium ovaries. Color and duplex Doppler ultrasound was utilized to evaluate blood flow to the ovaries. COMPARISON:  Ultrasound 11/06/2011 FINDINGS: Uterus Measurements: 6 x 2.6 x 3.8 cm, volume of 31.7 mL. No fibroids or other mass visualized. Endometrium Thickness: 3.8 mm.  No mass  or focal abnormality. Right ovary Measurements: 3.6 by 2 x 2 cm = volume: 7.2 mL. Normal appearance/no adnexal mass. Left ovary Measurements: 3.3 x 1.7 x 1.9 cm = volume: 5.4 mL. Normal appearance/no adnexal mass. Pulsed Doppler evaluation of both ovaries demonstrates normal low-resistance arterial and venous waveforms. Other findings No abnormal free fluid. IMPRESSION: Negative pelvic ultrasound.  No evidence for ovarian torsion. Electronically Signed   By: Donavan Foil M.D.   On: 01/26/2019 19:33   US Pelvis Complete  Result Date: 01/26/2019 CLINICAL DATA:  Pelvic pain EXAM: TRANSABDOMINAL AND TRANSVAGINAL ULTRASOUND OF PELVIS DOPPLER ULTRASOUND OF OVARIES TECHNIQUE: Both transabdominal and transvaginal ultrasound examinations of the pelvis were performed. Transabdominal technique was performed for global imaging of the pelvis including uterus, ovaries, adnexal regions, and pelvic cul-de-sac. It was necessary to proceed with endovaginal exam following the transabdominal exam to visualize the uterus endometrium ovaries. Color and duplex Doppler ultrasound was utilized to evaluate blood flow to the ovaries. COMPARISON:  Ultrasound 11/06/2011 FINDINGS: Uterus Measurements: 6 x 2.6 x 3.8 cm, volume of 31.7 mL. No fibroids or other mass visualized. Endometrium Thickness: 3.8 mm.  No mass or focal abnormality. Right ovary Measurements: 3.6 by 2 x 2 cm = volume: 7.2 mL. Normal appearance/no adnexal mass. Left ovary  Measurements: 3.3 x 1.7 x 1.9 cm = volume: 5.4 mL. Normal appearance/no adnexal mass. Pulsed Doppler evaluation of both ovaries demonstrates normal low-resistance arterial and venous waveforms. Other findings No abnormal free fluid. IMPRESSION: Negative pelvic ultrasound.  No evidence for ovarian torsion. Electronically Signed   By: Donavan Foil M.D.   On: 01/26/2019 19:33   [place] Art/ven Flow Abd Pelv Doppler  Result Date: 01/26/2019 CLINICAL DATA:  Pelvic pain EXAM: TRANSABDOMINAL AND TRANSVAGINAL ULTRASOUND OF PELVIS DOPPLER ULTRASOUND OF OVARIES TECHNIQUE: Both transabdominal and transvaginal ultrasound examinations of the pelvis were performed. Transabdominal technique was performed for global imaging of the pelvis including uterus, ovaries, adnexal regions, and pelvic cul-de-sac. It was necessary to proceed with endovaginal exam following the transabdominal exam to visualize the uterus endometrium ovaries. Color and duplex Doppler ultrasound was utilized to evaluate blood flow to the ovaries. COMPARISON:  Ultrasound 11/06/2011 FINDINGS: Uterus Measurements: 6 x 2.6 x 3.8 cm, volume of 31.7 mL. No fibroids or other mass visualized. Endometrium Thickness: 3.8 mm.  No mass or focal abnormality. Right ovary Measurements: 3.6 by 2 x 2 cm = volume: 7.2 mL. Normal appearance/no adnexal mass. Left ovary Measurements: 3.3 x 1.7 x 1.9 cm = volume: 5.4 mL. Normal appearance/no adnexal mass. Pulsed Doppler evaluation of both ovaries demonstrates normal low-resistance arterial and venous waveforms. Other findings No abnormal free fluid. IMPRESSION: Negative pelvic ultrasound.  No evidence for ovarian torsion. Electronically Signed   By: Donavan Foil M.D.   On: 01/26/2019 19:33    Procedures Procedures (including critical care time)  Medications Ordered in ED Medications - No data to display   Initial Impression / Assessment and Plan / ED Course  I have reviewed the triage vital signs and the nursing  notes.  Pertinent labs & imaging results that were available during my care of the patient were reviewed by me and considered in my medical decision making (see chart for details).       Shari Davidson is a 28 y.o. female who presents to ED for vaginal dischargeand lower abdominal discomfort for about a week.  On exam, patient is afebrile, hemodynamically stable with no abdominal tenderness.  She does have white vaginal discharge on  exam as well as right adnexal tenderness.  No fullness or mass appreciated.  Urinalysis without signs of infection.  Wet prep consistent with BV.  Transvaginal ultrasound without abnormality.  No evidence of torsion or TOA.  Will treat with Flagyl and have patient follow-up with GYN.  Reasons to return to the ER were discussed and all questions were answered.   Final Clinical Impressions(s) / ED Diagnoses   Final diagnoses:  BV (bacterial vaginosis)    ED Discharge Orders         Ordered    metroNIDAZOLE (FLAGYL) 500 MG tablet  2 times daily     01/26/19 1943           Shataria Crist, Chase PicketJaime Pilcher, PA-C 01/26/19 1947    Melene PlanFloyd, Dan, DO 01/26/19 1951

## 2019-01-26 NOTE — Discharge Instructions (Signed)
It was my pleasure taking care of you today!   Please take all of your antibiotics until finished!  Follow-up with your OB/GYN.  If you do not have one, you can call the clinic listed to schedule a follow-up appointment.  Return to the ER for new or worsening symptoms, any additional concerns.

## 2019-01-28 LAB — GC/CHLAMYDIA PROBE AMP (~~LOC~~) NOT AT ARMC
Chlamydia: NEGATIVE
Neisseria Gonorrhea: NEGATIVE

## 2019-03-27 NOTE — Progress Notes (Signed)
   Subjective:    Patient ID: Shari Davidson, female    DOB: [birth year], 28 y.o.   MRN: [id number]   CC: Shari Davidson is a 28 yr old female who has come to establish a new PCP visit.  HPI: Pt's main concerns today are:  Asthma Pt has had Asthma diagnosed since childhood. Reports she has infrequent flares of asthma, one every few months. Usual triggers are change in weather and stress. She last used an inhaler last year which was a "yellow inhaler", unsure of the name. Reports visits to the ED to get inhalers. Pt had SOB, chest tightness, wheeze and dry cough last weekend which she felt was her asthma and was concerned. Would like to have inhalers now and establish good health care. Denies fevers.  Poor sleep Has had trouble sleeping for many years. Will feel mind racing at night and unable to sleep until early hrs of the morning. Reports it can be linked to anxiety.Also uses electronic devices ie phone before bedtime. Denies low mood, anorexia, anhedonia or low libido.  Constipation Opens bowels every 4-5 days. Admits to poor diet eating lots of "junk food" with minimal fruit and veg.  Pre-conception Pt is trying to conceive with boyfriend. Hx of miscarriage 3 yrs ago. Seeing fertility clinic for HSG. Reports they advised her to stop taking her folic acid and prenatal vitamins. Reports irregular periods.   Social hx Pt smokes cigarettes once a month. Drinks 3 drinks a week. Denies illicit drug use.  Smoking status reviewed   ROS: pertinent noted in the HPI    Past medical history, surgical, family, and social history reviewed and updated in the EMR as appropriate. Reviewed problem list.   Objective:  BP (!) 100/54   Pulse 82   Wt 139 lb (63 kg)   LMP 03/10/2019 (Exact Date)   SpO2 98%   BMI 23.86 kg/m   Vitals and nursing note reviewed  General: NAD, pleasant, able to participate in exam Cardiac: RRR, S1 S2 present. normal heart sounds, no murmurs.  Respiratory: CTAB, normal effort, No wheezes, rales or rhonchi Extremities: no edema or cyanosis. Skin: warm and dry, no rashes noted Neuro: alert, no obvious focal deficits Psych: Normal affect and mood   Assessment & Plan:    Asthma Likely uncontrolled Asthma. Pt would benefit from Breo inhaler, to be used on PRN basis. Also will organize Spirometry with Dr Valentina Lucks in the spring (not now due to [place]).   Poor sleep hygiene Advised pt to improve sleep hygiene by limiting caffeine intake after noon, limiting screen time in the evenings, dimming the bedroom lights, limiting the bedroom for only work and intercourse. Advised to try breathing exercises/meditations and to try melatonin supplements. Sleeping tablets not indicated currently.  Nicotine dependence Pt advised smoking cessation to increase chances of conception and healthy pregnancy and baby.  CONSTIPATION Likely secondary to poor diet and lifestyle. Advised pt to increase fibre intake in diet, increase water intake and to take OTC laxatives PRN if required.   Pt given information to access medication assistance and resources for free Pap smear.   Lattie Haw, MD  [place] PGY-1

## 2019-03-28 ENCOUNTER — Other Ambulatory Visit: Payer: Self-pay

## 2019-03-28 ENCOUNTER — Ambulatory Visit (INDEPENDENT_AMBULATORY_CARE_PROVIDER_SITE_OTHER): Payer: No Typology Code available for payment source | Admitting: Family Medicine

## 2019-03-28 ENCOUNTER — Encounter: Payer: Self-pay | Admitting: Family Medicine

## 2019-03-28 VITALS — BP 100/54 | HR 82 | Wt 139.0 lb

## 2019-03-28 DIAGNOSIS — J45909 Unspecified asthma, uncomplicated: Secondary | ICD-10-CM | POA: Diagnosis not present

## 2019-03-28 DIAGNOSIS — Z0000 Encounter for general adult medical examination without abnormal findings: Secondary | ICD-10-CM

## 2019-03-28 DIAGNOSIS — F17200 Nicotine dependence, unspecified, uncomplicated: Secondary | ICD-10-CM | POA: Insufficient documentation

## 2019-03-28 DIAGNOSIS — Z72821 Inadequate sleep hygiene: Secondary | ICD-10-CM

## 2019-03-28 DIAGNOSIS — F17210 Nicotine dependence, cigarettes, uncomplicated: Secondary | ICD-10-CM

## 2019-03-28 NOTE — Assessment & Plan Note (Signed)
Pt advised smoking cessation to increase chances of conception and healthy pregnancy and baby.

## 2019-03-28 NOTE — Assessment & Plan Note (Signed)
Likely secondary to poor diet and lifestyle. Advised pt to increase fibre intake in diet, increase water intake and to take OTC laxatives PRN if required.

## 2019-03-28 NOTE — Assessment & Plan Note (Signed)
Advised pt to improve sleep hygiene by limiting caffeine intake after noon, limiting screen time in the evenings, dimming the bedroom lights, limiting the bedroom for only work and intercourse. Advised to try breathing exercises/meditations and to try melatonin supplements. Sleeping tablets not indicated currently.

## 2019-03-28 NOTE — Patient Instructions (Addendum)
[place],  It was lovely to meet you today!  Today we discussed a few things: Asthma-you have been having asthma flares every few months and a more recent episode due to the changes in the weather. I have given you a free sample of the Breo inhaler which you can take on an as needed basis.  We discussed improving your sleep hygiene as you have been from poor sleep for many years.  Some sleep hygiene methods include limiting caffeine intake afternoon, dimming the lights in the evening, limiting screen times in the evenings, exercising and keeping the bedroom for only sleep and sexual intercourse.  Because you are trying to get pregnant I recommend that you smoking and improve your diet by eating more fruits and vegetables, and less processed foods.This will also help with the constipation that you have been having  Please see the below information for organizing insurance and a free Pap smear.   Insurance can be expensive---there are resources for you!   You can always call our clinic to schedule an appointment with Ree Shay, who can help with insurance questions and applications for assistance programs.  You can also schedule with her at the front desk.   Department of Social Services in [place] Address: [address] [place], [place] [address] Phone: [phone]  Medication Assistance Program- [place]  Contact Ree Shay (Front Desk at [facility]) about this  [employer] you to obtain medications from the health department at low cost and see doctors in the [employer]  Your orange card gives you access to everything that the [facility] has to offer. All you need to do to take part in your health care is to give [place] a call at [phone].  Pharmaceutical Companies--- [employer] and [employer] have insulin available---please check out online resources for this   Pap Smear for Free  Apr 24, 2019 Monday 10:00 AM  - 4:00 PM [facility] Cervical [facility] Control Program Clinic [place], [place] FREE Pap Smear Screening for all women who cannot afford to see their physician.  Registration Details Please call [phone] to register for screening. Appointment is required for screening, walk-ins will not be seen.  Fees & Payment FREE  Tobacco use is damaging to your body. It increases your risk of stroke, heart attack, lung cancer, and serious lung disease in the future. It also reduces your fertility.   Quitting tobacco is the best thing for your health but is a challenge---nicotine, a chemical in cigarettes, is highly addictive.   You can call 1 800 QUIT NOW (3867764113)---you will be connected with a Careers information officer. They can also mail you nicotine gums, lozenges, and patches to quit.   Ask me about patches (which you wear all day) and gums (which you use when you have a craving) to help you quit.   There are safe, effective medications to help you quit--  Varencline---also called Chantix---- is the most common medication used to help people stop smoking. It starts a low dose and is increased. I recommended choosing a quit date then starting the medication 8 days before this. Side effects include mild headache, difficulty sleeping, and odd dreams. The medication is typically very well tolerated.   Bupropion---also called Zyban---- is started 1 week before your quit date. You take 1 pill for three days then increase to 1 pill twice per day. Side effects include a mild headache and anxiety---this usually goes away. Some  patients experience weight loss.    I look forward to seeing you again! Please do not hesitate to call the clinic if you have any questions or concerns.   Best wishes,  Dr Lattie Haw

## 2019-03-28 NOTE — Assessment & Plan Note (Addendum)
Likely uncontrolled Asthma. Pt would benefit from Breo inhaler, to be used on PRN basis. Sample of Breo given to pt. Also will organize Spirometry with Dr Valentina Lucks in the spring (not now due to [place]).

## 2019-03-29 NOTE — Addendum Note (Signed)
Addended by: Owens Shark, Lilianah Buffin on: 03/29/2019 08:50 AM   Modules accepted: Level of Service

## 2019-04-05 ENCOUNTER — Ambulatory Visit: Payer: No Typology Code available for payment source | Admitting: Family Medicine

## 2019-04-08 ENCOUNTER — Other Ambulatory Visit: Payer: Self-pay

## 2019-04-08 ENCOUNTER — Inpatient Hospital Stay (HOSPITAL_COMMUNITY)
Admission: AD | Admit: 2019-04-08 | Discharge: 2019-04-08 | Disposition: A | Discharge status: Home / Self Care | Payer: Medicaid Other | Attending: Obstetrics & Gynecology | Admitting: Obstetrics & Gynecology

## 2019-04-08 ENCOUNTER — Inpatient Hospital Stay (HOSPITAL_COMMUNITY): Payer: Medicaid Other

## 2019-04-08 ENCOUNTER — Encounter (HOSPITAL_COMMUNITY): Payer: Self-pay

## 2019-04-08 DIAGNOSIS — O3680X0 Pregnancy with inconclusive fetal viability, not applicable or unspecified: Secondary | ICD-10-CM

## 2019-04-08 DIAGNOSIS — R109 Unspecified abdominal pain: Secondary | ICD-10-CM | POA: Insufficient documentation

## 2019-04-08 DIAGNOSIS — Z3A01 Less than 8 weeks gestation of pregnancy: Secondary | ICD-10-CM | POA: Insufficient documentation

## 2019-04-08 DIAGNOSIS — O26891 Other specified pregnancy related conditions, first trimester: Secondary | ICD-10-CM | POA: Insufficient documentation

## 2019-04-08 DIAGNOSIS — Z87891 Personal history of nicotine dependence: Secondary | ICD-10-CM | POA: Diagnosis not present

## 2019-04-08 LAB — URINALYSIS, ROUTINE W REFLEX MICROSCOPIC
Bilirubin Urine: NEGATIVE
Glucose, UA: NEGATIVE mg/dL
Hgb urine dipstick: NEGATIVE
Ketones, ur: NEGATIVE mg/dL
Leukocytes,Ua: NEGATIVE
Nitrite: NEGATIVE
Protein, ur: NEGATIVE mg/dL
Specific Gravity, Urine: 1.015 (ref 1.005–1.030)
pH: 6 (ref 5.0–8.0)

## 2019-04-08 LAB — HCG, QUANTITATIVE, PREGNANCY: hCG, Beta Chain, Quant, S: 6281 m[IU]/mL — ABNORMAL HIGH (ref <5)

## 2019-04-08 LAB — POCT PREGNANCY, URINE: Preg Test, Ur: POSITIVE — AB

## 2019-04-08 LAB — WET PREP, GENITAL
Sperm: NONE SEEN
Trich, Wet Prep: NONE SEEN
Yeast Wet Prep HPF POC: NONE SEEN

## 2019-04-08 LAB — CBC
HCT: 35.3 % — ABNORMAL LOW (ref 36.0–46.0)
Hemoglobin: 12.4 g/dL (ref 12.0–15.0)
MCH: 33.2 pg (ref 26.0–34.0)
MCHC: 35.1 g/dL (ref 30.0–36.0)
MCV: 94.6 fL (ref 80.0–100.0)
Platelets: 245 10*3/uL (ref 150–400)
RBC: 3.73 MIL/uL — ABNORMAL LOW (ref 3.87–5.11)
RDW: 11.5 % (ref 11.5–15.5)
WBC: 3.6 10*3/uL — ABNORMAL LOW (ref 4.0–10.5)
nRBC: 0 % (ref 0.0–0.2)

## 2019-04-08 LAB — HIV ANTIBODY (ROUTINE TESTING W REFLEX): HIV Screen 4th Generation wRfx: NONREACTIVE

## 2019-04-08 NOTE — Discharge Instructions (Signed)
Advised to return with worsening symptoms as an ectopic pregnancy cannot be ruled out at this time and this could be a life threatening condition. Pelvic rest - no tampons, no douching, no sex, nothing in the vagina. No heavy lifting or strenuous exercise. The clinic will call you to come to have labs drawn on Tuesday morning.

## 2019-04-08 NOTE — MAU Provider Note (Signed)
History     CSN: [id number]  Arrival date and time: 04/08/19 1700   First Provider Initiated Contact with Patient 04/08/19 1818      Chief Complaint  Patient presents with  . Abdominal Pain   HPI Shari Davidson 28 y.o. [id number] Comes to MAU with Right sided pain since yesterday.  It goes and comes.  Has been having difficulty getting pregnant and has started going to [facility] but has not had any treatments there.  Yesterday she did a home pregnancy test and it was positive.  Is not bleeding.  Is worried about this pregnancy.  Had a miscarriage at 5 weeks in her only previous pregnancy.  OB History    Gravida  2   Para  0   Term      Preterm      AB  1   Living  0     SAB  1   TAB      Ectopic      Multiple      Live Births              Past Medical History:  Diagnosis Date  . Asthma   . Chlamydia 08/06/11   treated at Urgent Care  . Gonorrhea 04/20/12   treated at Urgent Care  . Migraine   . Trichomonas contact, treated     Past Surgical History:  Procedure Laterality Date  . NO PAST SURGERIES      Family History  Problem Relation Age of Onset  . Hypertension Other   . Healthy Mother   . Healthy Father     Social History   Tobacco Use  . Smoking status: Former Smoker    Packs/day: 0.30    Types: Cigarettes    Quit date: 08/30/2014    Years since quitting: 4.6  . Smokeless tobacco: Never Used  Substance Use Topics  . Alcohol use: Not Currently    Comment: OCCASIONAL  . Drug use: No    Allergies: No Known Allergies  Medications Prior to Admission  Medication Sig Dispense Refill Last Dose  . acetaminophen (TYLENOL 8 HOUR) 650 MG CR tablet Take 1 tablet (650 mg total) by mouth every 8 (eight) hours as needed for pain. (Patient not taking: Reported on 11/27/2017) 30 tablet 0   . fluconazole (DIFLUCAN) 200 MG tablet Take 1 tab once a week for 4 weeks (Patient not taking: Reported on 01/26/2019) 4 tablet 0   .  metroNIDAZOLE (FLAGYL) 500 MG tablet Take 1 tablet (500 mg total) by mouth 2 (two) times daily. 14 tablet 0   . Multiple Vitamin (MULTIVITAMIN) tablet Take 1 tablet by mouth daily.     . ondansetron (ZOFRAN ODT) 4 MG disintegrating tablet Take 1 tablet (4 mg total) by mouth every 8 (eight) hours as needed for nausea or vomiting. 20 tablet 0   . triamcinolone cream (KENALOG) 0.1 % Apply 1 application topically 2 (two) times daily. (Patient not taking: Reported on 01/26/2019) 30 g 0     Review of Systems  Constitutional: Negative for fever.  HENT: Negative for sore throat.   Respiratory: Negative for cough and shortness of breath.   Gastrointestinal: Negative for constipation, diarrhea, nausea and vomiting.       Right side pain  Genitourinary: Negative for dysuria, vaginal bleeding and vaginal discharge.   Physical Exam   Blood pressure (!) 125/59, pulse 87, temperature 98.7 F (37.1 C), temperature source Oral, resp. rate 16, last menstrual period  03/10/2019, SpO2 99 %.  Physical Exam  Nursing note and vitals reviewed. Constitutional: She is oriented to person, place, and time. She appears well-developed and well-nourished.  HENT:  Head: Normocephalic.  Eyes: EOM are normal.  Neck: Neck supple.  GI: Soft. There is no abdominal tenderness. There is no rebound and no guarding.  Pain that client is having is more on the right side and above the pelvic inlet.  Genitourinary:    Genitourinary Comments: Speculum exam: Vagina - Small amount of clear discharge, no odor Cervix - No bleeding Bimanual exam: Cervix closed Uterus non tender, normal size Adnexa non tender, no masses bilaterally GC/Chlam, wet prep done Chaperone present for exam.    Musculoskeletal: Normal range of motion.  Neurological: She is alert and oriented to person, place, and time.  Skin: Skin is warm and dry.  Psychiatric: She has a normal mood and affect.    MAU Course  Procedures Labs Results for orders  placed or performed during the hospital encounter of 04/08/19 (from the past 24 hour(s))  Pregnancy, urine POC     Status: Abnormal   Collection Time: 04/08/19  5:18 PM  Result Value Ref Range   Preg Test, Ur POSITIVE (A) NEGATIVE  Urinalysis, Routine w reflex microscopic     Status: Abnormal   Collection Time: 04/08/19  5:19 PM  Result Value Ref Range   Color, Urine YELLOW YELLOW   APPearance HAZY (A) CLEAR   Specific Gravity, Urine 1.015 1.005 - 1.030   pH 6.0 5.0 - 8.0   Glucose, UA NEGATIVE NEGATIVE mg/dL   Hgb urine dipstick NEGATIVE NEGATIVE   Bilirubin Urine NEGATIVE NEGATIVE   Ketones, ur NEGATIVE NEGATIVE mg/dL   Protein, ur NEGATIVE NEGATIVE mg/dL   Nitrite NEGATIVE NEGATIVE   Leukocytes,Ua NEGATIVE NEGATIVE  CBC     Status: Abnormal   Collection Time: 04/08/19  6:55 PM  Result Value Ref Range   WBC 3.6 (L) 4.0 - 10.5 K/uL   RBC 3.73 (L) 3.87 - 5.11 MIL/uL   Hemoglobin 12.4 12.0 - 15.0 g/dL   HCT 19.5 (L) 09.3 - 26.7 %   MCV 94.6 80.0 - 100.0 fL   MCH 33.2 26.0 - 34.0 pg   MCHC 35.1 30.0 - 36.0 g/dL   RDW 12.4 58.0 - 99.8 %   Platelets 245 150 - 400 K/uL   nRBC 0.0 0.0 - 0.2 %  hCG, quantitative, pregnancy     Status: Abnormal   Collection Time: 04/08/19  6:55 PM  Result Value Ref Range   hCG, Beta Chain, Quant, S 6,281 (H) <5 mIU/mL  Wet prep, genital     Status: Abnormal   Collection Time: 04/08/19  7:15 PM   Specimen: Vaginal  Result Value Ref Range   Yeast Wet Prep HPF POC NONE SEEN NONE SEEN   Trich, Wet Prep NONE SEEN NONE SEEN   Clue Cells Wet Prep HPF POC PRESENT (A) NONE SEEN   WBC, Wet Prep HPF POC FEW (A) NONE SEEN   Sperm NONE SEEN     MDM No evidence of BV seen on exam.  Pain is not in an area that is typical for ectopic however could be referred pain, so will do ectopic workup. Preliminary [place] results reviewed. Having pain with a positive pregnancy test and since no yolk sac seen on ultrasound, diagnosis of pregnancy of unknown anatomic  location is seen. Return to MAU with severe vaginal bleeding or severe abdominal pain.  Patient informed that at  this time the pregnancy cannot be confirmed to be in the uterus as no definite yolk sac is visualized on ultrasound.  Advised to return with worsening symptoms as an ectopic pregnancy cannot be ruled out at this time and this could be a life threatening condition. Pelvic rest - no tampons, no douching, no sex, nothing in the vagina. No heavy lifting or strenuous exercise.  Assessment and Plan  Abdominal and side pain pregnancy of unknown anatomic location   Plan See above for instructions. Message sent to [facility] office to schedule for stat lab on Tuesday. Advised to return to MAU if pain worsen, having vaginal bleeding or other problems.   Terri L Burleson 04/08/2019, 6:18 PM

## 2019-04-08 NOTE — MAU Note (Signed)
Pt reports to mau with c/o lower right abd pain.  Pt denies any vag bleeding. Pt reports increased urinary urgency.

## 2019-04-09 LAB — RPR: RPR Ser Ql: NONREACTIVE

## 2019-04-11 ENCOUNTER — Other Ambulatory Visit: Payer: Self-pay

## 2019-04-11 ENCOUNTER — Ambulatory Visit (INDEPENDENT_AMBULATORY_CARE_PROVIDER_SITE_OTHER): Payer: No Typology Code available for payment source | Admitting: Emergency Medicine

## 2019-04-11 DIAGNOSIS — O3680X0 Pregnancy with inconclusive fetal viability, not applicable or unspecified: Secondary | ICD-10-CM

## 2019-04-11 LAB — BETA HCG QUANT (REF LAB): hCG Quant: 11935 m[IU]/mL

## 2019-04-11 NOTE — Progress Notes (Signed)
Chart reviewed for nurse visit. Agree with plan of care.   Talbert Trembath Lorraine, CNM 04/11/2019 5:07 PM   

## 2019-04-11 NOTE — Progress Notes (Signed)
Pt here today for beta HCG lab draw. Pt denies bleeding. Pt reports having occasional abdominal cramping that "is not bad enough to go to the hospital". Pt informed of ectopic precautions and encouraged to be seen in MAU if symptoms are experienced.   Beta HCG 11,615 today. Per Curt Bears, CNM this is an appropriate rise in levels and ultrasound to be scheduled in 10-14 days to verify location of pregnancy.   Pt called and informed of results and plan of care. Pt was instructed to arrive with a full bladder at 12:45 for her ultrasound appointment on 10/22 at 1pm. Pt verbalized understanding and had no further questions.  Loma Sousa, RN 04/11/19 1623

## 2019-04-17 LAB — GC/CHLAMYDIA PROBE AMP (~~LOC~~) NOT AT ARMC
Chlamydia: NEGATIVE
Comment: NEGATIVE
Comment: NORMAL
Neisseria Gonorrhea: NEGATIVE

## 2019-04-19 ENCOUNTER — Ambulatory Visit: Payer: No Typology Code available for payment source | Admitting: Family Medicine

## 2019-04-19 NOTE — Progress Notes (Deleted)
   Subjective:    Patient ID: [name], female    DOB: 03/13/1991, 28 y.o.   MRN: [id number]   CC: Korbyn Vanes is a 28 yr old who has come in today for a pap smear   HPI:   Smoking status reviewed   ROS: pertinent noted in the HPI    Past medical history, surgical, family, and social history reviewed and updated in the EMR as appropriate. Reviewed problem list.   Objective:  LMP 03/10/2019 (Exact Date)   Vitals and nursing note reviewed  General: NAD, pleasant, able to participate in exam Cardiac: RRR, S1 S2 present. normal heart sounds, no murmurs. Respiratory: CTAB, normal effort, No wheezes, rales or rhonchi Extremities: no edema or cyanosis. Skin: warm and dry, no rashes noted Neuro: alert, no obvious focal deficits Psych: Normal affect and mood   Assessment & Plan:    No problem-specific Assessment & Plan notes found for this encounter.   Lattie Haw, MD  [facility] PGY-1

## 2019-04-20 ENCOUNTER — Ambulatory Visit (HOSPITAL_COMMUNITY)
Admission: RE | Admit: 2019-04-20 | Discharge: 2019-04-20 | Disposition: A | Discharge status: Home / Self Care | Payer: Medicaid Other | Source: Ambulatory Visit | Attending: Student | Admitting: Student

## 2019-04-20 ENCOUNTER — Encounter: Payer: Self-pay | Admitting: Obstetrics & Gynecology

## 2019-04-20 ENCOUNTER — Ambulatory Visit (INDEPENDENT_AMBULATORY_CARE_PROVIDER_SITE_OTHER): Payer: Self-pay

## 2019-04-20 ENCOUNTER — Other Ambulatory Visit: Payer: Self-pay

## 2019-04-20 DIAGNOSIS — Z712 Person consulting for explanation of examination or test findings: Secondary | ICD-10-CM

## 2019-04-20 DIAGNOSIS — O3680X0 Pregnancy with inconclusive fetal viability, not applicable or unspecified: Secondary | ICD-10-CM | POA: Diagnosis not present

## 2019-04-20 NOTE — Progress Notes (Signed)
Pt here today for OB US results.  Pt denies any pain or bleeding.  Medications and allergies reviewed.  Notified Noni Saupe, NP results- no new orders.  Pt informed that babies FHR 115 bpm and EDD 12/13/19 6w 1d.  Proof of pregnancy letter provided by the front office.   Mel Almond, RN 04/21/19

## 2019-04-21 NOTE — Progress Notes (Signed)
I agree with the nurses note and plan of care.   Lezlie Lye, NP 04/21/2019 9:49 AM

## 2019-05-06 ENCOUNTER — Encounter (HOSPITAL_COMMUNITY): Payer: Self-pay

## 2019-05-06 ENCOUNTER — Inpatient Hospital Stay (HOSPITAL_COMMUNITY)
Admission: AD | Admit: 2019-05-06 | Discharge: 2019-05-06 | Disposition: A | Discharge status: Home / Self Care | Payer: Medicaid Other | Attending: Obstetrics and Gynecology | Admitting: Obstetrics and Gynecology

## 2019-05-06 ENCOUNTER — Inpatient Hospital Stay (HOSPITAL_COMMUNITY): Payer: Medicaid Other

## 2019-05-06 ENCOUNTER — Other Ambulatory Visit: Payer: Self-pay

## 2019-05-06 DIAGNOSIS — O26851 Spotting complicating pregnancy, first trimester: Secondary | ICD-10-CM | POA: Diagnosis not present

## 2019-05-06 DIAGNOSIS — O23591 Infection of other part of genital tract in pregnancy, first trimester: Secondary | ICD-10-CM | POA: Insufficient documentation

## 2019-05-06 DIAGNOSIS — K5900 Constipation, unspecified: Secondary | ICD-10-CM | POA: Insufficient documentation

## 2019-05-06 DIAGNOSIS — O99611 Diseases of the digestive system complicating pregnancy, first trimester: Secondary | ICD-10-CM

## 2019-05-06 DIAGNOSIS — N760 Acute vaginitis: Secondary | ICD-10-CM | POA: Diagnosis not present

## 2019-05-06 DIAGNOSIS — B9689 Other specified bacterial agents as the cause of diseases classified elsewhere: Secondary | ICD-10-CM | POA: Diagnosis not present

## 2019-05-06 DIAGNOSIS — N939 Abnormal uterine and vaginal bleeding, unspecified: Secondary | ICD-10-CM | POA: Diagnosis not present

## 2019-05-06 DIAGNOSIS — O26891 Other specified pregnancy related conditions, first trimester: Secondary | ICD-10-CM

## 2019-05-06 DIAGNOSIS — Z3491 Encounter for supervision of normal pregnancy, unspecified, first trimester: Secondary | ICD-10-CM

## 2019-05-06 DIAGNOSIS — M549 Dorsalgia, unspecified: Secondary | ICD-10-CM | POA: Diagnosis not present

## 2019-05-06 DIAGNOSIS — Z3A08 8 weeks gestation of pregnancy: Secondary | ICD-10-CM | POA: Insufficient documentation

## 2019-05-06 LAB — URINALYSIS, ROUTINE W REFLEX MICROSCOPIC
Bilirubin Urine: NEGATIVE
Glucose, UA: NEGATIVE mg/dL
Ketones, ur: NEGATIVE mg/dL
Leukocytes,Ua: NEGATIVE
Nitrite: NEGATIVE
Protein, ur: NEGATIVE mg/dL
Specific Gravity, Urine: 1.005 (ref 1.005–1.030)
pH: 7 (ref 5.0–8.0)

## 2019-05-06 LAB — CBC
HCT: 31.9 % — ABNORMAL LOW (ref 36.0–46.0)
Hemoglobin: 11.2 g/dL — ABNORMAL LOW (ref 12.0–15.0)
MCH: 32.5 pg (ref 26.0–34.0)
MCHC: 35.1 g/dL (ref 30.0–36.0)
MCV: 92.5 fL (ref 80.0–100.0)
Platelets: 207 10*3/uL (ref 150–400)
RBC: 3.45 MIL/uL — ABNORMAL LOW (ref 3.87–5.11)
RDW: 10.9 % — ABNORMAL LOW (ref 11.5–15.5)
WBC: 3.8 10*3/uL — ABNORMAL LOW (ref 4.0–10.5)
nRBC: 0 % (ref 0.0–0.2)

## 2019-05-06 LAB — WET PREP, GENITAL
Sperm: NONE SEEN
Trich, Wet Prep: NONE SEEN
Yeast Wet Prep HPF POC: NONE SEEN

## 2019-05-06 LAB — HCG, QUANTITATIVE, PREGNANCY: hCG, Beta Chain, Quant, S: 256442 m[IU]/mL — ABNORMAL HIGH (ref <5)

## 2019-05-06 MED ORDER — METRONIDAZOLE 500 MG PO TABS: 500.0000 mg | ORAL_TABLET | Freq: Two times a day (BID) | ORAL | 0 refills | Status: DC

## 2019-05-06 MED ORDER — DOCUSATE SODIUM 100 MG PO CAPS: 100.0000 mg | ORAL_CAPSULE | Freq: Two times a day (BID) | ORAL | 1 refills | Status: DC | PRN

## 2019-05-06 NOTE — MAU Note (Signed)
Shari Davidson is a 28 y.o. at [redacted]w[redacted]d here in MAU reporting: when using the bathroom today she noticed some spotting. Has only noticed it one time but also has not been back to the bathroom but has not seen any bleeding in her underwear. Has been really constipated so unsure if related to that. Having some lower back pressure. Had sex yesterday.  Onset of complaint: today  Pain score: 8/10  Vitals:   05/06/19 1649  BP: (!) 110/58  Pulse: 95  Resp: 15  Temp: 98.4 F (36.9 C)  SpO2: 98%     Lab orders placed from triage: UA

## 2019-05-06 NOTE — MAU Provider Note (Signed)
History     CSN: [id number]  Arrival date and time: 05/06/19 1620   First Provider Initiated Contact with Patient 05/06/19 1728      Chief Complaint  Patient presents with  . Back Pain  . Vaginal Bleeding   Shari Davidson is a 28 y.o. G2P0 at [redacted]w[redacted]d who presents to MAU with complaints of vaginal spotting and back pain. She reports symptoms started occurring yesterday. She reports vaginal spotting occurs when she wipes - most recent IC yesterday. Denies passing clots or needing to wear pad for spotting. She reports back pain is a constant ache - rates pain 7/10- has not taken any medication for pain. She reports being constipated prior to pregnancy and usually has to take Miralax, reports straining yesterday prior to bleeding started and "just wanted to check on baby".    OB History    Gravida  2   Para  0   Term      Preterm      AB  1   Living  0     SAB  1   TAB      Ectopic      Multiple      Live Births              Past Medical History:  Diagnosis Date  . Asthma   . Chlamydia 08/06/11   treated at Urgent Care  . Gonorrhea 04/20/12   treated at Urgent Care  . Migraine   . Trichomonas contact, treated     Past Surgical History:  Procedure Laterality Date  . NO PAST SURGERIES      Family History  Problem Relation Age of Onset  . Hypertension Other   . Healthy Mother   . Healthy Father     Social History   Tobacco Use  . Smoking status: Former Smoker    Packs/day: 0.30    Types: Cigarettes    Quit date: 08/30/2014    Years since quitting: 4.6  . Smokeless tobacco: Never Used  Substance Use Topics  . Alcohol use: Not Currently    Comment: OCCASIONAL  . Drug use: No    Allergies: No Known Allergies  No medications prior to admission.    Review of Systems  Constitutional: Negative.   Respiratory: Negative.   Cardiovascular: Negative.   Gastrointestinal: Positive for constipation. Negative for abdominal pain, nausea and  vomiting.  Genitourinary: Positive for vaginal bleeding. Negative for difficulty urinating, dysuria, frequency, hematuria, pelvic pain and urgency.  Musculoskeletal: Positive for back pain.  Neurological: Negative.    Physical Exam   Blood pressure (!) 110/58, pulse 95, temperature 98.4 F (36.9 C), temperature source Oral, resp. rate 15, height 5\' 4"  (1.626 m), weight 62.4 kg, last menstrual period 03/10/2019, SpO2 98 %.  Physical Exam  Nursing note and vitals reviewed. Constitutional: She is oriented to person, place, and time. She appears well-developed and well-nourished. No distress.  Cardiovascular: Normal rate and regular rhythm.  Respiratory: Effort normal and breath sounds normal. No respiratory distress. She has no wheezes.  GI: Soft. She exhibits no distension. There is no abdominal tenderness. There is no rebound and no guarding.  Genitourinary:    Vaginal bleeding present.  There is bleeding in the vagina.    Genitourinary Comments: Pelvic exam: Cervix pink, visually closed, without lesion, scant bright red mucous bleeding coming from cervical os, vaginal walls and external genitalia normal Bimanual exam: Cervix 0/long/high, firm, anterior, neg CMT, uterus nontender, nonenlarged, adnexa without  tenderness, enlargement, or mass   Musculoskeletal: Normal range of motion.        General: No edema.  Neurological: She is alert and oriented to person, place, and time.  Psychiatric: She has a normal mood and affect. Her behavior is normal. Thought content normal.    MAU Course  Procedures  MDM Orders Placed This Encounter  Procedures  . Wet prep, genital  . US OB LESS THAN 14 WEEKS WITH OB TRANSVAGINAL  . Urinalysis, Routine w reflex microscopic  . CBC  . hCG, quantitative, pregnancy   Labs and [place] report reviewed:  Results for orders placed or performed during the hospital encounter of 05/06/19 (from the past 48 hour(s))  Urinalysis, Routine w reflex microscopic      Status: Abnormal   Collection Time: 05/06/19  5:04 PM  Result Value Ref Range   Color, Urine YELLOW YELLOW   APPearance CLEAR CLEAR   Specific Gravity, Urine 1.005 1.005 - 1.030   pH 7.0 5.0 - 8.0   Glucose, UA NEGATIVE NEGATIVE mg/dL   Hgb urine dipstick SMALL (A) NEGATIVE   Bilirubin Urine NEGATIVE NEGATIVE   Ketones, ur NEGATIVE NEGATIVE mg/dL   Protein, ur NEGATIVE NEGATIVE mg/dL   Nitrite NEGATIVE NEGATIVE   Leukocytes,Ua NEGATIVE NEGATIVE   RBC / HPF 0-5 0 - 5 RBC/hpf   WBC, UA 0-5 0 - 5 WBC/hpf   Bacteria, UA RARE (A) NONE SEEN   Squamous Epithelial / LPF 0-5 0 - 5    Comment: Performed at [facility], [address], [place], [place] [address]  Wet prep, genital     Status: Abnormal   Collection Time: 05/06/19  5:45 PM   Specimen: Cervix  Result Value Ref Range   Yeast Wet Prep HPF POC NONE SEEN NONE SEEN   Trich, Wet Prep NONE SEEN NONE SEEN   Clue Cells Wet Prep HPF POC PRESENT (A) NONE SEEN   WBC, Wet Prep HPF POC FEW (A) NONE SEEN   Sperm NONE SEEN     Comment: Performed at [facility], [place] [address] [place], [place] [address]  CBC     Status: Abnormal   Collection Time: 05/06/19  6:07 PM  Result Value Ref Range   WBC 3.8 (L) 4.0 - 10.5 K/uL   RBC 3.45 (L) 3.87 - 5.11 MIL/uL   Hemoglobin 11.2 (L) 12.0 - 15.0 g/dL   HCT 31.9 (L) 36.0 - 46.0 %   MCV 92.5 80.0 - 100.0 fL   MCH 32.5 26.0 - 34.0 pg   MCHC 35.1 30.0 - 36.0 g/dL   RDW 10.9 (L) 11.5 - 15.5 %   Platelets 207 150 - 400 K/uL   nRBC 0.0 0.0 - 0.2 %    Comment: Performed at [facility], [place] [address], [place], [place] [address]  hCG, quantitative, pregnancy     Status: Abnormal   Collection Time: 05/06/19  6:07 PM  Result Value Ref Range   hCG, Beta Chain, Quant, S [phone] (H) <5 mIU/mL    Comment:          GEST. AGE      CONC.  (mIU/mL)   <=1 WEEK        5 - 50     2 WEEKS       50 - 500     3 WEEKS       100 - 10,000     4 WEEKS     1,000 -  30,000     5 WEEKS     3,500 -  115,000   6-8 WEEKS     12,000 - 270,000    12 WEEKS     15,000 - 220,000        FEMALE AND NON-PREGNANT FEMALE:     LESS THAN 5 mIU/mL Performed at [facility], [address] [place], [place] [id number]    [place] Ob Comp Less 14 Wks  Result Date: 05/06/2019 CLINICAL DATA:  Vaginal spotting EXAM: OBSTETRIC <14 WK ULTRASOUND TECHNIQUE: Transabdominal ultrasound was performed for evaluation of the gestation as well as the maternal uterus and adnexal regions. COMPARISON:  None. FINDINGS: Intrauterine gestational sac: Single Yolk sac:  Visualized. Embryo:  Visualized. Cardiac Activity: Visualized. Heart Rate: 168 bpm CRL:   23.35 mm   9 w 0 d                  US EDC: 12/09/2019 Subchorionic hemorrhage:  None visualized. Maternal uterus/adnexae: The ovaries appear normal. The right ovary measures 2.6 x 2.0 x 2.07 cm. The left ovary measures 3.7 x 3.0 x 1.4 cm. IMPRESSION: Single live intrauterine pregnancy measuring 9 weeks 0 days. Electronically Signed   By: Jonna ClarkBindu  Avutu M.D.   On: 05/06/2019 19:15   Discussed results of US and labs with patient. EDD changed based on US today to 12/09/2019. Wet prep noted clue cells- will treat for BV based on physical examination. Rx for Flagyl sent to pharmacy of choice in addition to colace for constipation.  Discussed reasons to return to MAU. Follow up as scheduled in the office. Return to MAU as needed. Pt stable at time of discharge.   Assessment and Plan   1. Normal IUP (intrauterine pregnancy) on prenatal ultrasound, first trimester   2. Vaginal spotting   3. Constipation during pregnancy in first trimester   4. Back pain during pregnancy in first trimester   5. Bacterial vaginosis    Discharge home Follow up as scheduled in the office for [facility]- [facility]  Return to MAU as needed for reasons discussed and/or emergencies  Rx for flagyl and colace     Allergies as of 05/06/2019   No Known Allergies     Medication List    TAKE these  medications   docusate sodium 100 MG capsule Commonly known as: COLACE Take 1 capsule (100 mg total) by mouth 2 (two) times daily as needed.   metroNIDAZOLE 500 MG tablet Commonly known as: FLAGYL Take 1 tablet (500 mg total) by mouth 2 (two) times daily.       Sharyon CableVeronica C Arn Mcomber CNM 05/06/2019, 7:45 PM

## 2019-05-08 LAB — GC/CHLAMYDIA PROBE AMP (~~LOC~~) NOT AT ARMC
Chlamydia: NEGATIVE
Comment: NEGATIVE
Comment: NORMAL
Neisseria Gonorrhea: NEGATIVE

## 2019-05-09 ENCOUNTER — Other Ambulatory Visit: Payer: Self-pay

## 2019-05-09 ENCOUNTER — Encounter (HOSPITAL_COMMUNITY): Payer: Self-pay | Admitting: *Deleted

## 2019-05-09 ENCOUNTER — Inpatient Hospital Stay (HOSPITAL_COMMUNITY)
Admission: AD | Admit: 2019-05-09 | Discharge: 2019-05-10 | Disposition: A | Discharge status: Home / Self Care | Payer: Medicaid Other | Attending: Family Medicine | Admitting: Family Medicine

## 2019-05-09 DIAGNOSIS — B9689 Other specified bacterial agents as the cause of diseases classified elsewhere: Secondary | ICD-10-CM

## 2019-05-09 DIAGNOSIS — O219 Vomiting of pregnancy, unspecified: Secondary | ICD-10-CM

## 2019-05-09 DIAGNOSIS — O210 Mild hyperemesis gravidarum: Secondary | ICD-10-CM | POA: Insufficient documentation

## 2019-05-09 DIAGNOSIS — Z3A09 9 weeks gestation of pregnancy: Secondary | ICD-10-CM | POA: Insufficient documentation

## 2019-05-09 DIAGNOSIS — Z87891 Personal history of nicotine dependence: Secondary | ICD-10-CM | POA: Insufficient documentation

## 2019-05-09 MED ORDER — ONDANSETRON 4 MG PO TBDP
4.0000 mg | ORAL_TABLET | Freq: Once | ORAL | Status: AC
  Administered 2019-05-10: 4 mg via ORAL
  Filled 2019-05-09: qty 1

## 2019-05-09 NOTE — MAU Note (Signed)
Pt presents to MAU stating she was evaluated on Monday for pink discharge pt was diagnosed with BV and was given flagyl. Pt states she started taking the medication on Monday and since then has been unable to keep food or drink down. Pt states she has vomited x3 in the last 24 hours. Pt reports no appetite. Pt is also c/o only voiding drops at a time and having pressure when she urinates. No back pain. Pt has brown spotting yesterday but thinks it was related to sundays exam.

## 2019-05-10 DIAGNOSIS — Z3A09 9 weeks gestation of pregnancy: Secondary | ICD-10-CM | POA: Diagnosis not present

## 2019-05-10 DIAGNOSIS — O210 Mild hyperemesis gravidarum: Secondary | ICD-10-CM | POA: Diagnosis not present

## 2019-05-10 DIAGNOSIS — O219 Vomiting of pregnancy, unspecified: Secondary | ICD-10-CM | POA: Diagnosis not present

## 2019-05-10 DIAGNOSIS — Z87891 Personal history of nicotine dependence: Secondary | ICD-10-CM | POA: Diagnosis not present

## 2019-05-10 LAB — COMPREHENSIVE METABOLIC PANEL
ALT: 18 U/L (ref 0–44)
AST: 16 U/L (ref 15–41)
Albumin: 3.9 g/dL (ref 3.5–5.0)
Alkaline Phosphatase: 40 U/L (ref 38–126)
Anion gap: 8 (ref 5–15)
BUN: 7 mg/dL (ref 6–20)
CO2: 22 mmol/L (ref 22–32)
Calcium: 9.5 mg/dL (ref 8.9–10.3)
Chloride: 104 mmol/L (ref 98–111)
Creatinine, Ser: 0.72 mg/dL (ref 0.44–1.00)
GFR calc Af Amer: >60 mL/min (ref >60)
GFR calc non Af Amer: >60 mL/min (ref >60)
Glucose, Bld: 98 mg/dL (ref 70–99)
Potassium: 4.1 mmol/L (ref 3.5–5.1)
Sodium: 134 mmol/L — ABNORMAL LOW (ref 135–145)
Total Bilirubin: 0.5 mg/dL (ref 0.3–1.2)
Total Protein: 6.8 g/dL (ref 6.5–8.1)

## 2019-05-10 LAB — CBC
HCT: 31 % — ABNORMAL LOW (ref 36.0–46.0)
Hemoglobin: 11 g/dL — ABNORMAL LOW (ref 12.0–15.0)
MCH: 32.7 pg (ref 26.0–34.0)
MCHC: 35.5 g/dL (ref 30.0–36.0)
MCV: 92.3 fL (ref 80.0–100.0)
Platelets: 205 10*3/uL (ref 150–400)
RBC: 3.36 MIL/uL — ABNORMAL LOW (ref 3.87–5.11)
RDW: 10.9 % — ABNORMAL LOW (ref 11.5–15.5)
WBC: 4.9 10*3/uL (ref 4.0–10.5)
nRBC: 0 % (ref 0.0–0.2)

## 2019-05-10 LAB — URINALYSIS, ROUTINE W REFLEX MICROSCOPIC
Bilirubin Urine: NEGATIVE
Glucose, UA: NEGATIVE mg/dL
Hgb urine dipstick: NEGATIVE
Ketones, ur: NEGATIVE mg/dL
Leukocytes,Ua: NEGATIVE
Nitrite: NEGATIVE
Protein, ur: NEGATIVE mg/dL
Specific Gravity, Urine: 1.02 (ref 1.005–1.030)
pH: 5 (ref 5.0–8.0)

## 2019-05-10 MED ORDER — ONDANSETRON 4 MG PO TBDP: 4.0000 mg | ORAL_TABLET | Freq: Three times a day (TID) | ORAL | 0 refills | Status: DC | PRN

## 2019-05-10 MED ORDER — METRONIDAZOLE 0.75 % VA GEL: 1.0000 | Freq: Every day | VAGINAL | 0 refills | Status: AC

## 2019-05-10 NOTE — Discharge Instructions (Signed)
Morning Sickness  Morning sickness is when you feel sick to your stomach (nauseous) during pregnancy. You may feel sick to your stomach and throw up (vomit). You may feel sick in the morning, but you can feel this way at any time of day. Some women feel very sick to their stomach and cannot stop throwing up (hyperemesis gravidarum). Follow these instructions at home: Medicines  Take over-the-counter and prescription medicines only as told by your doctor. Do not take any medicines until you talk with your doctor about them first.  Taking multivitamins before getting pregnant can stop or lessen the harshness of morning sickness. Eating and drinking  Eat dry toast or crackers before getting out of bed.  Eat 5 or 6 small meals a day.  Eat dry and bland foods like rice and baked potatoes.  Do not eat greasy, fatty, or spicy foods.  Have someone cook for you if the smell of food causes you to feel sick or throw up.  If you feel sick to your stomach after taking prenatal vitamins, take them at night or with a snack.  Eat protein when you need a snack. Nuts, yogurt, and cheese are good choices.  Drink fluids throughout the day.  Try ginger ale made with real ginger, ginger tea made from fresh grated ginger, or ginger candies. General instructions  Do not use any products that have nicotine or tobacco in them, such as cigarettes and e-cigarettes. If you need help quitting, ask your doctor.  Use an air purifier to keep the air in your house free of smells.  Get lots of fresh air.  Try to avoid smells that make you feel sick.  Try: ? Wearing a bracelet that is used for seasickness (acupressure wristband). ? Going to a doctor who puts thin needles into certain body points (acupuncture) to improve how you feel. Contact a doctor if:  You need medicine to feel better.  You feel dizzy or light-headed.  You are losing weight. Get help right away if:  You feel very sick to your  stomach and cannot stop throwing up.  You pass out (faint).  You have very bad pain in your belly. Summary  Morning sickness is when you feel sick to your stomach (nauseous) during pregnancy.  You may feel sick in the morning, but you can feel this way at any time of day.  Making some changes to what you eat may help your symptoms go away. This information is not intended to replace advice given to you by your health care provider. Make sure you discuss any questions you have with your health care provider. Document Released: 07/23/2004 Document Revised: 05/28/2017 Document Reviewed: 07/16/2016 Elsevier Patient Education  2020 [employer]  

## 2019-05-10 NOTE — MAU Provider Note (Addendum)
History     CSN: [id number]  Arrival date and time: 05/09/19 2302   First Provider Initiated Contact with Patient 05/09/19 2351      Chief Complaint  Patient presents with  . Vomiting   HPI  Shari Davidson is a 28 yo G2P0010 at 9.4 EGA who is presenting to MAU complaining of increased nausea and vomiting. She says that she had mild nausea before she was prescribed Flagyl for BV at her last visit, but the Flagyl made it worse. The last time she was able to eat or drink anything was at [place] (chicken soup), which immediately came back up. She has been able to tolerate small sips of water, but not anything else. She reports at least 3 episodes of vomiting in the last 24 hours, and has a diminished appetite.  OB History    Gravida  2   Para  0   Term      Preterm      AB  1   Living  0     SAB  1   TAB      Ectopic      Multiple      Live Births              Past Medical History:  Diagnosis Date  . Asthma   . Chlamydia 08/06/11   treated at Urgent Care  . Gonorrhea 04/20/12   treated at Urgent Care  . Migraine   . Trichomonas contact, treated     Past Surgical History:  Procedure Laterality Date  . NO PAST SURGERIES      Family History  Problem Relation Age of Onset  . Hypertension Other   . Healthy Mother   . Healthy Father     Social History   Tobacco Use  . Smoking status: Former Smoker    Packs/day: 0.30    Types: Cigarettes    Quit date: 08/30/2014    Years since quitting: 4.6  . Smokeless tobacco: Never Used  Substance Use Topics  . Alcohol use: Not Currently    Comment: OCCASIONAL  . Drug use: No    Allergies: No Known Allergies  Medications Prior to Admission  Medication Sig Dispense Refill Last Dose  . metroNIDAZOLE (FLAGYL) 500 MG tablet Take 1 tablet (500 mg total) by mouth 2 (two) times daily. 14 tablet 0 05/09/2019 at Unknown time  . Prenatal Vit-Fe Fumarate-FA (MULTIVITAMIN-PRENATAL) 27-0.8 MG TABS tablet Take 1 tablet by  mouth daily at 12 noon.   Past Week at Unknown time  . docusate sodium (COLACE) 100 MG capsule Take 1 capsule (100 mg total) by mouth 2 (two) times daily as needed. 30 capsule 1     Review of Systems  Constitutional: Positive for appetite change. Negative for chills, fatigue and fever.  HENT: Negative.   Eyes: Negative.   Respiratory: Negative.   Cardiovascular: Negative.   Gastrointestinal: Positive for nausea and vomiting.  Endocrine: Negative.   Genitourinary: Positive for vaginal discharge (some spotting from her MAU exam a few days ago, light brown/red, happened yesterday, no recurrence).  Musculoskeletal: Negative.   Skin: Negative.   Allergic/Immunologic: Negative.   Neurological: Negative.   Hematological: Negative.   Psychiatric/Behavioral: Negative.    Physical Exam   Blood pressure 115/66, pulse 99, temperature 99.1 F (37.3 C), temperature source Oral, resp. rate 16, weight 61.7 kg, last menstrual period 03/10/2019.  Physical Exam  Nursing note and vitals reviewed. Constitutional: She is oriented to person, place, and  time. She appears well-developed and well-nourished.  HENT:  Head: Normocephalic and atraumatic.  Eyes: Pupils are equal, round, and reactive to light. Conjunctivae and EOM are normal.  Neck: Normal range of motion. Neck supple.  Cardiovascular: Normal rate, regular rhythm, normal heart sounds and intact distal pulses.  Respiratory: Effort normal and breath sounds normal.  GI: Soft. Bowel sounds are normal. She exhibits no distension and no mass. There is no abdominal tenderness. There is no rebound and no guarding.  Musculoskeletal: Normal range of motion.  Neurological: She is alert and oriented to person, place, and time. She has normal reflexes.  Skin: Skin is warm and dry.  Psychiatric: She has a normal mood and affect. Her behavior is normal. Judgment and thought content normal.    MAU Course  Procedures  MDM -CMP, CBC, UA -ODT Zofran,  then PO challenge  Results for orders placed or performed during the hospital encounter of 05/09/19 (from the past 24 hour(s))  Urinalysis, Routine w reflex microscopic     Status: Abnormal   Collection Time: 05/09/19 11:27 PM  Result Value Ref Range   Color, Urine YELLOW YELLOW   APPearance HAZY (A) CLEAR   Specific Gravity, Urine 1.020 1.005 - 1.030   pH 5.0 5.0 - 8.0   Glucose, UA NEGATIVE NEGATIVE mg/dL   Hgb urine dipstick NEGATIVE NEGATIVE   Bilirubin Urine NEGATIVE NEGATIVE   Ketones, ur NEGATIVE NEGATIVE mg/dL   Protein, ur NEGATIVE NEGATIVE mg/dL   Nitrite NEGATIVE NEGATIVE   Leukocytes,Ua NEGATIVE NEGATIVE  Comprehensive metabolic panel     Status: Abnormal   Collection Time: 05/10/19 12:18 AM  Result Value Ref Range   Sodium 134 (L) 135 - 145 mmol/L   Potassium 4.1 3.5 - 5.1 mmol/L   Chloride 104 98 - 111 mmol/L   CO2 22 22 - 32 mmol/L   Glucose, Bld 98 70 - 99 mg/dL   BUN 7 6 - 20 mg/dL   Creatinine, Ser 1.74 0.44 - 1.00 mg/dL   Calcium 9.5 8.9 - 08.1 mg/dL   Total Protein 6.8 6.5 - 8.1 g/dL   Albumin 3.9 3.5 - 5.0 g/dL   AST 16 15 - 41 U/L   ALT 18 0 - 44 U/L   Alkaline Phosphatase 40 38 - 126 U/L   Total Bilirubin 0.5 0.3 - 1.2 mg/dL   GFR calc non Af Amer >60 >60 mL/min   GFR calc Af Amer >60 >60 mL/min   Anion gap 8 5 - 15  CBC     Status: Abnormal   Collection Time: 05/10/19 12:18 AM  Result Value Ref Range   WBC 4.9 4.0 - 10.5 K/uL   RBC 3.36 (L) 3.87 - 5.11 MIL/uL   Hemoglobin 11.0 (L) 12.0 - 15.0 g/dL   HCT 44.8 (L) 18.5 - 63.1 %   MCV 92.3 80.0 - 100.0 fL   MCH 32.7 26.0 - 34.0 pg   MCHC 35.5 30.0 - 36.0 g/dL   RDW 49.7 (L) 02.6 - 37.8 %   Platelets 205 150 - 400 K/uL   nRBC 0.0 0.0 - 0.2 %   Recheck: -Nausea much improved -Able to tolerate juice and graham crackers -Wants to DC home Assessment and Plan  28 yo G2P0010 at 9.4 EGA with worsening nausea and vomiting -Changed PO Flagyl to Metrogel for patient preference -Responded well to  Zofran, rx sent -Follow up at new OB visit as scheduled -Return precautions given  Keren Alverio L Janneth Krasner 05/10/2019, 1:02 AM

## 2019-05-22 ENCOUNTER — Ambulatory Visit (INDEPENDENT_AMBULATORY_CARE_PROVIDER_SITE_OTHER): Payer: Medicaid Other | Admitting: *Deleted

## 2019-05-22 ENCOUNTER — Other Ambulatory Visit: Payer: Self-pay

## 2019-05-22 VITALS — Ht 64.0 in

## 2019-05-22 DIAGNOSIS — Z3400 Encounter for supervision of normal first pregnancy, unspecified trimester: Secondary | ICD-10-CM

## 2019-05-22 DIAGNOSIS — Z3480 Encounter for supervision of other normal pregnancy, unspecified trimester: Secondary | ICD-10-CM | POA: Insufficient documentation

## 2019-05-22 MED ORDER — BLOOD PRESSURE MONITOR AUTOMAT DEVI: 1.0000 | Freq: Every day | 0 refills | Status: DC

## 2019-05-22 NOTE — Progress Notes (Signed)
   Virtual Visit via Telephone Note  I connected with [name] on 05/22/19 at  1:30 PM EST by telephone and verified that I am speaking with the correct person using two identifiers.  Location: Patient: Shari Davidson MRN: [id number] Provider: Derl Barrow, RN   I discussed the limitations, risks, security and privacy concerns of performing an evaluation and management service by telephone and the availability of in person appointments. I also discussed with the patient that there may be a patient responsible charge related to this service. The patient expressed understanding and agreed to proceed.   History of Present Illness: PRENATAL INTAKE SUMMARY  Shari Davidson presents today New OB Nurse Interview.  OB History    Gravida  2   Para  0   Term      Preterm      AB  1   Living  0     SAB  1   TAB      Ectopic      Multiple      Live Births             I have reviewed the patient's medical, obstetrical, social, and family histories, medications, and available lab results.  SUBJECTIVE She has no unusual complaints   Observations/Objective: Initial nurse interview for history/labs (New OB)  EDD: 12/09/2019 by early ultrasound GA: [redacted]w[redacted]d G2P0010 FHT: non face to face interview  GENERAL APPEARANCE: non face to face interview  Assessment and Plan: Normal pregnancy Prenatal care [facility] Labs/physical at next visit with midwife Pt to sign up for Babyscipts Rx for BP cuff sent to [facility] Continue PNV  Follow Up Instructions:   I discussed the assessment and treatment plan with the patient. The patient was provided an opportunity to ask questions and all were answered. The patient agreed with the plan and demonstrated an understanding of the instructions.   The patient was advised to call back or seek an in-person evaluation if the symptoms worsen or if the condition fails to improve as anticipated.  I provided 10  minutes of non-face-to-face time during this encounter.   Derl Barrow, RN

## 2019-06-14 ENCOUNTER — Other Ambulatory Visit: Payer: Self-pay

## 2019-06-14 ENCOUNTER — Encounter: Payer: Medicaid Other | Admitting: Advanced Practice Midwife

## 2019-06-14 ENCOUNTER — Encounter: Payer: Self-pay | Admitting: Medical

## 2019-06-14 ENCOUNTER — Other Ambulatory Visit (HOSPITAL_COMMUNITY)
Admission: RE | Admit: 2019-06-14 | Discharge: 2019-06-14 | Disposition: A | Discharge status: Home / Self Care | Payer: Medicaid Other | Source: Ambulatory Visit | Attending: Medical | Admitting: Medical

## 2019-06-14 ENCOUNTER — Ambulatory Visit (INDEPENDENT_AMBULATORY_CARE_PROVIDER_SITE_OTHER): Payer: Medicaid Other | Admitting: Medical

## 2019-06-14 ENCOUNTER — Encounter: Payer: Self-pay | Admitting: *Deleted

## 2019-06-14 VITALS — BP 107/52 | HR 74 | Wt 133.4 lb

## 2019-06-14 DIAGNOSIS — J45909 Unspecified asthma, uncomplicated: Secondary | ICD-10-CM | POA: Diagnosis not present

## 2019-06-14 DIAGNOSIS — Z3A14 14 weeks gestation of pregnancy: Secondary | ICD-10-CM

## 2019-06-14 DIAGNOSIS — O26892 Other specified pregnancy related conditions, second trimester: Secondary | ICD-10-CM

## 2019-06-14 DIAGNOSIS — Z23 Encounter for immunization: Secondary | ICD-10-CM

## 2019-06-14 DIAGNOSIS — G43809 Other migraine, not intractable, without status migrainosus: Secondary | ICD-10-CM | POA: Diagnosis not present

## 2019-06-14 DIAGNOSIS — Z3400 Encounter for supervision of normal first pregnancy, unspecified trimester: Secondary | ICD-10-CM | POA: Insufficient documentation

## 2019-06-14 DIAGNOSIS — Z3689 Encounter for other specified antenatal screening: Secondary | ICD-10-CM

## 2019-06-14 DIAGNOSIS — O219 Vomiting of pregnancy, unspecified: Secondary | ICD-10-CM | POA: Diagnosis not present

## 2019-06-14 MED ORDER — METOCLOPRAMIDE HCL 10 MG PO TABS: 10.0000 mg | ORAL_TABLET | Freq: Three times a day (TID) | ORAL | 2 refills | Status: DC | PRN

## 2019-06-14 NOTE — Progress Notes (Signed)
   PRENATAL VISIT NOTE  Subjective:  Shari Davidson is a 28 y.o. G2P0010 at [redacted]w[redacted]d being seen today for her first prenatal visit for this pregnancy.  She is currently monitored for the following issues for this low-risk pregnancy and has Migraine headache; MYOPIA; RHINITIS, ALLERGIC; ASTHMA, INTERMITTENT; CONSTIPATION; Depression, major, recurrent, moderate ([place]); and Supervision of normal first pregnancy, antepartum on their problem list.  Patient reports nausea and vomiting.  Contractions: Not present. Vag. Bleeding: None.  Movement: Absent. Denies leaking of fluid.   She is planning to breastfeed. Desires Depo Provera for contraception.   The following portions of the patient's history were reviewed and updated as appropriate: allergies, current medications, past family history, past medical history, past social history, past surgical history and problem list.   Objective:   Vitals:   06/14/19 1045  BP: (!) 107/52  Pulse: 74  Weight: 133 lb 6.4 oz (60.5 kg)    Fetal Status: Fetal Heart Rate (bpm): 147   Movement: Absent     General:  Alert, oriented and cooperative. Patient is in no acute distress.  Skin: Skin is warm and dry. No rash noted.   Cardiovascular: Normal heart rate and rhythm noted  Respiratory: Normal respiratory effort, no problems with respiration noted. Clear to auscultation.   Abdomen: Soft, gravid, appropriate for gestational age. Normal bowel sounds. Non-tender. Pain/Pressure: Absent     Pelvic: Cervical exam performed Dilation: Closed Effacement (%): Thick   Normal cervical contour, no lesions, no bleeding following pap, normal discharge  Extremities: Normal range of motion.  Edema: None  Mental Status: Normal mood and affect. Normal behavior. Normal judgment and thought content.   Assessment and Plan:  Pregnancy: G2P0010 at [redacted]w[redacted]d 1. Supervision of normal first pregnancy, antepartum - Cytology - PAP( [place]) - Culture, OB Urine - Flu Vaccine  QUAD 36+ mos IM - Genetic Screening - [facility]or[facility] and Horizon  - Obstetric Panel, Including HIV - Hemoglobin A1c - [place] MFM OB DETAIL +14 WK; scheduled - Has BP cuff  2. Mild asthma without complication, unspecified whether persistent - Only occasional need for inhaler - [place] MFM OB DETAIL +14 WK; Future  3. Other migraine without status migrainosus, not intractable - No recent issues   4. Encounter for fetal anatomic survey - [place] MFM OB DETAIL +14 WK; scheduled   5. Nausea and vomiting in pregnancy prior to [redacted] weeks gestation - Zofran was controlling symptoms but causing significant constipation  - Discussed changing anti-emetics vs adding laxatives, patient agrees to change anti-emetics  - metoCLOPramide (REGLAN) 10 MG tablet; Take 1 tablet (10 mg total) by mouth every 8 (eight) hours as needed for nausea.  Dispense: 30 tablet; Refill: 2  Discussed the normal cadence of prenatal visits Discussed the nature of our practice with multiple providers, students and residents   Preterm labor/ second trimester warning symptoms and general obstetric precautions including but not limited to vaginal bleeding, contractions, leaking of fluid and fetal movement were reviewed in detail with the patient. Please refer to After Visit Summary for other counseling recommendations.   Return in about 4 weeks (around 07/12/2019) for LOB, Virtual.  Future Appointments  Date Time Provider [place]  07/12/2019  1:15 PM Danielle Rankin [place]  07/14/2019  1:00 PM [place] [facility]  07/14/2019  1:00 PM [place] 3 WH-[facility]    Kerry Hough, PA-C

## 2019-06-14 NOTE — Patient Instructions (Signed)
Childbirth Education Options: [facility] Department Classes:  Childbirth education classes can help you get ready for a positive parenting experience. You can also meet other expectant parents and get free stuff for your baby. Each class runs for five weeks on the same night and costs $45 for the mother-to-be and her support person. Medicaid covers the cost if you are eligible. Call [phone] to register. [facility] Childbirth Education:  [phone] or [phone] or [email or web address]  Baby & Me Class: Discuss newborn & infant parenting and family adjustment issues with other new mothers in a relaxed environment. Each week brings a new speaker or baby-centered activity. We encourage new mothers to join [place] every Thursday at 11:00am. Babies birth until crawling. No registration or fee. Daddy [employer]: This course offers Dads-to-be the tools and knowledge needed to feel confident on their journey to becoming new fathers. Experienced dads, who have been trained as coaches, teach dads-to-be how to hold, comfort, diaper, swaddle and play with their infant while being able to support the new mom as well. A class for men taught by men. $25/dad Big Brother/Big Sister: Let your children share in the joy of a new brother or sister in this special class designed just for them. Class includes discussion about how families care for babies: swaddling, holding, diapering, safety as well as how they can be helpful in their new role. This class is designed for children ages 45 to 48, but any age is welcome. Please register each child individually. $5/child  Mom Talk: This mom-led group offers support and connection to mothers as they journey through the adjustments and struggles of that sometimes overwhelming first year after the birth of a child. Tuesdays at 10:00am and Thursdays at 6:00pm. Babies welcome. No registration or fee. Breastfeeding Support Group: This group is a mother-to-mother  support circle where moms have the opportunity to share their breastfeeding experiences. A Lactation Consultant is present for questions and concerns. Meets each Tuesday at 11:00am. No fee or registration. Breastfeeding Your Baby: Learn what to expect in the first days of breastfeeding your newborn.  This class will help you feel more confident with the skills needed to begin your breastfeeding experience. Many new mothers are concerned about breastfeeding after leaving the hospital. This class will also address the most common fears and challenges about breastfeeding during the first few weeks, months and beyond. (call for fee) Comfort Techniques and Tour: This 2 hour interactive class will provide you the opportunity to learn & practice hands-on techniques that can help relieve some of the discomfort of labor and encourage your baby to rotate toward the best position for birth. You and your partner will be able to try a variety of labor positions with birth balls and rebozos as well as practice breathing, relaxation, and visualization techniques. A tour of the [facility] is included with this class. $20 per registrant and support person Childbirth Class- Weekend Option: This class is a Weekend version of our Birth & Baby series. It is designed for parents who have a difficult time fitting several weeks of classes into their schedule. It covers the care of your newborn and the basics of labor and childbirth. It also includes a [place] [facility] and lunch. The class is held two consecutive days: beginning on Friday evening from 6:30 - 8:30 p.m. and the next day, Saturday from 9 a.m. - 4 p.m. (call for fee) Doren Custard Class: Interested in a waterbirth?  This  informational class will help you discover whether waterbirth is the right fit for you. Education about waterbirth itself, supplies you would need and how to assemble your support team is what you can  expect from this class. Some obstetrical practices require this class in order to pursue a waterbirth. (Not all obstetrical practices offer waterbirth-check with your healthcare provider.) Register only the expectant mom, but you are encouraged to bring your partner to class! Required if planning waterbirth, no fee. Infant/Child CPR: Parents, grandparents, babysitters, and friends learn Cardio-Pulmonary Resuscitation skills for infants and children. You will also learn how to treat both conscious and unconscious choking in infants and children. This Family & Friends program does not offer certification. Register each participant individually to ensure that enough mannequins are available. (Call for fee) Grandparent Love: Expecting a grandbaby? This class is for you! Learn about the latest infant care and safety recommendations and ways to support your own child as he or she transitions into the parenting role. Taught by Registered Nurses who are childbirth instructors, but most importantly...they are grandmothers too! $10/person. Childbirth Class- Natural Childbirth: This series of 5 weekly classes is for expectant parents who want to learn and practice natural methods of coping with the process of labor and childbirth. Relaxation, breathing, massage, visualization, role of the partner, and helpful positioning are highlighted. Participants learn how to be confident in their body's ability to give birth. This class will empower and help parents make informed decisions about their own care. Includes discussion that will help new parents transition into the immediate postpartum period. Maternity Care Center Tour of [facility] is included. We suggest taking this class between 25-32 weeks, but it's only a recommendation. $75 per registrant and one support person or $30 Medicaid. Childbirth Class- 3 week Series: This option of 3 weekly classes helps you and your labor partner prepare for childbirth. Newborn  care, labor & birth, cesarean birth, pain management, and comfort techniques are discussed and a Maternity Care Center Tour of [facility] is included. The class meets at the same time, on the same day of the week for 3 consecutive weeks beginning with the starting date you choose. $60 for registrant and one support person.  Marvelous Multiples: Expecting twins, triplets, or more? This class covers the differences in labor, birth, parenting, and breastfeeding issues that face multiples' parents. NICU tour is included. Led by a Certified Childbirth Educator who is the mother of twins. No fee. Caring for Baby: This class is for expectant and adoptive parents who want to learn and practice the most up-to-date newborn care for their babies. Focus is on birth through the first six weeks of life. Topics include feeding, bathing, diapering, crying, umbilical cord care, circumcision care and safe sleep. Parents learn to recognize symptoms of illness and when to call the pediatrician. Register only the mom-to-be and your partner or support person can plan to come with you! $10 per registrant and support person Childbirth Class- online option: This online class offers you the freedom to complete a Birth and Baby series in the comfort of your own home. The flexibility of this option allows you to review sections at your own pace, at times convenient to you and your support people. It includes additional video information, animations, quizzes, and extended activities. Get organized with helpful eClass tools, checklists, and trackers. Once you register online for the class, you will receive an email within a few days to accept the invitation and begin the class when the time   is right for you. The content will be available to you for 60 days. $60 for 60 days of online access for you and your support people.  Local Doulas: Natural Baby Doulas [email or web address] Tel:  [phone] [email or web address] [employer] [phone] [email or web address] The Labor Hassell Halim  (also do waterbirth tub rental) [phone] [email or web address] Triad Birth Doula [phone] [email or web address] NotebookDistributors.fi Sacred Rhythms  [phone] https://sacred-rhythms.com/ [employer] (PADA) [email or web address] https://www.[email or web address]/ [place] Birth and Baby  http://[email or web address]/ Considering Waterbirth? Guide for patients at [facility] [employer]  Why consider waterbirth?  . Gentle birth for babies . Less pain medicine used in labor . May allow for passive descent/less pushing . May reduce perineal tears  . More mobility and instinctive maternal position changes . Increased maternal relaxation . Reduced blood pressure in labor  Is waterbirth safe? What are the risks of infection, drowning or other complications?  . Infection: o Very low risk (3.7 % for tub vs 4.8% for bed) o 7 in 8000 waterbirths with documented infection o Poorly cleaned equipment most common cause o Slightly lower group B strep transmission rate  . Drowning o Maternal:  - Very low risk   - Related to seizures or fainting o Newborn:  - Very low risk. No evidence of increased risk of respiratory problems in multiple large studies - Physiological protection from breathing under water - Avoid underwater birth if there are any fetal complications - Once baby's head is out of the water, keep it out.  . Birth complication o Some reports of cord trauma, but risk decreased by bringing baby to surface gradually o No evidence of increased risk of shoulder dystocia. Mothers can usually change positions faster in water than in a bed, possibly aiding the maneuvers to free the shoulder.   You must attend a Doren Custard class at [facility]  3rd Wednesday of every month from 7-9pm  [employer] by calling [phone] or online at [email or web address]  Bring [place] the certificate from the class to your prenatal appointment  Meet with a midwife at 36 weeks to see if you can still plan a waterbirth and to sign the consent.   Purchase or rent the following supplies:   Water Birth Pool (Birth Pool in a Box or [place] for instance)  (Tubs start ~$125)  Single-use disposable tub liner designed for your brand of tub  New garden hose labeled "lead-free", "suitable for drinking water",  Electric drain pump to remove water (We recommend 792 gallon per hour or greater pump.)   Separate garden hose to remove the dirty water  Fish net  Bathing suit top (optional)  Long-handled mirror (optional)  Places to purchase or rent supplies  GotWebTools.is for tub purchases and supplies  Waterbirthsolutions.com for tub purchases and supplies  The Labor Ladies (www.[email or web address]) $275 for tub rental/set-up & take down/kit   [employer] (http://www.fleming.com/.htm) Information regarding doulas (labor support) who provide pool rentals  Our practice has a Birth Pool in a Box tub at the hospital that you may borrow on a first-come-first-served basis. It is your responsibility to to set up, clean and break down the tub. We cannot guarantee the availability of this tub in advance. You are responsible for bringing all accessories listed above. If you do not have all necessary supplies you cannot have a waterbirth.    Things that would prevent you from having a waterbirth:  Premature, <37wks  Previous cesarean birth  Presence of thick meconium-stained fluid  Multiple gestation (Twins,  triplets, etc.)  Uncontrolled diabetes or gestational diabetes requiring medication  Hypertension requiring medication or diagnosis of pre-eclampsia  Heavy vaginal bleeding  Non-reassuring fetal  heart rate  Active infection (MRSA, etc.). Group B Strep is NOT a contraindication for  waterbirth.  If your labor has to be induced and induction method requires continuous  monitoring of the baby's heart rate  Other risks/issues identified by your obstetrical provider  Please remember that birth is unpredictable. Under certain unforeseeable circumstances your provider may advise against giving birth in the tub. These decisions will be made on a case-by-case basis and with the safety of you and your baby as our highest priority.  Safe Medications in Pregnancy   Acne:  Benzoyl Peroxide  Salicylic Acid   Backache/Headache:  Tylenol: 2 regular strength every 4 hours OR        2 Extra strength every 6 hours   Colds/Coughs/Allergies:  Benadryl (alcohol free) 25 mg every 6 hours as needed  Breath right strips  Claritin  Cepacol throat lozenges  Chloraseptic throat spray  Cold-Eeze- up to three times per day  Cough drops, alcohol free  Flonase (by prescription only)  Guaifenesin  Mucinex  Robitussin DM (plain only, alcohol free)  Saline nasal spray/drops  Sudafed (pseudoephedrine) & Actifed * use only after [redacted] weeks gestation and if you do not have high blood pressure  Tylenol  Vicks Vaporub  Zinc lozenges  Zyrtec   Constipation:  Colace  Ducolax suppositories  Fleet enema  Glycerin suppositories  Metamucil  Milk of magnesia  Miralax  Senokot  Smooth move tea   Diarrhea:  Kaopectate  Imodium A-D   *NO pepto Bismol   Hemorrhoids:  Anusol  Anusol HC  Preparation H  Tucks   Indigestion:  Tums  Maalox  Mylanta  Zantac  Pepcid   Insomnia:  Benadryl (alcohol free) 38m every 6 hours as needed  Tylenol PM  Unisom, no Gelcaps   Leg Cramps:  Tums  MagGel   Nausea/Vomiting:  Bonine  Dramamine  Emetrol  Ginger extract  Sea bands  Meclizine  Nausea medication to take during pregnancy:  Unisom (doxylamine succinate 25 mg tablets) Take one  tablet daily at bedtime. If symptoms are not adequately controlled, the dose can be increased to a maximum recommended dose of two tablets daily (1/2 tablet in the morning, 1/2 tablet mid-afternoon and one at bedtime).  Vitamin B6 1025mtablets. Take one tablet twice a day (up to 200 mg per day).   Skin Rashes:  Aveeno products  Benadryl cream or 2566mvery 6 hours as needed  Calamine Lotion  1% cortisone cream   Yeast infection:  Gyne-lotrimin 7  Monistat 7    **If taking multiple medications, please check labels to avoid duplicating the same active ingredients  **take medication as directed on the label  ** Do not exceed 4000 mg of tylenol in 24 hours  **Do not take medications that contain aspirin or ibuprofen         AREA PEDIATRIC/FAMILY PRACTICE PHYSICIANS  Central/Southeast  [phone] [facility] o CDavy PiqueD; EniGwendlyn DeutscherD; HalWalker KehrD; HenAndria FramesD; McDiarmid, MD; McIDutch QuintD; NeaNori RiisD; [place] AmberD WakitaGreLorettoC [phone]-Fri 8:30-12:30, 1:30-5:00 o Providers come to see babies at [facility] . EagBrainards BraBowieoviders who accept newborns: KoiDorthy CoolerD; MorOrland MustardD; WolStephanie AcreD o [address] [place], [place]  [address] o [phone] o Mon-Fri 8:00-5:30 o Babies seen by providers at [facility] o Does NOT accept Medicaid o Please call early in hospitalization for appointment (limited availability)  . Mustard Whatley, MD o [address], [place], [place] [address] o [phone] o Mon, Tue, Thur, Fri 8:30-5:00, Wed 10:00-7:00 (closed 1-2pm) o Babies seen by [facility] providers o Accepting Medicaid . [place], MD o [place], [place], [place] [address] o [phone] o Mon-Fri 8:30-5:00, Sat 8:30-12:00 o Provider comes to see babies at  [place] Medicaid o Must have been referred from current patients or contacted office prior to delivery . [place] [facility] ([place] for [place]) Franne Forts, MD; Tamera Punt, MD; Doneen Poisson, MD; Fatima Sanger, MD; Wynetta Emery, MD; Jess Barters, MD; Tami Ribas, MD; Herbert Moors, MD; Derrell Lolling, MD; Dorothyann Peng, MD; Lucious Groves, NP; Baldo Ash, NP o [address], [place], [place] [address] o [phone] o Mon, Tue, Thur, Fri 8:30-5:30, Wed 9:30-5:30, Sat 8:30-12:30 o Babies seen by [facility] providers o Accepting Medicaid o Only accepting infants of first-time parents or siblings of current patients [facility] discharge coordinator will make follow-up appointment . Baltazar Najjar o [place] [address], [place], [place]  [address] o [phone]   Fax - [phone] . [facility] o [address], [address], [place], [place]  [address] o Phone - [phone]   Fax [phone] . [place], [place], [place], [place]  [address] o [phone]  [place] [phone]) . [facility] Reginal Lutes, MD; Jacklynn Ganong, MD; Torrie Mayers, MD; MD; Rosana Hoes, MD; Servando Salina, MD; Rose Fillers, MD; Rex Kras, MD; Corinna Capra, MD; Volney American, MD; Trilby Drummer, MD; Janann Colonel, MD; Jimmye Norman, [place], [place], [place] [address] o [phone] o Mon-Fri 8:30-5:00 (extended evenings Mon-Thur as needed), Sat-Sun 10:00-1:00 o Providers come to see babies at [place] Medicaid for families of first-time babies and families with all children in the household age 63 and under. Must register with office prior to making appointment (M-F only). . [place], NP; Tomi Bamberger, MD; Redmond School, MD; [place], [place] [address] [place], [place] [address] o [phone] o Mon-Fri 8:00-5:00 o Babies seen by providers at [facility] o Does NOT accept Medicaid/Commercial Insurance Only . [facility] Pediatric[facility] at  [place] ([facility])  Marnee Guarneri, MD; Drema Dallas, MD; Montine Circle, MD; Vilma Prader, MD; Vanita Panda, MD; Alfonso Ramus, MD; Ruthann Cancer, MD; Roxanne Mins, MD; Rosalva Ferron, MD; Polly Cobia, MD o [place] [place], [place] [address] o [phone] o Mon-Fri 8:30-5:30, Sat (Oct.-Mar.) 9:00-1:00 o Babies seen by providers at [place] [phone]) . [facility] Peers, MD; Suzan Slick, MD o [place] [address], [place], [place] [address] o [phone] o Mon-Fri 8:30-5:00, Sat 8:30-12:00 o Providers come to see babies at [facility] o Does NOT accept Medicaid . [place] at [place], [place]; [place], MD; [place], [place]; Nancy Fetter, MD; Moreen Fowler, [place], [place], [place] [address] o [phone] o Mon-Fri 8:00-5:00 o Babies seen by providers at [facility] o Does NOT accept Medicaid o Only accepting babies of parents who are patients o Please call early in hospitalization for appointment (limited availability) . [facility] Pediatricians Blanca Friend, MD; Sharlene Motts, MD; Rod Can, MD; Warner Mccreedy, NP; Sabra Heck, MD; Ermalinda Memos, MD; Sharlett Iles, NP; Aurther Loft, MD; Jerrye Beavers, MD; Marcello Moores, MD; Berline Lopes, MD;  [place], MD o [place]. [place], [place], [place] [address] o [phone] o Mon-Fri 8:00-5:00, Sat 9:00-12:00 o Providers come to see babies at [facility] o Does NOT accept [facility] [phone]) . [place] at [place] providers accepting new patients: Dayna Ramus, NP; [place], [place], [place], [place] [address] o [phone] o Mon-Fri 8:00-5:00 o Babies seen by providers at [facility] o Does NOT accept Medicaid o Only accepting babies of parents who are patients o Please call early in hospitalization for appointment (limited availability) . [facility] Oswaldo Conroy, MD; Sheran Lawless, MD o [address] [place], [place] [address] o [phone] (press 1 to  schedule appointment) o Mon-Fri 8:00-5:00 o Providers come to see babies at [facility] o Does NOT accept Medicaid . [facility] Jodi Mourning, MD o [address] [place], [place] [address] o [phone] o Mon-Fri 8:30-5:00 (lunch 12:30-1:00), extended hours by appointment only Wed 5:00-6:30 o Babies seen by [facility] providers o Accepting Medicaid . [place] at Evalyn Casco, MD; [place], MD; Ethlyn Gallery, MD o [place], [place], [place] [address] o [phone] o Mon-Fri 8:00-5:00 o Babies seen by [facility] providers o Does NOT accept Medicaid . Therapist, music at [place], MD; Yong Channel, MD; [place], [place]., [place], [place] [address] o [phone] o Mon-Fri 8:00-5:00 o Babies seen by [facility] providers o Does NOT accept Medicaid . [place], [place]; [place], [place]; [place], NP; Albertina Parr, MD; Frederic Jericho, MD; Ronney Lion, MD; Carlos Levering, NP; Jerelene Redden, NP; Tomasita Crumble, NP; Ronelle Nigh, NP; Corinna Lines, MD; Stebb[place], MD o [address] [place], [place] [address] o [phone] o Mon-Fri 8:30-5:00, Sat 10:00-1:00 o Providers come to see babies at [facility] o Does NOT accept Medicaid o Free prenatal information session Tuesdays at 4:45pm . [facility] Porfirio Oar, MD; [place], [place]; [place], [place]; [place], [place]., [place] [address] o [phone] o Mon-Fri 7:30-5:30 o Babies seen by [facility] providers . [facility] o [address], [place], [place], [place]  [address] o [phone]   Fax - [phone]  [place] [phone] & [phone]) . [place] o [address] [place], [place] [address] o [phone] o Mon-Thur 8:00-6:00 o Providers come to see babies at [place] Medicaid . [place], NP; Melford Aase, MD; [place], [place]; [place], [place] [place], [place] [address] o [phone] o Mon-Thur 7:30-7:30, Fri 7:30-4:30 o Babies seen by [facility] providers o Accepting Medicaid . [facility] Nyra Jabs, MD; Cristino Martes, NP; Gertie Baron, MD o [place] [address], [place], [place] [address] o [phone] o Mon-Fri 8:30-5:00, Sat 8:30-12:00 o Providers come to see babies at [place] Medicaid o Must have "Meet & Greet" appointment at office prior to delivery . [place] ([place]) Jodene Nam, MD; Juleen China, MD; Clydene Laming, [address], [place], [place] [address] o [phone] o Mon-Wed 8:00-6:00, Thur-Fri 8:00-5:00, Sat 9:00-12:00 o Providers come to see babies at [facility] o Does NOT accept Medicaid o Only accepting siblings of current patients . [facility] [place], [place], [place], [place]  [address] o [phone]   Fax [phone] . [place] at [place] [address], [place], [place]  [place]   Fax - [place] [phone] & [phone]) . Therapist, music at [place], DO; [place], [address] [place], [place] [address] o [phone] o Mon-Fri 7:00-5:00 o Babies seen by [facility] providers o Does NOT accept Medicaid . [place], MD; [place], [place]; [place], [place], [place], [place] [address] o [phone] o Mon-Fri 8:00-5:00 o Babies seen by [facility] providers o Accepting Medicaid . [place], MD; [place], [place]; [place], NP; [place], [place], [place], [place] [address] o [phone] o Mon-Fri 8:00-5:00 o Babies seen by providers at [place] [phone]) . [place]  [facility] at [place], [place] o [place]., [place], [place] [address] o [phone] o Mon-Fri 8:00-5:00 o Babies seen by [facility] providers o Does NOT accept Medicaid o Limited availability, please call early in hospitalization to schedule follow-up . [facility] Leilani Merl, PA; Maisie Fus, MD; [place], MD; [place], [place]; Jeannine Kitten, MD; [place], [place] [facility] [address], [place], [place] [address] o [phone] o Mon-Fri 8:30-5:00, Sat 9:00-12:00 o Babies seen by providers at [facility] o Accepting Medicaid o Please register online then schedule online or call office o [email or web address] . [place] ([place] at [place]) Kristian Covey, NP; Dwyane Dee, MD; Leonidas Romberg, PA o [address] [place], [place], [place] [address] o [phone] o Mon-Fri 8:00-5:00 o Babies seen by providers at [place] [facility] o Accepting Medicaid . [place] ([facility] [employer]) Dairl Ponder, MD; Rayvon Char, NP; Melina Modena, MD o [address] [place], [place], [place] [address] o [phone] o Mon-Fri 8:00-5:30, Sat&Sun by appointment (phones open at 8:30) o Babies seen by [facility] providers o Accepting Medicaid o Must be a first-time baby or sibling of current patient . [address], [address], [place], [place]  [address] o [phone]   Fax - [phone]  [place] [phone] & [phone]) . [place], [place]; [place], [place]; Benjamine Mola, MD; [place], [place]; Harrell Lark, MD o [address], [place], [place] [address] o [phone] o Mon-Thur 8:00-7:00, Fri 8:00-5:00, Sat 8:00-12:00, Sun 9:00-12:00 o Babies seen by [facility] providers o Accepting Medicaid . [facility], MD; Ruthann Cancer, MD; [facility], MD o [address], [place], [place]  [address] o [phone] o Mon-Thur 8:00-5:00 o Babies seen by providers at [facility] o Accepting Medicaid . [facility] [place], MD; Coe-Goins, MD; Amedeo Plenty, MD; Bobby Rumpf, MD; List, MD; Lavonia Drafts, MD; Ruthann Cancer, MD; Selinda Eon, MD; Audie Box, MD; Jim Like, MD; Christie Nottingham, MD; Hubbard Hartshorn, MD; Modena Nunnery, MD o [address] [place], [place] [address] o [phone] o Mon-Fri 8:00-5:30, Sat (Oct.-Mar.) 9:00-1:00 o Babies seen by providers at [facility] o Accepting Medicaid o Must fill out new patient packet, available online at [email or web address] . [place] ([facility]) Barnabas Lister, NP; Kenton Kingfisher, NP; Claiborne Billings, NP; Rolla Plate, MD; [place], [place]; Carola Rhine, MD; Tyron Russell, MD; Delia Chimes, NP o [address], [place],  [address] o [phone] o Mon-Thur 8:00-5:30, Fri 8:00-5:00 o Babies seen by providers at [facility] o Accepting Medicaid  [employer] [phone]) . [place], [place]; [place], MD; Dennard Schaumann, MD; [place], [place] o [address] [place], [place] [address] o [phone] o Mon-Fri 8:00-5:00 o Babies seen by providers at [place] [phone]) . [place] at [place], [place]; Olen Pel, MD; [place], [place], [place], [place] [address] o [phone] o Mon-Fri 8:00-5:00 o Babies seen by providers at [facility] o Does NOT accept Medicaid o Limited appointment availability, please call early in hospitalization  . Therapist, music at [place], [place]; [place], [address], [place], [place] [address] o [phone] o Mon-Fri 8:00-5:00 o Babies seen by [facility] providers o Does NOT accept Medicaid . [facility], MD; Guy Sandifer, MD; [place], [place]; [place], [place] [address], [place], [place]  [address] o [phone] o Mon-Fri 8:00-5:00 o After hours clinic [facility] [place], [place] [address]) [phone] Mon-Fri 5:00-8:00, Sat 12:00-6:00, Sun 10:00-4:00 o Babies seen by [facility] providers o Accepting Medicaid . [place] at [facility] o [address] [place], [place]  [address] o [phone]   Fax - [phone]  [place] [phone]) . Therapist, music at [facility], MD o [address] [place], [place], [place] [address] o [phone] o Mon-Fri 8:00-5:00 o Babies seen by [facility] providers o Does NOT accept Medicaid . [place] ([place] [facility] [place]) Bing Neighbors, MD o [address] [place], [place], [place] [address] o [phone] o Mon-Thur 8:00-7:00, Fri 8:00-5:00, Sat 8:00-12:00 o Babies seen by providers at [facility] o Accepting Medicaid - but does not have vaccinations in office (must be received elsewhere) o Limited availability, please call early in hospitalization  [place] ([id number]) . [place], [place] [address], [place] [address] o [phone]  Fax [phone]

## 2019-06-15 ENCOUNTER — Encounter: Payer: Self-pay | Admitting: General Practice

## 2019-06-15 LAB — OBSTETRIC PANEL, INCLUDING HIV
Antibody Screen: NEGATIVE
Basophils Absolute: 0 10*3/uL (ref 0.0–0.2)
Basos: 0 %
EOS (ABSOLUTE): 0.1 10*3/uL (ref 0.0–0.4)
Eos: 1 %
HIV Screen 4th Generation wRfx: NONREACTIVE
Hematocrit: 32 % — ABNORMAL LOW (ref 34.0–46.6)
Hemoglobin: 10.7 g/dL — ABNORMAL LOW (ref 11.1–15.9)
Hepatitis B Surface Ag: NEGATIVE
Immature Grans (Abs): 0 10*3/uL (ref 0.0–0.1)
Immature Granulocytes: 0 %
Lymphocytes Absolute: 1.4 10*3/uL (ref 0.7–3.1)
Lymphs: 24 %
MCH: 32.2 pg (ref 26.6–33.0)
MCHC: 33.4 g/dL (ref 31.5–35.7)
MCV: 96 fL (ref 79–97)
Monocytes Absolute: 0.5 10*3/uL (ref 0.1–0.9)
Monocytes: 9 %
Neutrophils Absolute: 3.8 10*3/uL (ref 1.4–7.0)
Neutrophils: 66 %
Platelets: 240 10*3/uL (ref 150–450)
RBC: 3.32 x10E6/uL — ABNORMAL LOW (ref 3.77–5.28)
RDW: 12.6 % (ref 11.7–15.4)
RPR Ser Ql: NONREACTIVE
Rh Factor: POSITIVE
Rubella Antibodies, IGG: 1.87 index (ref >0.99)
WBC: 5.8 10*3/uL (ref 3.4–10.8)

## 2019-06-15 LAB — HEMOGLOBIN A1C
Est. average glucose Bld gHb Est-mCnc: 108 mg/dL
Hgb A1c MFr Bld: 5.4 % (ref 4.8–5.6)

## 2019-06-15 LAB — CYTOLOGY - PAP
Chlamydia: NEGATIVE
Comment: NEGATIVE
Comment: NORMAL
Diagnosis: NEGATIVE
Neisseria Gonorrhea: POSITIVE — AB

## 2019-06-16 LAB — URINE CULTURE, OB REFLEX

## 2019-06-16 LAB — CULTURE, OB URINE

## 2019-06-20 ENCOUNTER — Other Ambulatory Visit: Payer: Self-pay

## 2019-06-20 ENCOUNTER — Ambulatory Visit (INDEPENDENT_AMBULATORY_CARE_PROVIDER_SITE_OTHER): Payer: Medicaid Other | Admitting: General Practice

## 2019-06-20 VITALS — BP 105/69 | HR 91 | Ht 64.0 in | Wt 133.0 lb

## 2019-06-20 DIAGNOSIS — Z3A15 15 weeks gestation of pregnancy: Secondary | ICD-10-CM | POA: Diagnosis not present

## 2019-06-20 DIAGNOSIS — O98212 Gonorrhea complicating pregnancy, second trimester: Secondary | ICD-10-CM | POA: Diagnosis present

## 2019-06-20 MED ORDER — AZITHROMYCIN 250 MG PO TABS
1000.0000 mg | ORAL_TABLET | Freq: Once | ORAL | Status: AC
  Administered 2019-06-20: 1000 mg via ORAL

## 2019-06-20 MED ORDER — CEFTRIAXONE SODIUM 250 MG IJ SOLR
250.0000 mg | Freq: Once | INTRAMUSCULAR | Status: AC
  Administered 2019-06-20: 12:00:00 250 mg via INTRAMUSCULAR

## 2019-06-20 NOTE — Progress Notes (Signed)
Patient seen and assessed by nursing staff during this encounter. I have reviewed the chart and agree with the documentation and plan.  Marcille Buffy DNP, CNM  06/20/19  11:38 AM

## 2019-06-20 NOTE — Progress Notes (Signed)
[place] here for Zithromax PO & Rocephin IM-- recent + gonorrhea.  Injection administered without complication & Pills given. Patient will return in January for next prenatal visit.  Derinda Late, RN 06/20/2019  11:34 AM

## 2019-07-04 ENCOUNTER — Telehealth: Payer: Self-pay | Admitting: Lactation Services

## 2019-07-04 NOTE — Telephone Encounter (Signed)
Attempted to call pt to inform her of Genetic Screening Results. Pt did not answer and was not given an option to leave a message. Will send My Chart Message to pt.

## 2019-07-12 ENCOUNTER — Telehealth (INDEPENDENT_AMBULATORY_CARE_PROVIDER_SITE_OTHER): Payer: Medicaid Other | Admitting: Medical

## 2019-07-12 ENCOUNTER — Encounter: Payer: Self-pay | Admitting: Medical

## 2019-07-12 VITALS — BP 112/60 | HR 83

## 2019-07-12 DIAGNOSIS — K5901 Slow transit constipation: Secondary | ICD-10-CM

## 2019-07-12 DIAGNOSIS — O26892 Other specified pregnancy related conditions, second trimester: Secondary | ICD-10-CM

## 2019-07-12 DIAGNOSIS — Z3A18 18 weeks gestation of pregnancy: Secondary | ICD-10-CM | POA: Diagnosis not present

## 2019-07-12 DIAGNOSIS — Z3400 Encounter for supervision of normal first pregnancy, unspecified trimester: Secondary | ICD-10-CM

## 2019-07-12 NOTE — Progress Notes (Signed)
I connected with Shari Davidson on 07/12/19 at  1:15 PM EST by: MyChart and verified that I am speaking with the correct person using two identifiers.  Patient is located at home and provider is located at [facility].     The purpose of this virtual visit is to provide medical care while limiting exposure to the novel coronavirus. I discussed the limitations, risks, security and privacy concerns of performing an evaluation and management service by MyChart and the availability of in person appointments. I also discussed with the patient that there may be a patient responsible charge related to this service. By engaging in this virtual visit, you consent to the provision of healthcare.  Additionally, you authorize for your insurance to be billed for the services provided during this visit.  The patient expressed understanding and agreed to proceed.  The following staff members participated in the virtual visit:  Marylynn Pearson, RN    PRENATAL VISIT NOTE  Subjective:  Shari Davidson is a 29 y.o. G2P0010 at [redacted]w[redacted]d  for phone visit for ongoing prenatal care.  She is currently monitored for the following issues for this low-risk pregnancy and has Migraine headache; MYOPIA; RHINITIS, ALLERGIC; ASTHMA, INTERMITTENT; Constipation; Depression, major, recurrent, moderate (HCC); and Supervision of normal first pregnancy, antepartum on their problem list.  Patient reports no complaints.  Contractions: Not present. Vag. Bleeding: None.  Movement: Present. Denies leaking of fluid.   The following portions of the patient's history were reviewed and updated as appropriate: allergies, current medications, past family history, past medical history, past social history, past surgical history and problem list.   Objective:   Vitals:   07/12/19 1335  BP: 112/60  Pulse: 83   Self-Obtained  Fetal Status:     Movement: Present     Assessment and Plan:  Pregnancy: G2P0010 at [redacted]w[redacted]d 1.  Supervision of normal first pregnancy, antepartum - Doing well - Anatomy US scheduled 07/14/19 - AFP, Serum, Open Spina Bifida; to be drawn after [place] appt 1/15  2. Slow transit constipation  Preterm labor/second trimester warning symptoms and general obstetric precautions including but not limited to vaginal bleeding, contractions, leaking of fluid and fetal movement were reviewed in detail with the patient.  Return in about 4 weeks (around 08/09/2019) for LOB, Virtual.  Future Appointments  Date Time Provider Department Center  07/14/2019  1:00 PM WH-MFC NURSE WH-[facility]FC M[facility]-US  07/14/2019  1:00 PM WH-MFC [place] 3 WH-MFCUS MFC-US     Time spent on virtual visit: 10 minutes  Vonzella Nipple, PA-C

## 2019-07-12 NOTE — Patient Instructions (Addendum)
Second Trimester of Pregnancy  The second trimester is from week 14 through week 27 (month 4 through 6). This is often the time in pregnancy that you feel your best. Often times, morning sickness has lessened or quit. You may have more energy, and you may get hungry more often. Your unborn baby is growing rapidly. At the end of the sixth month, he or she is about 9 inches long and weighs about 1 pounds. You will likely feel the baby move between 18 and 20 weeks of pregnancy. Follow these instructions at home: Medicines  Take over-the-counter and prescription medicines only as told by your doctor. Some medicines are safe and some medicines are not safe during pregnancy.  Take a prenatal vitamin that contains at least 600 micrograms (mcg) of folic acid.  If you have trouble pooping (constipation), take medicine that will make your stool soft (stool softener) if your doctor approves. Eating and drinking   Eat regular, healthy meals.  Avoid raw meat and uncooked cheese.  If you get low calcium from the food you eat, talk to your doctor about taking a daily calcium supplement.  Avoid foods that are high in fat and sugars, such as fried and sweet foods.  If you feel sick to your stomach (nauseous) or throw up (vomit): ? Eat 4 or 5 small meals a day instead of 3 large meals. ? Try eating a few soda crackers. ? Drink liquids between meals instead of during meals.  To prevent constipation: ? Eat foods that are high in fiber, like fresh fruits and vegetables, whole grains, and beans. ? Drink enough fluids to keep your pee (urine) clear or pale yellow. Activity  Exercise only as told by your doctor. Stop exercising if you start to have cramps.  Do not exercise if it is too hot, too humid, or if you are in a place of great height (high altitude).  Avoid heavy lifting.  Wear low-heeled shoes. Sit and stand up straight.  You can continue to have sex unless your doctor tells you not  to. Relieving pain and discomfort  Wear a good support bra if your breasts are tender.  Take warm water baths (sitz baths) to soothe pain or discomfort caused by hemorrhoids. Use hemorrhoid cream if your doctor approves.  Rest with your legs raised if you have leg cramps or low back pain.  If you develop puffy, bulging veins (varicose veins) in your legs: ? Wear support hose or compression stockings as told by your doctor. ? Raise (elevate) your feet for 15 minutes, 3-4 times a day. ? Limit salt in your food. Prenatal care  Write down your questions. Take them to your prenatal visits.  Keep all your prenatal visits as told by your doctor. This is important. Safety  Wear your seat belt when driving.  Make a list of emergency phone numbers, including numbers for family, friends, the hospital, and police and fire departments. General instructions  Ask your doctor about the right foods to eat or for help finding a counselor, if you need these services.  Ask your doctor about local prenatal classes. Begin classes before month 6 of your pregnancy.  Do not use hot tubs, steam rooms, or saunas.  Do not douche or use tampons or scented sanitary pads.  Do not cross your legs for long periods of time.  Visit your dentist if you have not done so. Use a soft toothbrush to brush your teeth. Floss gently.  Avoid all smoking, herbs,   and alcohol. Avoid drugs that are not approved by your doctor.  Do not use any products that contain nicotine or tobacco, such as cigarettes and e-cigarettes. If you need help quitting, ask your doctor.  Avoid cat litter boxes and soil used by cats. These carry germs that can cause birth defects in the baby and can cause a loss of your baby (miscarriage) or stillbirth. Contact a doctor if:  You have mild cramps or pressure in your lower belly.  You have pain when you pee (urinate).  You have bad smelling fluid coming from your vagina.  You continue to  feel sick to your stomach (nauseous), throw up (vomit), or have watery poop (diarrhea).  You have a nagging pain in your belly area.  You feel dizzy. Get help right away if:  You have a fever.  You are leaking fluid from your vagina.  You have spotting or bleeding from your vagina.  You have severe belly cramping or pain.  You lose or gain weight rapidly.  You have trouble catching your breath and have chest pain.  You notice sudden or extreme puffiness (swelling) of your face, hands, ankles, feet, or legs.  You have not felt the baby move in over an hour.  You have severe headaches that do not go away when you take medicine.  You have trouble seeing. Summary  The second trimester is from week 14 through week 27 (months 4 through 6). This is often the time in pregnancy that you feel your best.  To take care of yourself and your unborn baby, you will need to eat healthy meals, take medicines only if your doctor tells you to do so, and do activities that are safe for you and your baby.  Call your doctor if you get sick or if you notice anything unusual about your pregnancy. Also, call your doctor if you need help with the right food to eat, or if you want to know what activities are safe for you. This information is not intended to replace advice given to you by your health care provider. Make sure you discuss any questions you have with your health care provider. Document Revised: 10/07/2018 Document Reviewed: 07/21/2016 Elsevier Patient Education  2020 Elsevier Inc.  AREA PEDIATRIC/FAMILY PRACTICE PHYSICIANS  [place] ([address]) . [facility] o Chambliss, MD; Eniola, MD; Hale, MD; Hensel, MD; McDiarmid, MD; McIntyer, MD; Neal, MD; Walden, MD o [address] [place], [place] [address] o [phone] o Mon-Fri 8:30-12:30, 1:30-5:00 o Providers come to see babies at [facility] o Accepting Medicaid . [facility] o Limited providers who accept newborns: Koirala, MD; Morrow, MD; Wolters, MD o [address], [place], [place] [address] o [phone] o Mon-Fri 8:00-5:30 o Babies seen by providers at [facility] o Does NOT accept Medicaid o Please call early in hospitalization for appointment (limited availability)  . [facility] o Mulberry, MD o [address] [place], [place] [address] o [phone] o Mon, Tue, Thur, Fri 8:30-5:00, Wed 10:00-7:00 (closed 1-2pm) o Babies seen by [facility] providers o Accepting Medicaid . [facility] o Rubin, MD o [address], [place], [place] [address] o [phone] o Mon-Fri 8:30-5:00, Sat 8:30-12:00 o Provider comes to see babies at [facility] o Accepting Medicaid o Must have been referred from current patients or contacted office prior to delivery . [facility] (  [facility]) o Brown, MD; Chandler, MD; Ettefagh, MD; Grant, MD; Lester, MD; McCormick, MD; McQueen, MD; Prose, MD; Simha, MD; Stanley, MD; Stryffeler, NP; Tebben, NP o [address], [place], [place] [address] o [phone] o Mon, Tue, Thur, Fri 8:30-5:30, Wed 9:30-5:30, Sat 8:30-12:30 o Babies seen by [facility] providers o Accepting Medicaid o Only accepting infants of first-time parents or siblings of current patients o Hospital discharge coordinator will make follow-up appointment . Jack Amos o [address], [place], [place]  [address] o [phone]   Fax - [phone] . [facility] o [address], [place], [place]  [address] o Phone - [phone]   Fax - [phone] . Shilpa Gosrani o [address], [place], [place]  [address] o [phone]  [place] ([address]) . [facility] o Bates, MD; [place], MD; Cooper, Cox, MD; MD; Davis, MD; Dovico, MD;  Ettefaugh, MD; Little, MD; Lowe, MD; Keiffer, MD; Melvin, MD; Sumner, MD; Williams, MD o [address], [place], [place] [address] o [phone] o Mon-Fri 8:30-5:00 (extended evenings Mon-Thur as needed), Sat-Sun 10:00-1:00 o Providers come to see babies at [facility] o Accepting Medicaid for families of first-time babies and families with all children in the household age 3 and under. Must register with office prior to making appointment (M-F only). . [facility] o Henson, NP; Knapp, MD; Lalonde, MD; Tysinger, PA o [address] [place], [place] [address] o [phone] o Mon-Fri 8:00-5:00 o Babies seen by providers at [facility] o Does NOT accept Medicaid/Commercial Insurance Only . [facility] ([facility])  o Artis, MD; Barnes, MD; Bratton, MD; Coccaro, MD; [place] Gardner, MD; Kramer, MD; Marshall, MD; Netherton, MD; Poleto, MD; Skinner, MD o [address] [place], [place] [address] o [phone] o Mon-Fri 8:30-5:30, Sat (Oct.-Mar.) 9:00-1:00 o Babies seen by providers at [facility] o Accepting Medicaid  [place] ([address]) . [facility] o Reid, MD; Warner, MD o [address], [place], [place] [address] o [phone] o Mon-Fri 8:30-5:00, Sat 8:30-12:00 o Providers come to see babies at [facility] o Does NOT accept Medicaid . [facility] o Becker, PA; Hagler, MD; Scifres, PA; Sun, MD; Swayne, MD o [address], [place], [place] [address] o [phone] o Mon-Fri 8:00-5:00 o Babies seen by providers at [facility] o Does NOT accept Medicaid o Only accepting babies of parents who are patients o Please call early in hospitalization for appointment (limited availability) . [facility] o Clark, MD; Frye, MD; Kelleher, MD; Mack, NP; Miller, MD; O'Keller, MD; [place], NP; Pudlo, MD; Puzio, MD; Thomas, MD;  Tucker, MD; Twiselton, MD o [address], [place], [place] [address] o [phone] o Mon-Fri 8:00-5:00, Sat 9:00-12:00 o Providers come to see babies at [facility] o Does NOT accept Medicaid  [place] ([address]) . [facility] o Limited providers accepting new patients: Brake, NP; Wharton, PA o [address], [place], [place] [address] o [phone] o Mon-Fri 8:00-5:00 o Babies seen by providers at [facility] o Does NOT accept Medicaid o Only accepting babies of parents who are patients o Please call early in hospitalization for appointment (limited availability) . [facility] o Gay, MD; Quinlan, MD o [address] [place], [place] [address] o [phone] (press 1 to schedule appointment) o Mon-Fri 8:00-5:00 o Providers come to   see babies at [facility] o Does NOT accept Medicaid . [facility] o Mazer, MD o [address] [place], [place] [address] o [phone] o Mon-Fri 8:30-5:00 (lunch 12:30-1:00), extended hours by appointment only Wed 5:00-6:30 o Babies seen by [facility] providers o Accepting Medicaid . [facility] o Banks, MD; Jordan, MD; Koberlein, MD o [address], [place], [place] [address] o [phone] o Mon-Fri 8:00-5:00 o Babies seen by [facility] providers o Does NOT accept Medicaid . [facility] o Parker, MD; Hunter, MD; Wallace, DO o [address] [place], [place] [address] o [phone] o Mon-Fri 8:00-5:00 o Babies seen by [facility] providers o Does NOT accept Medicaid . [facility] o Brandon, PA; Brecken, PA; Christy, NP; Dees, MD; DeClaire, MD; DeWeese, MD; Hansen, NP; Mills, NP; Parrish, NP; Smoot, NP; Summer, MD; Vapne, MD o [address] [place], [place] [address] o [phone] o Mon-Fri 8:30-5:00, Sat 10:00-1:00 o Providers come to see babies at [facility] o Does NOT accept Medicaid o Free prenatal information session Tuesdays at 4:45pm . [facility] o Bouska, MD; [place], PA; Jeffery, PA; Weber, PA o [address] [place] [address] o [phone] o Mon-Fri 7:30-5:30 o Babies seen by [facility] providers . [facility] o [address], [place], [place]  [address] o [phone]   Fax - [phone]  [place] ([address]) . [facility] o [place]ese, MD o [address] [place], [place] [address] o [phone] o Mon-Thur 8:00-6:00 o Providers come to see babies at [facility] o Accepting Medicaid . [facility] o Anderson, NP; Badger, MD; Beal, PA; Spencer, PA o [address] [place], [place] [address] o [phone] o Mon-Thur 7:30-7:30, Fri 7:30-4:30 o Babies seen by [facility] providers o Accepting Medicaid . [facility] o Agbuya, MD; Klett, NP; Romgoolam, MD o [address], [place], [place] [address] o [phone] o Mon-Fri 8:30-5:00, Sat 8:30-12:00 o Providers come to see babies at [facility] o Accepting Medicaid o Must have "Meet & Greet" appointment at office prior to delivery . [facility] ([facility] o McCord, MD; Wallace, MD; Wood, MD o [address], [place], [place] [address] o [phone] o Mon-Wed 8:00-6:00, Thur-Fri 8:00-5:00, Sat 9:00-12:00 o Providers come to see babies at [facility] o Does NOT accept Medicaid o Only accepting siblings of current patients . [facility]  o [address], [place], [place]  [address] o [phone]   Fax - [phone] . [facility] o [address], [place], [place]  [address] o [phone]   Fax - [phone]  [place] ([address] & [address]) . [facility] o Cirigliano, DO; Matthews, DO o [address] [place], [place] [address] o [phone] o Mon-Fri 7:00-5:00 o Babies seen by [facility] providers o Does NOT accept Medicaid . [facility] o Briscoe, MD; Howley, PA; [place], PA o [address], [place], [place] [address] o [phone] o Mon-Fri 8:00-5:00 o Babies seen by [facility] providers o Accepting Medicaid . [facility] o Boyd, MD; Church, PA; Jones, NP; Osborn, PA o [address], [place], [place] [address] o [phone] o Mon-Fri 8:00-5:00 o Babies seen by providers at [facility] o   Accepting Medicaid  [place] ([address]) . [facility] [place] o Wendling, DO o [address] [place], [place] [address] o [phone] o Mon-Fri 8:00-5:00 o Babies seen by [facility] providers o Does NOT accept Medicaid o Limited availability, please call early in hospitalization to schedule follow-up . [facility] o Calderon, PA; Cummings, MD; Dillard, MD; Martin, PA; Olson, MD; VanDeven, PA o [address], [place], [place] [address] o [phone] o Mon-Fri 8:30-5:00, Sat 9:00-12:00 o Babies seen by providers at [facility] o Accepting Medicaid o Please register online then schedule online or call office o [email or web address] . [facility] ([facility] o Hunter, NP; Kumar, MD; Martin Rogers, PA o [address], [place], [place] [address] o [phone] o Mon-Fri 8:00-5:00 o Babies seen by providers at [facility] o Accepting Medicaid . [facility] ([facility] o Capitola, MD; Kristi Fleenor, NP; [place], MD o [address], [place], [place] [address] o [phone] o Mon-Fri 8:00-5:30, Sat&Sun by  appointment (phones open at 8:30) o Babies seen by [facility] providers o Accepting Medicaid o Must be a first-time baby or sibling of current patient . [facility] [place]  o [address], [place], [place]  [address] o [phone]   Fax - [phone]  [place] ([address]) . [facility] o Brown, PA; Cowen, PA; Rice, MD; Helton, PA; Spry, MD o [address] [place], [place] [address] o [phone] o Mon-Thur 8:00-7:00, Fri 8:00-5:00, Sat 8:00-12:00, Sun 9:00-12:00 o Babies seen by [facility] providers o Accepting Medicaid . [facility] [place] o Coe-Goins, MD; Marshall, MD; Pierre-Louis, MD o [address] [place] [address], [place], [place] [address] o [phone] o Mon-Thur 8:00-5:00 o Babies seen by providers at [facility] o Accepting Medicaid . [facility] o Bratton, MD; Coe-Goins, MD; Hayes, MD; Lewis, MD; List, MD; Lott, MD; Marshall, MD; Moran, MD; O'Neal, MD; Pierre-Louis, MD; Pitonzo, MD; Scholer, MD; Spangle, MD o [address] [place], [place] [address] o [phone] o Mon-Fri 8:00-5:30, Sat (Oct.-Mar.) 9:00-1:00 o Babies seen by providers at [facility] o Accepting Medicaid o Must fill out new patient packet, available online at [email or web address] . [facility] ([facility] o Friddle, NP; Harris, NP; Kelly, NP; Logan, MD; Melvin, PA; Poth, MD; Ramadoss, MD; Stanton, NP o [address], [place], [place] [address] o [phone] o Mon-Thur 8:00-5:30, Fri 8:00-5:00 o Babies seen by providers at [facility] o Accepting Medicaid  [place] ([address]) . [facility] o Dixon, PA; Mead, MD; Pickard, MD; Tapia, PA o [address], [place], [place] [address] o [phone] o Mon-Fri 8:00-5:00 o Babies seen  by providers at [facility] o Accepting Medicaid   [place] ([address]) . [facility] [place] o Masneri, DO; Meyers, MD; Nelson, PA o [address], [place], [place] [address] o [phone] o Mon-Fri 8:00-5:00 o Babies seen by providers at [facility] o Does NOT accept Medicaid o Limited appointment availability, please call early in hospitalization  . [facility] [place] o Kunedd, DO; McGowen, MD o [address], [place],  [address] o [phone] o   Mon-Fri 8:00-5:00 o Babies seen by [facility] providers o Does NOT accept Medicaid . [facility] [place] o Cameron, MD; MacDonald, MD; Michaels, PA; Nayak, MD o [address] [place] [address], [place], [place] [address] o [phone] o Mon-Fri 8:00-5:00 o After hours clinic ([address] [place], [place] [address]) [phone] Mon-Fri 5:00-8:00, Sat 12:00-6:00, Sun 10:00-4:00 o Babies seen by [facility] providers o Accepting Medicaid . [facility] [place] o [address], [place], [place]  [address] o [phone]   Fax - [phone]  [place] ([address]) . [facility] [place] [facility] o Andy, MD o [address], [place], [place] [address] o [phone] o Mon-Fri 8:00-5:00 o Babies seen by [facility] providers o Does NOT accept Medicaid . [facility] [place] ([facility] [place]) o Eksir, MD o [address], [place], [place] [address] o [phone] o Mon-Thur 8:00-7:00, Fri 8:00-5:00, Sat 8:00-12:00 o Babies seen by providers at [facility] o Accepting Medicaid - but does not have vaccinations in office (must be received elsewhere) o Limited availability, please call early in hospitalization  [place] ([address]) . [facility]  o Charlene Flemming, MD o [address],  [place] [address] o [phone]  Fax [phone]    

## 2019-07-12 NOTE — Progress Notes (Signed)
I connected with  Selinda Flavin on 07/12/19 at  1:15 PM EST by mychart and verified that I am speaking with the correct person using two identifiers.   I discussed the limitations, risks, security and privacy concerns of performing an evaluation and management service by telephone and the availability of in person appointments. I also discussed with the patient that there may be a patient responsible charge related to this service. The patient expressed understanding and agreed to proceed.  Marylynn Pearson, RN 07/12/2019  1:24 PM

## 2019-07-13 ENCOUNTER — Telehealth: Payer: Self-pay | Admitting: Obstetrics and Gynecology

## 2019-07-13 NOTE — Telephone Encounter (Signed)
The patient has a check out note to be seen after her u/s visit on Friday but we close at 12. Would you like me to schedule it before? Or on Monday?

## 2019-07-14 ENCOUNTER — Other Ambulatory Visit: Payer: Self-pay

## 2019-07-14 ENCOUNTER — Ambulatory Visit (HOSPITAL_COMMUNITY)
Admission: RE | Admit: 2019-07-14 | Discharge: 2019-07-14 | Disposition: A | Discharge status: Home / Self Care | Payer: Medicaid Other | Source: Ambulatory Visit | Attending: Obstetrics and Gynecology | Admitting: Obstetrics and Gynecology

## 2019-07-14 ENCOUNTER — Encounter (HOSPITAL_COMMUNITY): Payer: Self-pay

## 2019-07-14 ENCOUNTER — Other Ambulatory Visit: Payer: Self-pay | Admitting: Medical

## 2019-07-14 ENCOUNTER — Ambulatory Visit (HOSPITAL_COMMUNITY): Payer: Medicaid Other | Admitting: *Deleted

## 2019-07-14 DIAGNOSIS — J45909 Unspecified asthma, uncomplicated: Secondary | ICD-10-CM

## 2019-07-14 DIAGNOSIS — Z3A18 18 weeks gestation of pregnancy: Secondary | ICD-10-CM | POA: Diagnosis not present

## 2019-07-14 DIAGNOSIS — Z3400 Encounter for supervision of normal first pregnancy, unspecified trimester: Secondary | ICD-10-CM

## 2019-07-14 DIAGNOSIS — Z363 Encounter for antenatal screening for malformations: Secondary | ICD-10-CM

## 2019-07-14 DIAGNOSIS — Z3689 Encounter for other specified antenatal screening: Secondary | ICD-10-CM | POA: Diagnosis present

## 2019-07-14 DIAGNOSIS — O99891 Other specified diseases and conditions complicating pregnancy: Secondary | ICD-10-CM | POA: Diagnosis not present

## 2019-07-17 ENCOUNTER — Telehealth: Payer: Self-pay | Admitting: Obstetrics and Gynecology

## 2019-07-17 NOTE — Telephone Encounter (Signed)
Opened in error

## 2019-07-25 ENCOUNTER — Encounter: Payer: Self-pay | Admitting: Obstetrics and Gynecology

## 2019-07-25 DIAGNOSIS — D573 Sickle-cell trait: Secondary | ICD-10-CM | POA: Insufficient documentation

## 2019-08-09 ENCOUNTER — Telehealth (INDEPENDENT_AMBULATORY_CARE_PROVIDER_SITE_OTHER): Payer: Self-pay | Admitting: Nurse Practitioner

## 2019-08-09 DIAGNOSIS — Z3400 Encounter for supervision of normal first pregnancy, unspecified trimester: Secondary | ICD-10-CM

## 2019-08-09 DIAGNOSIS — Z3A22 22 weeks gestation of pregnancy: Secondary | ICD-10-CM

## 2019-08-09 NOTE — Progress Notes (Signed)
I connected with  Shari Davidson on 08/09/19 at  8:15 AM EST by telephone and verified that I am speaking with the correct person using two identifiers.   I discussed the limitations, risks, security and privacy concerns of performing an evaluation and management service by telephone and the availability of in person appointments. I also discussed with the patient that there may be a patient responsible charge related to this service. The patient expressed understanding and agreed to proceed.  [name], CMA 08/09/2019  8:17 AM   I connected with@ on 08/09/19 at  8:15 AM EST by: MyChart and verified that I am speaking with the correct person using two identifiers.  Patient is located at home and provider is located at [facility].     The purpose of this virtual visit is to provide medical care while limiting exposure to the novel coronavirus. I discussed the limitations, risks, security and privacy concerns of performing an evaluation and management service by Mychart and the availability of in person appointments. I also discussed with the patient that there may be a patient responsible charge related to this service. By engaging in this virtual visit, you consent to the provision of healthcare.  Additionally, you authorize for your insurance to be billed for the services provided during this visit.  The patient expressed understanding and agreed to proceed.  The following staff members participated in the virtual visit:  Marykay Lex, CMA and Nolene Bernheim, NP    PRENATAL VISIT NOTE  Subjective:  Shari Davidson is a 29 y.o. G2P0010 at [redacted]w[redacted]d  for myChart visit for ongoing prenatal care.  She is currently monitored for the following issues for this low-risk pregnancy and has Migraine headache; MYOPIA; RHINITIS, ALLERGIC; ASTHMA, INTERMITTENT; Constipation; Depression, major, recurrent, moderate (HCC); Supervision of normal first pregnancy, antepartum; and Sickle cell trait  (HCC) on their problem list.  Patient reports no complaints.  Contractions: Not present. Vag. Bleeding: None.  Movement: Present. Denies leaking of fluid.   The following portions of the patient's history were reviewed and updated as appropriate: allergies, current medications, past family history, past medical history, past social history, past surgical history and problem list.   Objective:   Vitals:   08/09/19 0818  BP: 134/68  Pulse: 90   Self-Obtained  Fetal Status:     Movement: Present     Assessment and Plan:  Pregnancy: G2P0010 at [redacted]w[redacted]d 1. Supervision of normal first pregnancy, antepartum Doing well Baby moving well. Wants AFP and has not had it drawn yet.  Will come tomorrow afternoon for lab only visit. Will return in 5 weeks for GTT and ROB Encouraged to continue taking BP weekly and entering into Babyscripts.  Reviewed Babyscripts data and BP readings look good. Encouraged to sign up for online Breastfeeding and Childbirth classes Takes stool softeners for constipation and no issues at present Discussed FOB being tested for Sickle Cell Trait.   Preterm labor symptoms and general obstetric precautions including but not limited to vaginal bleeding, contractions, leaking of fluid and fetal movement were reviewed in detail with the patient.  Return in about 1 day (around 08/10/2019) for Lab only appointment thursday afternoon and ROB in person in 5 weeks early AM for fasting GTT.  No future appointments.   Time spent on virtual visit: 8 minutes  Currie Paris, NP

## 2019-08-10 ENCOUNTER — Other Ambulatory Visit: Payer: Medicaid Other

## 2019-08-10 ENCOUNTER — Other Ambulatory Visit: Payer: Self-pay

## 2019-08-10 DIAGNOSIS — Z3400 Encounter for supervision of normal first pregnancy, unspecified trimester: Secondary | ICD-10-CM

## 2019-08-12 LAB — AFP, SERUM, OPEN SPINA BIFIDA
AFP MoM: 1.71
AFP Value: 157.7 ng/mL
Gest. Age on Collection Date: 22.5 weeks
Maternal Age At EDD: 28.2 yr
OSBR Risk 1 IN: 3181
Test Results:: NEGATIVE
Weight: 138 [lb_av]

## 2019-09-07 ENCOUNTER — Other Ambulatory Visit: Payer: Self-pay

## 2019-09-07 DIAGNOSIS — Z3400 Encounter for supervision of normal first pregnancy, unspecified trimester: Secondary | ICD-10-CM

## 2019-09-13 ENCOUNTER — Other Ambulatory Visit: Payer: Medicaid Other

## 2019-09-13 ENCOUNTER — Ambulatory Visit (INDEPENDENT_AMBULATORY_CARE_PROVIDER_SITE_OTHER): Payer: Medicaid Other | Admitting: Family Medicine

## 2019-09-13 ENCOUNTER — Other Ambulatory Visit: Payer: Self-pay

## 2019-09-13 ENCOUNTER — Other Ambulatory Visit (HOSPITAL_COMMUNITY)
Admission: RE | Admit: 2019-09-13 | Discharge: 2019-09-13 | Disposition: A | Discharge status: Home / Self Care | Payer: Medicaid Other | Source: Ambulatory Visit | Attending: Family Medicine | Admitting: Family Medicine

## 2019-09-13 VITALS — BP 109/65 | HR 89 | Wt 143.0 lb

## 2019-09-13 DIAGNOSIS — Z3400 Encounter for supervision of normal first pregnancy, unspecified trimester: Secondary | ICD-10-CM

## 2019-09-13 DIAGNOSIS — D573 Sickle-cell trait: Secondary | ICD-10-CM | POA: Diagnosis not present

## 2019-09-13 DIAGNOSIS — Z23 Encounter for immunization: Secondary | ICD-10-CM | POA: Diagnosis not present

## 2019-09-13 DIAGNOSIS — D509 Iron deficiency anemia, unspecified: Secondary | ICD-10-CM

## 2019-09-13 DIAGNOSIS — O99012 Anemia complicating pregnancy, second trimester: Secondary | ICD-10-CM | POA: Diagnosis not present

## 2019-09-13 DIAGNOSIS — N898 Other specified noninflammatory disorders of vagina: Secondary | ICD-10-CM | POA: Diagnosis not present

## 2019-09-13 DIAGNOSIS — Z3483 Encounter for supervision of other normal pregnancy, third trimester: Secondary | ICD-10-CM

## 2019-09-13 DIAGNOSIS — Z3A27 27 weeks gestation of pregnancy: Secondary | ICD-10-CM

## 2019-09-13 DIAGNOSIS — O99013 Anemia complicating pregnancy, third trimester: Secondary | ICD-10-CM

## 2019-09-13 NOTE — Progress Notes (Signed)
   PRENATAL VISIT NOTE  Subjective:  Shari Davidson is a 29 y.o. G2P0010 at [redacted]w[redacted]d being seen today for ongoing prenatal care.  She is currently monitored for the following issues for this low-risk pregnancy and has Migraine headache; MYOPIA; RHINITIS, ALLERGIC; ASTHMA, INTERMITTENT; Constipation; Depression, major, recurrent, moderate (HCC); Supervision of normal first pregnancy, antepartum; and Sickle cell trait (HCC) on their problem list.  Patient reports no complaints and vaginal discharge, white.  Contractions: Not present. Vag. Bleeding: None.  Movement: Present. Denies leaking of fluid.   The following portions of the patient's history were reviewed and updated as appropriate: allergies, current medications, past family history, past medical history, past social history, past surgical history and problem list.   Objective:   Vitals:   09/13/19 0844  BP: 109/65  Pulse: 89  Weight: 143 lb (64.9 kg)    Fetal Status: Fetal Heart Rate (bpm): 135 Fundal Height: 27 cm Movement: Present     General:  Alert, oriented and cooperative. Patient is in no acute distress.  Skin: Skin is warm and dry. No rash noted.   Cardiovascular: Normal heart rate noted  Respiratory: Normal respiratory effort, no problems with respiration noted  Abdomen: Soft, gravid, appropriate for gestational age.  Pain/Pressure: Present     Pelvic: Cervical exam deferred        Extremities: Normal range of motion.  Edema: None  Mental Status: Normal mood and affect. Normal behavior. Normal judgment and thought content.   Assessment and Plan:  Pregnancy: G2P0010 at [redacted]w[redacted]d 1. Supervision of normal first pregnancy, antepartum 28 wk labs today - Tdap vaccine greater than or equal to 7yo IM  2. Sickle cell trait (HCC) q trimester urine culture--today - Culture, OB Urine  3. Vaginal discharge Check swab - Cervicovaginal ancillary only( [place])  Preterm labor symptoms and general obstetric  precautions including but not limited to vaginal bleeding, contractions, leaking of fluid and fetal movement were reviewed in detail with the patient. Please refer to After Visit Summary for other counseling recommendations.   Return in 4 weeks (on 10/11/2019) for virtual, [facility].  Future Appointments  Date Time Provider Department Center  10/11/2019 10:15 AM Sharyon Cable, CNM [facility]    Reva Bores, MD

## 2019-09-13 NOTE — Patient Instructions (Addendum)
Considering Waterbirth? Guide for patients at [facility] [employer] [facility]  Why consider waterbirth?  . Gentle birth for babies . Less pain medicine used in labor . May allow for passive descent/less pushing . May reduce perineal tears  . More mobility and instinctive maternal position changes . Increased maternal relaxation . Reduced blood pressure in labor  Is waterbirth safe? What are the risks of infection, drowning or other complications?  . Infection: o Very low risk (3.7 % for tub vs 4.8% for bed) o 7 in 8000 waterbirths with documented infection o Poorly cleaned equipment most common cause o Slightly lower group B strep transmission rate  . Drowning o Maternal:  - Very low risk   - Related to seizures or fainting o Newborn:  - Very low risk. No evidence of increased risk of respiratory problems in multiple large studies - Physiological protection from breathing under water - Avoid underwater birth if there are any fetal complications - Once baby's head is out of the water, keep it out.  . Birth complication o Some reports of cord trauma, but risk decreased by bringing baby to surface gradually o No evidence of increased risk of shoulder dystocia. Mothers can usually change positions faster in water than in a bed, possibly aiding the maneuvers to free the shoulder.   You must attend a Doren Custard class at [facility]  3rd Wednesday of every month from 7-9pm  [employer] by calling [phone] or online at [email or web address]  Bring [place] the certificate from the class to your prenatal appointment  Meet with a midwife at 36 weeks to see if you can still plan a waterbirth and to sign the consent.   If you plan a waterbirth at [facility] and [facility] at [facility], you will need to purchase the following:  Fish net  Bathing suit top (optional)  Long-handled mirror (optional)  ?? I am not sure need this anymore Places to purchase or  rent supplies:   GotWebTools.is for tub purchases and supplies  Waterbirthsolutions.com for tub purchases and supplies  [employer] (www.thelaborladies.com) $275 for tub rental/set-up & take down/kit   [employer] (http://www.fleming.com/.htm) Information regarding doulas (labor support) who provide pool rentals  Things that would prevent you from having a waterbirth:  Premature, <37wks  Previous cesarean birth  Presence of thick meconium-stained fluid  Multiple gestation (Twins, triplets, etc.)  Uncontrolled diabetes or gestational diabetes requiring medication  Hypertension requiring medication or diagnosis of pre-eclampsia  Heavy vaginal bleeding  Non-reassuring fetal heart rate  Active infection (MRSA, etc.). Group B Strep is NOT a contraindication for waterbirth.  If your labor has to be induced and induction method requires continuous monitoring of the baby's heart rate  Other risks/issues identified by your obstetrical provider  Please remember that birth is unpredictable. Under certain unforeseeable circumstances your provider may advise against giving birth in the tub. These decisions will be made on a case-by-case basis and with the safety of you and your baby as our highest priority.     https://www.cdc.gov/vaccines/hcp/vis/vis-statements/tdap.pdf">    Tdap (Tetanus, Diphtheria, Pertussis) Vaccine: What You Need to Know 1. Why get vaccinated? Tdap vaccine can prevent tetanus, diphtheria, and pertussis. Diphtheria and pertussis spread from person to person. Tetanus enters the body through cuts or wounds.  TETANUS (T) causes painful stiffening of the muscles. Tetanus can lead to serious health problems, including being unable to open the mouth, having trouble swallowing and breathing, or death.  DIPHTHERIA (D) can  lead to difficulty breathing, heart failure, paralysis, or death.  PERTUSSIS (aP), also known as "whooping  cough," can cause uncontrollable, violent coughing which makes it hard to breathe, eat, or drink. Pertussis can be extremely serious in babies and young children, causing pneumonia, convulsions, brain damage, or death. In teens and adults, it can cause weight loss, loss of bladder control, passing out, and rib fractures from severe coughing. 2. Tdap vaccine Tdap is only for children 7 years and older, adolescents, and adults.  Adolescents should receive a single dose of Tdap, preferably at age 48 or 45 years. Pregnant women should get a dose of Tdap during every pregnancy, to protect the newborn from pertussis. Infants are most at risk for severe, life-threatening complications from pertussis. Adults who have never received Tdap should get a dose of Tdap. Also, adults should receive a booster dose every 10 years, or earlier in the case of a severe and dirty wound or burn. Booster doses can be either Tdap or Td (a different vaccine that protects against tetanus and diphtheria but not pertussis). Tdap may be given at the same time as other vaccines. 3. Talk with your health care provider Tell your vaccine provider if the person getting the vaccine:  Has had an allergic reaction after a previous dose of any vaccine that protects against tetanus, diphtheria, or pertussis, or has any severe, life-threatening allergies.  Has had a coma, decreased level of consciousness, or prolonged seizures within 7 days after a previous dose of any pertussis vaccine (DTP, DTaP, or Tdap).  Has seizures or another nervous system problem.  Has ever had Guillain-Barr Syndrome (also called GBS).  Has had severe pain or swelling after a previous dose of any vaccine that protects against tetanus or diphtheria. In some cases, your health care provider may decide to postpone Tdap vaccination to a future visit.  People with minor illnesses, such as a cold, may be vaccinated. People who are moderately or severely ill should  usually wait until they recover before getting Tdap vaccine.  Your health care provider can give you more information. 4. Risks of a vaccine reaction  Pain, redness, or swelling where the shot was given, mild fever, headache, feeling tired, and nausea, vomiting, diarrhea, or stomachache sometimes happen after Tdap vaccine. People sometimes faint after medical procedures, including vaccination. Tell your provider if you feel dizzy or have vision changes or ringing in the ears.  As with any medicine, there is a very remote chance of a vaccine causing a severe allergic reaction, other serious injury, or death. 5. What if there is a serious problem? An allergic reaction could occur after the vaccinated person leaves the clinic. If you see signs of a severe allergic reaction (hives, swelling of the face and throat, difficulty breathing, a fast heartbeat, dizziness, or weakness), call 9-1-1 and get the person to the nearest hospital. For other signs that concern you, call your health care provider.  Adverse reactions should be reported to the Vaccine Adverse Event Reporting System (VAERS). Your health care provider will usually file this report, or you can do it yourself. Visit the VAERS website at www.vaers.SamedayNews.es or call (484)486-3103. VAERS is only for reporting reactions, and VAERS staff do not give medical advice. 6. The National Vaccine Injury Compensation Program The [employer] Vaccine Injury Compensation Program (VICP) is a federal program that was created to compensate people who may have been injured by certain vaccines. Visit the VICP website at GoldCloset.com.ee or call (909)262-2737 to learn about  the program and about filing a claim. There is a time limit to file a claim for compensation. 7. How can I learn more?  Ask your health care provider.  Call your local or state health department.  Contact the Centers for Disease Control and Prevention (CDC): ? Call 308-050-3732  (1-800-CDC-INFO) or ? Visit CDC's website at http://hunter.com/ Vaccine Information Statement Tdap (Tetanus, Diphtheria, Pertussis) Vaccine (09/28/2018) This information is not intended to replace advice given to you by your health care provider. Make sure you discuss any questions you have with your health care provider. Document Revised: 10/07/2018 Document Reviewed: 10/10/2018 Elsevier Patient Education  [place].   Breastfeeding  Choosing to breastfeed is one of the best decisions you can make for yourself and your baby. A change in hormones during pregnancy causes your breasts to make breast milk in your milk-producing glands. Hormones prevent breast milk from being released before your baby is born. They also prompt milk flow after birth. Once breastfeeding has begun, thoughts of your baby, as well as his or her sucking or crying, can stimulate the release of milk from your milk-producing glands. Benefits of breastfeeding Research shows that breastfeeding offers many health benefits for infants and mothers. It also offers a cost-free and convenient way to feed your baby. For your baby  Your first milk (colostrum) helps your baby's digestive system to function better.  Special cells in your milk (antibodies) help your baby to fight off infections.  Breastfed babies are less likely to develop asthma, allergies, obesity, or type 2 diabetes. They are also at lower risk for sudden infant death syndrome (SIDS).  Nutrients in breast milk are better able to meet your baby's needs compared to infant formula.  Breast milk improves your baby's brain development. For you  Breastfeeding helps to create a very special bond between you and your baby.  Breastfeeding is convenient. Breast milk costs nothing and is always available at the correct temperature.  Breastfeeding helps to burn calories. It helps you to lose the weight that you gained during pregnancy.  Breastfeeding makes  your uterus return faster to its size before pregnancy. It also slows bleeding (lochia) after you give birth.  Breastfeeding helps to lower your risk of developing type 2 diabetes, osteoporosis, rheumatoid arthritis, cardiovascular disease, and breast, ovarian, uterine, and endometrial cancer later in life. Breastfeeding basics Starting breastfeeding  Find a comfortable place to sit or lie down, with your neck and back well-supported.  Place a pillow or a rolled-up blanket under your baby to bring him or her to the level of your breast (if you are seated). Nursing pillows are specially designed to help support your arms and your baby while you breastfeed.  Make sure that your baby's tummy (abdomen) is facing your abdomen.  Gently massage your breast. With your fingertips, massage from the outer edges of your breast inward toward the nipple. This encourages milk flow. If your milk flows slowly, you may need to continue this action during the feeding.  Support your breast with 4 fingers underneath and your thumb above your nipple (make the letter "C" with your hand). Make sure your fingers are well away from your nipple and your baby's mouth.  Stroke your baby's lips gently with your finger or nipple.  When your baby's mouth is open wide enough, quickly bring your baby to your breast, placing your entire nipple and as much of the areola as possible into your baby's mouth. The areola is the colored area  around your nipple. ? More areola should be visible above your baby's upper lip than below the lower lip. ? Your baby's lips should be opened and extended outward (flanged) to ensure an adequate, comfortable latch. ? Your baby's tongue should be between his or her lower gum and your breast.  Make sure that your baby's mouth is correctly positioned around your nipple (latched). Your baby's lips should create a seal on your breast and be turned out (everted).  It is common for your baby to suck  about 2-3 minutes in order to start the flow of breast milk. Latching Teaching your baby how to latch onto your breast properly is very important. An improper latch can cause nipple pain, decreased milk supply, and poor weight gain in your baby. Also, if your baby is not latched onto your nipple properly, he or she may swallow some air during feeding. This can make your baby fussy. Burping your baby when you switch breasts during the feeding can help to get rid of the air. However, teaching your baby to latch on properly is still the best way to prevent fussiness from swallowing air while breastfeeding. Signs that your baby has successfully latched onto your nipple  Silent tugging or silent sucking, without causing you pain. Infant's lips should be extended outward (flanged).  Swallowing heard between every 3-4 sucks once your milk has started to flow (after your let-down milk reflex occurs).  Muscle movement above and in front of his or her ears while sucking. Signs that your baby has not successfully latched onto your nipple  Sucking sounds or smacking sounds from your baby while breastfeeding.  Nipple pain. If you think your baby has not latched on correctly, slip your finger into the corner of your baby's mouth to break the suction and place it between your baby's gums. Attempt to start breastfeeding again. Signs of successful breastfeeding Signs from your baby  Your baby will gradually decrease the number of sucks or will completely stop sucking.  Your baby will fall asleep.  Your baby's body will relax.  Your baby will retain a small amount of milk in his or her mouth.  Your baby will let go of your breast by himself or herself. Signs from you  Breasts that have increased in firmness, weight, and size 1-3 hours after feeding.  Breasts that are softer immediately after breastfeeding.  Increased milk volume, as well as a change in milk consistency and color by the fifth day of  breastfeeding.  Nipples that are not sore, cracked, or bleeding. Signs that your baby is getting enough milk  Wetting at least 1-2 diapers during the first 24 hours after birth.  Wetting at least 5-6 diapers every 24 hours for the first week after birth. The urine should be clear or pale yellow by the age of 5 days.  Wetting 6-8 diapers every 24 hours as your baby continues to grow and develop.  At least 3 stools in a 24-hour period by the age of 5 days. The stool should be soft and yellow.  At least 3 stools in a 24-hour period by the age of 7 days. The stool should be seedy and yellow.  No loss of weight greater than 10% of birth weight during the first 3 days of life.  Average weight gain of 4-7 oz (113-198 g) per week after the age of 4 days.  Consistent daily weight gain by the age of 5 days, without weight loss after the age of  2 weeks. After a feeding, your baby may spit up a small amount of milk. This is normal. Breastfeeding frequency and duration Frequent feeding will help you make more milk and can prevent sore nipples and extremely full breasts (breast engorgement). Breastfeed when you feel the need to reduce the fullness of your breasts or when your baby shows signs of hunger. This is called "breastfeeding on demand." Signs that your baby is hungry include:  Increased alertness, activity, or restlessness.  Movement of the head from side to side.  Opening of the mouth when the corner of the mouth or cheek is stroked (rooting).  Increased sucking sounds, smacking lips, cooing, sighing, or squeaking.  Hand-to-mouth movements and sucking on fingers or hands.  Fussing or crying. Avoid introducing a pacifier to your baby in the first 4-6 weeks after your baby is born. After this time, you may choose to use a pacifier. Research has shown that pacifier use during the first year of a baby's life decreases the risk of sudden infant death syndrome (SIDS). Allow your baby to feed  on each breast as long as he or she wants. When your baby unlatches or falls asleep while feeding from the first breast, offer the second breast. Because newborns are often sleepy in the first few weeks of life, you may need to awaken your baby to get him or her to feed. Breastfeeding times will vary from baby to baby. However, the following rules can serve as a guide to help you make sure that your baby is properly fed:  Newborns (babies 2 weeks of age or younger) may breastfeed every 1-3 hours.  Newborns should not go without breastfeeding for longer than 3 hours during the day or 5 hours during the night.  You should breastfeed your baby a minimum of 8 times in a 24-hour period. Breast milk pumping     Pumping and storing breast milk allows you to make sure that your baby is exclusively fed your breast milk, even at times when you are unable to breastfeed. This is especially important if you go back to work while you are still breastfeeding, or if you are not able to be present during feedings. Your lactation consultant can help you find a method of pumping that works best for you and give you guidelines about how long it is safe to store breast milk. Caring for your breasts while you breastfeed Nipples can become dry, cracked, and sore while breastfeeding. The following recommendations can help keep your breasts moisturized and healthy:  Avoid using soap on your nipples.  Wear a supportive bra designed especially for nursing. Avoid wearing underwire-style bras or extremely tight bras (sports bras).  Air-dry your nipples for 3-4 minutes after each feeding.  Use only cotton bra pads to absorb leaked breast milk. Leaking of breast milk between feedings is normal.  Use lanolin on your nipples after breastfeeding. Lanolin helps to maintain your skin's normal moisture barrier. Pure lanolin is not harmful (not toxic) to your baby. You may also hand express a few drops of breast milk and gently  massage that milk into your nipples and allow the milk to air-dry. In the first few weeks after giving birth, some women experience breast engorgement. Engorgement can make your breasts feel heavy, warm, and tender to the touch. Engorgement peaks within 3-5 days after you give birth. The following recommendations can help to ease engorgement:  Completely empty your breasts while breastfeeding or pumping. You may want to start by  applying warm, moist heat (in the shower or with warm, water-soaked hand towels) just before feeding or pumping. This increases circulation and helps the milk flow. If your baby does not completely empty your breasts while breastfeeding, pump any extra milk after he or she is finished.  Apply ice packs to your breasts immediately after breastfeeding or pumping, unless this is too uncomfortable for you. To do this: ? Put ice in a plastic bag. ? Place a towel between your skin and the bag. ? Leave the ice on for 20 minutes, 2-3 times a day.  Make sure that your baby is latched on and positioned properly while breastfeeding. If engorgement persists after 48 hours of following these recommendations, contact your health care provider or a Science writer. Overall health care recommendations while breastfeeding  Eat 3 healthy meals and 3 snacks every day. Well-nourished mothers who are breastfeeding need an additional 450-500 calories a day. You can meet this requirement by increasing the amount of a balanced diet that you eat.  Drink enough water to keep your urine pale yellow or clear.  Rest often, relax, and continue to take your prenatal vitamins to prevent fatigue, stress, and low vitamin and mineral levels in your body (nutrient deficiencies).  Do not use any products that contain nicotine or tobacco, such as cigarettes and e-cigarettes. Your baby may be harmed by chemicals from cigarettes that pass into breast milk and exposure to secondhand smoke. If you need help  quitting, ask your health care provider.  Avoid alcohol.  Do not use illegal drugs or marijuana.  Talk with your health care provider before taking any medicines. These include over-the-counter and prescription medicines as well as vitamins and herbal supplements. Some medicines that may be harmful to your baby can pass through breast milk.  It is possible to become pregnant while breastfeeding. If birth control is desired, ask your health care provider about options that will be safe while breastfeeding your baby. Where to find more information: [employer]: www.llli.org Contact a health care provider if:  You feel like you want to stop breastfeeding or have become frustrated with breastfeeding.  Your nipples are cracked or bleeding.  Your breasts are red, tender, or warm.  You have: ? Painful breasts or nipples. ? A swollen area on either breast. ? A fever or chills. ? Nausea or vomiting. ? Drainage other than breast milk from your nipples.  Your breasts do not become full before feedings by the fifth day after you give birth.  You feel sad and depressed.  Your baby is: ? Too sleepy to eat well. ? Having trouble sleeping. ? More than 77 week old and wetting fewer than 6 diapers in a 24-hour period. ? Not gaining weight by 65 days of age.  Your baby has fewer than 3 stools in a 24-hour period.  Your baby's skin or the white parts of his or her eyes become yellow. Get help right away if:  Your baby is overly tired (lethargic) and does not want to wake up and feed.  Your baby develops an unexplained fever. Summary  Breastfeeding offers many health benefits for infant and mothers.  Try to breastfeed your infant when he or she shows early signs of hunger.  Gently tickle or stroke your baby's lips with your finger or nipple to allow the baby to open his or her mouth. Bring the baby to your breast. Make sure that much of the areola is in your baby's mouth.  Offer one side and burp the baby before you offer the other side.  Talk with your health care provider or lactation consultant if you have questions or you face problems as you breastfeed. This information is not intended to replace advice given to you by your health care provider. Make sure you discuss any questions you have with your health care provider. Document Revised: 09/09/2017 Document Reviewed: 07/17/2016 Elsevier Patient Education  [place].

## 2019-09-14 LAB — CBC
Hematocrit: 29 % — ABNORMAL LOW (ref 34.0–46.6)
Hemoglobin: 9.9 g/dL — ABNORMAL LOW (ref 11.1–15.9)
MCH: 33.2 pg — ABNORMAL HIGH (ref 26.6–33.0)
MCHC: 34.1 g/dL (ref 31.5–35.7)
MCV: 97 fL (ref 79–97)
Platelets: 244 10*3/uL (ref 150–450)
RBC: 2.98 x10E6/uL — ABNORMAL LOW (ref 3.77–5.28)
RDW: 11.8 % (ref 11.7–15.4)
WBC: 6.4 10*3/uL (ref 3.4–10.8)

## 2019-09-14 LAB — GLUCOSE TOLERANCE, 2 HOURS W/ 1HR
Glucose, 1 hour: 182 mg/dL — ABNORMAL HIGH (ref 65–179)
Glucose, 2 hour: 99 mg/dL (ref 65–152)
Glucose, Fasting: 83 mg/dL (ref 65–91)

## 2019-09-14 LAB — CERVICOVAGINAL ANCILLARY ONLY
Bacterial Vaginitis (gardnerella): NEGATIVE
Candida Glabrata: NEGATIVE
Candida Vaginitis: NEGATIVE
Chlamydia: NEGATIVE
Comment: NEGATIVE
Comment: NEGATIVE
Comment: NEGATIVE
Comment: NEGATIVE
Comment: NEGATIVE
Comment: NORMAL
Neisseria Gonorrhea: POSITIVE — AB
Trichomonas: NEGATIVE

## 2019-09-14 LAB — RPR: RPR Ser Ql: NONREACTIVE

## 2019-09-14 LAB — HIV ANTIBODY (ROUTINE TESTING W REFLEX): HIV Screen 4th Generation wRfx: NONREACTIVE

## 2019-09-14 MED ORDER — FERROUS SULFATE 325 (65 FE) MG PO TABS: 325.0000 mg | ORAL_TABLET | Freq: Two times a day (BID) | ORAL | 1 refills | Status: DC

## 2019-09-15 ENCOUNTER — Telehealth: Payer: Self-pay | Admitting: *Deleted

## 2019-09-15 ENCOUNTER — Encounter: Payer: Self-pay | Admitting: *Deleted

## 2019-09-15 LAB — URINE CULTURE, OB REFLEX

## 2019-09-15 LAB — CULTURE, OB URINE

## 2019-09-15 NOTE — Telephone Encounter (Addendum)
-----   Message from Reva Bores, MD sent at 09/15/2019  7:49 AM EDT ----- Still positive for GC--treat per protocol, message to [facility]  [id number]  Called pt and left message to call the office for test result information. MyChart message also sent to pt. STD card completed and faxed to [facility]

## 2019-09-18 ENCOUNTER — Other Ambulatory Visit: Payer: Self-pay | Admitting: *Deleted

## 2019-09-18 DIAGNOSIS — O24419 Gestational diabetes mellitus in pregnancy, unspecified control: Secondary | ICD-10-CM

## 2019-09-18 NOTE — Telephone Encounter (Signed)
Called pt and asked pt if she was aware of her test results.  Pt reports that she saw them via MyChart and that she has an appt scheduled on 09/21/19 for treatment.  Pt stated that she does not have any further questions and will be here on 09/21/19.  Addison Naegeli, RN  09/18/19

## 2019-09-21 ENCOUNTER — Other Ambulatory Visit: Payer: Self-pay

## 2019-09-21 ENCOUNTER — Encounter: Payer: Medicaid Other | Attending: Family Medicine | Admitting: Registered"

## 2019-09-21 ENCOUNTER — Ambulatory Visit (INDEPENDENT_AMBULATORY_CARE_PROVIDER_SITE_OTHER): Payer: Medicaid Other | Admitting: General Practice

## 2019-09-21 ENCOUNTER — Ambulatory Visit: Payer: Medicaid Other | Admitting: Registered"

## 2019-09-21 VITALS — BP 101/59 | HR 86 | Ht 64.0 in | Wt 145.0 lb

## 2019-09-21 DIAGNOSIS — O24419 Gestational diabetes mellitus in pregnancy, unspecified control: Secondary | ICD-10-CM | POA: Insufficient documentation

## 2019-09-21 DIAGNOSIS — Z3A28 28 weeks gestation of pregnancy: Secondary | ICD-10-CM | POA: Diagnosis not present

## 2019-09-21 DIAGNOSIS — Z713 Dietary counseling and surveillance: Secondary | ICD-10-CM | POA: Insufficient documentation

## 2019-09-21 DIAGNOSIS — O98213 Gonorrhea complicating pregnancy, third trimester: Secondary | ICD-10-CM | POA: Diagnosis present

## 2019-09-21 DIAGNOSIS — Z3A00 Weeks of gestation of pregnancy not specified: Secondary | ICD-10-CM | POA: Insufficient documentation

## 2019-09-21 MED ORDER — ACCU-CHEK GUIDE W/DEVICE KIT: 1.0000 | PACK | Freq: Every day | 0 refills | Status: DC

## 2019-09-21 MED ORDER — ACCU-CHEK SOFTCLIX LANCETS MISC: 12 refills | Status: DC

## 2019-09-21 MED ORDER — ACCU-CHEK GUIDE VI STRP: ORAL_STRIP | 12 refills | Status: DC

## 2019-09-21 MED ORDER — CEFTRIAXONE SODIUM 500 MG IJ SOLR
500.0000 mg | Freq: Once | INTRAMUSCULAR | Status: AC
  Administered 2019-09-21: 500 mg via INTRAMUSCULAR

## 2019-09-21 NOTE — Progress Notes (Addendum)
Patient was seen on 09/21/19 for Gestational Diabetes self-management. EDD 12/09/19. Patient states no history of GDM. Diet history obtained. Patient eats variety of all food groups. Beverages include water, juice, sweat tea, soda.  Patient is likely consuming excess carbohydrates in the form of sweetened beverages.   Patient states she is not getting enough sleep. Feels more tired than usual. Pt states she just started iron and B12 supplement.  The following learning objectives were met by the patient :   States the definition of Gestational Diabetes  States why dietary management is important in controlling blood glucose  Describes the effects of carbohydrates on blood glucose levels  Demonstrates ability to create a balanced meal plan  Demonstrates carbohydrate counting   States when to check blood glucose levels  Demonstrates proper blood glucose monitoring techniques  States the effect of stress and exercise on blood glucose levels  States the importance of limiting caffeine and abstaining from alcohol and smoking  Plan:  Aim for 3 Carbohydrate Choices per meal (45 grams) +/- 1 either way  Aim for 1-2 Carbohydrate Choices per snack Begin reading food labels for Total Carbohydrate of foods If OK with your MD, consider  increasing your activity level by walking, Arm Chair Exercises or other activity daily as tolerated Begin checking Blood Glucose before breakfast and 2 hours after first bite of breakfast, lunch and dinner as directed by MD  Bring Log Book/Sheet and meter to every medical appointment OR use Baby Scripts: Patient was plans to use as record of blood glucose electronically Take medication if directed by MD  Blood glucose monitor Rx called into pharmacy: Accu Check Guide with Softclix lancets  Patient instructed to monitor glucose levels: FBS: 60 - 95 mg/dl 2 hour: <120 mg/dl  Patient received the following handouts:  Nutrition Diabetes and  Pregnancy  Carbohydrate Counting List  Blood glucose Log Sheet  Patient will be seen for follow-up in as needed

## 2019-09-21 NOTE — Progress Notes (Signed)
Agree with A & P. 

## 2019-09-21 NOTE — Progress Notes (Signed)
Patient presents to office today for treatment of gonorrhea. Injection administered without complication. Patient will follow up at next OB visit.   Chase Caller RN BSN 09/21/19

## 2019-09-27 ENCOUNTER — Other Ambulatory Visit: Payer: Self-pay

## 2019-09-28 NOTE — Progress Notes (Signed)
Agree with A & P. 

## 2019-10-01 ENCOUNTER — Encounter: Payer: Self-pay | Admitting: Family Medicine

## 2019-10-11 ENCOUNTER — Telehealth (INDEPENDENT_AMBULATORY_CARE_PROVIDER_SITE_OTHER): Payer: Medicaid Other | Admitting: Obstetrics & Gynecology

## 2019-10-11 ENCOUNTER — Telehealth: Payer: Medicaid Other | Admitting: Certified Nurse Midwife

## 2019-10-11 DIAGNOSIS — O24410 Gestational diabetes mellitus in pregnancy, diet controlled: Secondary | ICD-10-CM

## 2019-10-11 DIAGNOSIS — Z3400 Encounter for supervision of normal first pregnancy, unspecified trimester: Secondary | ICD-10-CM

## 2019-10-11 DIAGNOSIS — Z3A31 31 weeks gestation of pregnancy: Secondary | ICD-10-CM

## 2019-10-11 DIAGNOSIS — O0993 Supervision of high risk pregnancy, unspecified, third trimester: Secondary | ICD-10-CM

## 2019-10-11 MED ORDER — ALBUTEROL SULFATE HFA 108 (90 BASE) MCG/ACT IN AERS: 2.0000 | INHALATION_SPRAY | Freq: Four times a day (QID) | RESPIRATORY_TRACT | 1 refills | Status: DC | PRN

## 2019-10-11 NOTE — Progress Notes (Signed)
  Patient ID: Shari Davidson, female   DOB: 11-10-1990, 29 y.o.   MRN: [id number]    PRENATAL VISIT NOTE  Subjective:  Shari Davidson is a 29 y.o. G2P0010 at [redacted]w[redacted]d being seen today for ongoing prenatal care.  She is currently monitored for the following issues for this low-risk pregnancy and has Migraine headache; MYOPIA; RHINITIS, ALLERGIC; ASTHMA, INTERMITTENT; Constipation; Depression, major, recurrent, moderate (HCC); Supervision of normal first pregnancy, antepartum; and Sickle cell trait (HCC) on their problem list.  Patient reports no complaints.  Contractions: Not present. Vag. Bleeding: None.  Movement: Present. Denies leaking of fluid.   The following portions of the patient's history were reviewed and updated as appropriate: allergies, current medications, past family history, past medical history, past social history, past surgical history and problem list.   Objective:   Vitals:   10/11/19 1011  BP: (!) 113/58  Pulse: 80    Fetal Status:     Movement: Present     General:  Alert, oriented and cooperative. Patient is in no acute distress.  Skin: Skin is warm and dry. No rash noted.   Cardiovascular: Normal heart rate noted  Respiratory: Normal respiratory effort, no problems with respiration noted  Abdomen: Soft, gravid, appropriate for gestational age.  Pain/Pressure: Present     Pelvic: Cervical exam deferred        Extremities: Normal range of motion.  Edema: None  Mental Status: Normal mood and affect. Normal behavior. Normal judgment and thought content.   Assessment and Plan:  Pregnancy: G2P0010 at [redacted]w[redacted]d 1. Supervision of normal first pregnancy, antepartum Mother doing well. Precautions reviewed  2. Diet controlled gestational diabetes mellitus (GDM), antepartum Well controlled on diet regimen     Preterm labor symptoms and general obstetric precautions including but not limited to vaginal bleeding, contractions, leaking of fluid and fetal  movement were reviewed in detail with the patient. Please refer to After Visit Summary for other counseling recommendations.   No follow-ups on file.  No future appointments.  Malachy Chamber, MD

## 2019-10-11 NOTE — Progress Notes (Signed)
Doing well a little worried about her asthma.

## 2019-10-26 ENCOUNTER — Other Ambulatory Visit: Payer: Self-pay | Admitting: Family Medicine

## 2019-11-08 ENCOUNTER — Ambulatory Visit (INDEPENDENT_AMBULATORY_CARE_PROVIDER_SITE_OTHER): Payer: Medicaid Other | Admitting: Women's Health

## 2019-11-08 ENCOUNTER — Encounter: Payer: Medicaid Other | Admitting: Women's Health

## 2019-11-08 ENCOUNTER — Other Ambulatory Visit: Payer: Self-pay

## 2019-11-08 ENCOUNTER — Other Ambulatory Visit (HOSPITAL_COMMUNITY)
Admission: RE | Admit: 2019-11-08 | Discharge: 2019-11-08 | Disposition: A | Discharge status: Home / Self Care | Payer: Medicaid Other | Source: Ambulatory Visit | Attending: Women's Health | Admitting: Women's Health

## 2019-11-08 VITALS — BP 112/77 | HR 90 | Wt 154.0 lb

## 2019-11-08 DIAGNOSIS — O98213 Gonorrhea complicating pregnancy, third trimester: Secondary | ICD-10-CM | POA: Insufficient documentation

## 2019-11-08 DIAGNOSIS — Z3A35 35 weeks gestation of pregnancy: Secondary | ICD-10-CM | POA: Diagnosis not present

## 2019-11-08 DIAGNOSIS — O99013 Anemia complicating pregnancy, third trimester: Secondary | ICD-10-CM

## 2019-11-08 DIAGNOSIS — O24419 Gestational diabetes mellitus in pregnancy, unspecified control: Secondary | ICD-10-CM | POA: Insufficient documentation

## 2019-11-08 DIAGNOSIS — F331 Major depressive disorder, recurrent, moderate: Secondary | ICD-10-CM

## 2019-11-08 DIAGNOSIS — Z3400 Encounter for supervision of normal first pregnancy, unspecified trimester: Secondary | ICD-10-CM

## 2019-11-08 DIAGNOSIS — R21 Rash and other nonspecific skin eruption: Secondary | ICD-10-CM

## 2019-11-08 DIAGNOSIS — O24410 Gestational diabetes mellitus in pregnancy, diet controlled: Secondary | ICD-10-CM

## 2019-11-08 DIAGNOSIS — O99343 Other mental disorders complicating pregnancy, third trimester: Secondary | ICD-10-CM

## 2019-11-08 DIAGNOSIS — J45909 Unspecified asthma, uncomplicated: Secondary | ICD-10-CM

## 2019-11-08 DIAGNOSIS — A549 Gonococcal infection, unspecified: Secondary | ICD-10-CM

## 2019-11-08 DIAGNOSIS — O99513 Diseases of the respiratory system complicating pregnancy, third trimester: Secondary | ICD-10-CM

## 2019-11-08 DIAGNOSIS — D573 Sickle-cell trait: Secondary | ICD-10-CM

## 2019-11-08 LAB — GLUCOSE, CAPILLARY: Glucose-Capillary: 77 mg/dL (ref 70–99)

## 2019-11-08 NOTE — Progress Notes (Signed)
Subjective:  Shari Davidson is a 29 y.o. G2P0010 at [redacted]w[redacted]d being seen today for ongoing prenatal care.  She is currently monitored for the following issues for this low-risk pregnancy and has Migraine headache; MYOPIA; RHINITIS, ALLERGIC; Asthma; Constipation; Depression, major, recurrent, moderate (HCC); Supervision of normal first pregnancy, antepartum; Sickle cell trait (HCC); and Gestational diabetes on their problem list.  Patient reports small, multiple, inflamed red lesions with pustule head (almost acne like in appearance) on bilateral shoulders. Patient reports it appeared one week ago and no one else that she knows or lives with has it. Patient reports new fabric softener a week in the past week. Patient also endorses mild itching on shoulders. Patient denies lesions on breasts or abdomen.  Contractions: Not present. Vag. Bleeding: None.  Movement: Present. Denies leaking of fluid.   The following portions of the patient's history were reviewed and updated as appropriate: allergies, current medications, past family history, past medical history, past social history, past surgical history and problem list. Problem list updated.   Objective:   Vitals:   11/08/19 1054  BP: 112/77  Pulse: 90  Weight: 154 lb (69.9 kg)    Fetal Status: Fetal Heart Rate (bpm): 154 Fundal Height: 35 cm Movement: Present     General:  Alert, oriented and cooperative. Patient is in no acute distress.  Skin: Skin is warm and dry. No rash noted.   Cardiovascular: Normal heart rate noted  Respiratory: Normal respiratory effort, no problems with respiration noted  Abdomen: Soft, gravid, appropriate for gestational age. Pain/Pressure: Present     Pelvic: Vag. Bleeding: None     Cervical exam deferred        Extremities: Normal range of motion.  Edema: None  Mental Status: Normal mood and affect. Normal behavior. Normal judgment and thought content.   Urinalysis:      Assessment and Plan:   Pregnancy: G2P0010 at [redacted]w[redacted]d  1. Supervision of normal first pregnancy, antepartum -pt requesting visit with midwife next visit to discuss waterbirth -cultures/GBS next visit  2. Depression, major, recurrent, moderate (HCC) -past issue, patient denies concerns at this time  3. Asthma, unspecified asthma severity, unspecified whether complicated, unspecified whether persistent -patient has a new inhaler at [facility] for pick-up, but pt reports not needing inhaler for over a month  4. Sickle cell trait (HCC) - Urine Culture  5. Diet controlled gestational diabetes mellitus (GDM), antepartum -needs growth scan, will schedule today, order entered last visit -BS since visit on 10/11/2019 unavailable, pt has been taken care of her sick mother and has not logged any numbers since this visit -fasting glucose today 77 -pt not on ASA   6. Gonorrhea affecting pregnancy in third trimester - TOC today - Cervicovaginal ancillary only( [place])  7. Skin rash - pt to stop new fabric softener and see if rash resolves -pt instructed to call office if rash worsens or she develops new symptoms   Preterm labor symptoms and general obstetric precautions including but not limited to vaginal bleeding, contractions, leaking of fluid and fetal movement were reviewed in detail with the patient. Please refer to After Visit Summary for other counseling recommendations.  Return in about 1 week (around 11/15/2019) for HOB/GBS/cultures, needs growth scan scheduled 36 weeks, MIDWIFE next visit.   Peri Kreft, Odie Sera, NP

## 2019-11-08 NOTE — Patient Instructions (Addendum)
Maternity Assessment Unit (MAU)  The Maternity Assessment Unit (MAU) is located at the [facility] [place] [facility]. The address is: [address] [place], [place], [place] [address]. Please see map below for additional directions.    The Maternity Assessment Unit is designed to help you during your pregnancy, and for up to 6 weeks after delivery, with any pregnancy- or postpartum-related emergencies, if you think you are in labor, or if your water has broken. For example, if you experience nausea and vomiting, vaginal bleeding, severe abdominal or pelvic pain, elevated blood pressure or other problems related to your pregnancy or postpartum time, please come to the Maternity Assessment Unit for assistance.        Preterm Labor and Birth Information  The normal length of a pregnancy is 39-41 weeks. Preterm labor is when labor starts before 37 completed weeks of pregnancy. What are the risk factors for preterm labor? Preterm labor is more likely to occur in women who:  Have certain infections during pregnancy such as a bladder infection, sexually transmitted infection, or infection inside the uterus (chorioamnionitis).  Have a shorter-than-normal cervix.  Have gone into preterm labor before.  Have had surgery on their cervix.  Are younger than age 64 or older than age 26.  Are African American.  Are pregnant with twins or multiple babies (multiple gestation).  Take street drugs or smoke while pregnant.  Do not gain enough weight while pregnant.  Became pregnant shortly after having been pregnant. What are the symptoms of preterm labor? Symptoms of preterm labor include:  Cramps similar to those that can happen during a menstrual period. The cramps may happen with diarrhea.  Pain in the abdomen or lower back.  Regular uterine contractions that may feel like tightening of the abdomen.  A feeling of increased pressure in the  pelvis.  Increased watery or bloody mucus discharge from the vagina.  Water breaking (ruptured amniotic sac). Why is it important to recognize signs of preterm labor? It is important to recognize signs of preterm labor because babies who are born prematurely may not be fully developed. This can put them at an increased risk for:  Long-term (chronic) heart and lung problems.  Difficulty immediately after birth with regulating body systems, including blood sugar, body temperature, heart rate, and breathing rate.  Bleeding in the brain.  Cerebral palsy.  Learning difficulties.  Death. These risks are highest for babies who are born before 38 weeks of pregnancy. How is preterm labor treated? Treatment depends on the length of your pregnancy, your condition, and the health of your baby. It may involve:  Having a stitch (suture) placed in your cervix to prevent your cervix from opening too early (cerclage).  Taking or being given medicines, such as: ? Hormone medicines. These may be given early in pregnancy to help support the pregnancy. ? Medicine to stop contractions. ? Medicines to help mature the baby's lungs. These may be prescribed if the risk of delivery is high. ? Medicines to prevent your baby from developing cerebral palsy. If the labor happens before 34 weeks of pregnancy, you may need to stay in the hospital. What should I do if I think I am in preterm labor? If you think that you are going into preterm labor, call your health care provider right away. How can I prevent preterm labor in future pregnancies? To increase your chance of having a full-term pregnancy:  Do not use any tobacco products, such as  cigarettes, chewing tobacco, and e-cigarettes. If you need help quitting, ask your health care provider.  Do not use street drugs or medicines that have not been prescribed to you during your pregnancy.  Talk with your health care provider before taking any herbal  supplements, even if you have been taking them regularly.  Make sure you gain a healthy amount of weight during your pregnancy.  Watch for infection. If you think that you might have an infection, get it checked right away.  Make sure to tell your health care provider if you have gone into preterm labor before. This information is not intended to replace advice given to you by your health care provider. Make sure you discuss any questions you have with your health care provider. Document Revised: 10/07/2018 Document Reviewed: 11/06/2015 Elsevier Patient Education  [place].        Gestational Diabetes Mellitus, Self Care Caring for yourself after you have been diagnosed with gestational diabetes (gestational diabetes mellitus) means keeping your blood sugar (glucose) under control. You can do that with a balance of:  Nutrition.  Exercise.  Lifestyle changes.  Medicines or insulin, if necessary.  Support from your team of health care providers and others. The following information explains what you need to know to manage your gestational diabetes at home. What are the risks? If gestational diabetes is treated, it is unlikely to cause problems. If it is not controlled with treatment, it may cause problems during labor and delivery, and some of those problems can be harmful to the unborn baby (fetus) and the mother. Uncontrolled gestational diabetes may also cause the newborn baby to have breathing problems and low blood glucose. Women who get gestational diabetes are more likely to develop it if they get pregnant again, and they are more likely to develop type 2 diabetes in the future. How to monitor blood glucose   Check your blood glucose every day during your pregnancy. Do this as often as told by your health care provider.  Contact your health care provider if your blood glucose is above your target for two tests in a row. Your health care provider will set  individualized treatment goals for you. Generally, the goal of treatment is to maintain the following blood glucose levels during pregnancy:  Before meals (preprandial): at or below 95 mg/dL (5.3 mmol/L).  After meals (postprandial): ? One hour after a meal: at or below 140 mg/dL (7.8 mmol/L). ? Two hours after a meal: at or below 120 mg/dL (6.7 mmol/L).  A1c (hemoglobin A1c) level: 6-6.5%. How to manage hyperglycemia and hypoglycemia Hyperglycemia symptoms Hyperglycemia, also called high blood glucose, occurs when blood glucose is too high. Make sure you know the early signs of hyperglycemia, such as:  Increased thirst.  Hunger.  Feeling very tired.  Needing to urinate more often than usual.  Blurry vision. Hypoglycemia symptoms Hypoglycemia, also called low blood glucose, occurs with a blood glucose level at or below 70 mg/dL (3.9 mmol/L). The risk for hypoglycemia increases during or after exercise, during sleep, during illness, and when skipping meals or not eating for a long time (fasting). Symptoms may include:  Hunger.  Anxiety.  Sweating and feeling clammy.  Confusion.  Dizziness or feeling light-headed.  Sleepiness.  Nausea.  Increased heart rate.  Headache.  Blurry vision.  Irritability.  Tingling or numbness around the mouth, lips, or tongue.  A change in coordination.  Restless sleep.  Fainting.  Seizure. It is important to know the symptoms  of hypoglycemia and treat it right away. Always have a 15-gram rapid-acting carbohydrate snack with you to treat low blood glucose. Family members and close friends should also know the symptoms and should understand how to treat hypoglycemia, in case you are not able to treat yourself. Treating hypoglycemia If you are alert and able to swallow safely, follow the 15:15 rule:  Take 15 grams of a rapid-acting carbohydrate. Talk with your health care provider about how much you should take.  Rapid-acting  options include: ? Glucose pills (take 15 grams). ? 6-8 pieces of hard candy. ? 4-6 oz (120-150 mL) of fruit juice. ? 4-6 oz (120-150 mL) of regular (not diet) soda. ? 1 Tbsp (15 mL) honey or sugar.  Check your blood glucose 15 minutes after you take the carbohydrate.  If the repeat blood glucose level is still at or below 70 mg/dL (3.9 mmol/L), take 15 grams of a carbohydrate again.  If your blood glucose level does not increase above 70 mg/dL (3.9 mmol/L) after 3 tries, seek emergency medical care.  After your blood glucose level returns to normal, eat a meal or a snack within 1 hour. Treating severe hypoglycemia Severe hypoglycemia is when your blood glucose level is at or below 54 mg/dL (3 mmol/L). Severe hypoglycemia is an emergency. Do not wait to see if the symptoms will go away. Get medical help right away. Call your local emergency services (911 in the [place] If you have severe hypoglycemia and you cannot eat or drink, you may need an injection of glucagon. A family member or close friend should learn how to check your blood glucose and how to give you a glucagon injection. Ask your health care provider if you need to have an emergency glucagon injection kit available. Severe hypoglycemia may need to be treated in a hospital. The treatment may include getting glucose through an IV. You may also need treatment for the cause of your hypoglycemia. Follow these instructions at home: Take diabetes medicines as told  If your health care provider prescribed insulin or diabetes medicines, take them every day.  Do not run out of insulin or other diabetes medicines that you take. Plan ahead so you always have these available.  If you use insulin, adjust your dosage based on how physically active you are and what foods you eat. Your health care provider will tell you how to adjust your dosage. Make healthy food choices  The things that you eat and drink affect your blood glucose (and your  insulin dose, if this applies). Making good choices helps to control your diabetes and prevent other health problems. A healthy meal plan includes eating lean proteins, complex carbohydrates, fresh fruits and vegetables, low-fat dairy products, and healthy fats. Make an appointment to see a diet and nutrition specialist (registered dietitian) to help you create an eating plan that is right for you. Make sure that you:  Follow instructions from your health care provider about eating or drinking restrictions.  Drink enough fluid to keep your urine pale yellow.  Eat healthy snacks between nutritious meals.  Keep a record of the carbohydrates that you eat. Do this by reading food labels and learning the standard serving sizes of foods.  Follow your sick day plan whenever you cannot eat or drink as usual. Make this plan in advance with your health care provider.  Stay active  Do 30 or more minutes of physical activity a day, or as much physical activity as your health care provider  recommends during your pregnancy. ? Doing 10 minutes of exercise starting 30 minutes after each meal may help to control postprandial blood glucose levels.  If you start a new exercise or activity, work with your health care provider to adjust your insulin, medicines, or food intake as needed. Make healthy lifestyle choices  Do not drink alcohol.  Do not use any tobacco products, such as cigarettes, chewing tobacco, and e-cigarettes. If you need help quitting, ask your health care provider.  Learn to manage stress. If you need help with this, ask your health care provider. Care for your body  Keep your immunizations up to date.  Brush your teeth and gums two times a day, and floss one or more times a day. Visit your dentist one or more times every 6 months.  Maintain a healthy weight during your pregnancy. General instructions  Take over-the-counter and prescription medicines only as told by your health care  provider.  Talk with your health care provider about your risk for high blood pressure during pregnancy (preeclampsia or eclampsia).  Share your diabetes management plan with people in your workplace, school, and household.  Check your urine for ketones during your pregnancy when you are ill and as told by your health care provider.  Carry a medical alert card or wear medical alert jewelry that says you have gestational diabetes.  Keep all follow-up visits during your pregnancy (prenatal) and after delivery (postnatal) as told by your health care provider. This is important. Get the care that you need after delivery  Have your blood glucose level checked 4-12 weeks after delivery. This is done with an oral glucose tolerance test (OGTT).  Get screened for diabetes at least every 3 years, or as often as told by your health care provider. Questions to ask your health care provider  Do I need to meet with a diabetes educator?  Where can I find a support group for people with gestational diabetes? Where to find more information For more information about gestational diabetes, visit:  American Diabetes Association (ADA): www.diabetes.org  Centers for Disease Control and Prevention (CDC): http://www.wolf.info/ Summary  Check your blood glucose every day during your pregnancy. Do this as often as told by your health care provider.  If your health care provider prescribed insulin or diabetes medicines, take them every day as told.  Keep all follow-up visits during your pregnancy (prenatal) and after delivery (postnatal) as told by your health care provider. This is important.  Have your blood glucose level checked 4-12 weeks after delivery. This information is not intended to replace advice given to you by your health care provider. Make sure you discuss any questions you have with your health care provider. Document Revised: 12/06/2017 Document Reviewed: 07/19/2015 Elsevier Patient Education   2020 [employer].

## 2019-11-09 LAB — CERVICOVAGINAL ANCILLARY ONLY
Chlamydia: NEGATIVE
Comment: NEGATIVE
Comment: NORMAL
Neisseria Gonorrhea: NEGATIVE

## 2019-11-15 ENCOUNTER — Other Ambulatory Visit: Payer: Self-pay

## 2019-11-15 ENCOUNTER — Ambulatory Visit (INDEPENDENT_AMBULATORY_CARE_PROVIDER_SITE_OTHER): Payer: Medicaid Other | Admitting: Medical

## 2019-11-15 ENCOUNTER — Encounter: Payer: Self-pay | Admitting: Medical

## 2019-11-15 VITALS — BP 119/77 | HR 80 | Wt 152.0 lb

## 2019-11-15 DIAGNOSIS — Z3A36 36 weeks gestation of pregnancy: Secondary | ICD-10-CM

## 2019-11-15 DIAGNOSIS — Z3400 Encounter for supervision of normal first pregnancy, unspecified trimester: Secondary | ICD-10-CM

## 2019-11-15 DIAGNOSIS — O24410 Gestational diabetes mellitus in pregnancy, diet controlled: Secondary | ICD-10-CM

## 2019-11-15 NOTE — Patient Instructions (Signed)
Fetal Movement Counts Patient Name: ________________________________________________ Patient Due Date: ____________________ What is a fetal movement count?  A fetal movement count is the number of times that you feel your baby move during a certain amount of time. This may also be called a fetal kick count. A fetal movement count is recommended for every pregnant woman. You may be asked to start counting fetal movements as early as week 28 of your pregnancy. Pay attention to when your baby is most active. You may notice your baby's sleep and wake cycles. You may also notice things that make your baby move more. You should do a fetal movement count:  When your baby is normally most active.  At the same time each day. A good time to count movements is while you are resting, after having something to eat and drink. How do I count fetal movements? 1. Find a quiet, comfortable area. Sit, or lie down on your side. 2. Write down the date, the start time and stop time, and the number of movements that you felt between those two times. Take this information with you to your health care visits. 3. Write down your start time when you feel the first movement. 4. Count kicks, flutters, swishes, rolls, and jabs. You should feel at least 10 movements. 5. You may stop counting after you have felt 10 movements, or if you have been counting for 2 hours. Write down the stop time. 6. If you do not feel 10 movements in 2 hours, contact your health care provider for further instructions. Your health care provider may want to do additional tests to assess your baby's well-being. Contact a health care provider if:  You feel fewer than 10 movements in 2 hours.  Your baby is not moving like he or she usually does. Date: ____________ Start time: ____________ Stop time: ____________ Movements: ____________ Date: ____________ Start time: ____________ Stop time: ____________ Movements: ____________ Date: ____________  Start time: ____________ Stop time: ____________ Movements: ____________ Date: ____________ Start time: ____________ Stop time: ____________ Movements: ____________ Date: ____________ Start time: ____________ Stop time: ____________ Movements: ____________ Date: ____________ Start time: ____________ Stop time: ____________ Movements: ____________ Date: ____________ Start time: ____________ Stop time: ____________ Movements: ____________ Date: ____________ Start time: ____________ Stop time: ____________ Movements: ____________ Date: ____________ Start time: ____________ Stop time: ____________ Movements: ____________ This information is not intended to replace advice given to you by your health care provider. Make sure you discuss any questions you have with your health care provider. Document Revised: 02/02/2019 Document Reviewed: 02/02/2019 Elsevier Patient Education  2020 Else[employer] Inc. Braxton Hicks Contractions Contractions of the uterus can occur throughout pregnancy, but they are not always a sign that you are in labor. You may have practice contractions called Braxton Hicks contractions. These false labor contractions are sometimes confused with true labor. What are Braxton Hicks contractions? Braxton Hicks contractions are tightening movements that occur in the muscles of the uterus before labor. Unlike true labor contractions, these contractions do not result in opening (dilation) and thinning of the cervix. Toward the end of pregnancy (32-34 weeks), Braxton Hicks contractions can happen more often and may become stronger. These contractions are sometimes difficult to tell apart from true labor because they can be very uncomfortable. You should not feel embarrassed if you go to the hospital with false labor. Sometimes, the only way to tell if you are in true labor is for your health care provider to look for changes in the cervix. The health care provider   will do a physical exam and may  monitor your contractions. If you are not in true labor, the exam should show that your cervix is not dilating and your water has not broken. If there are no other health problems associated with your pregnancy, it is completely safe for you to be sent home with false labor. You may continue to have Braxton Hicks contractions until you go into true labor. How to tell the difference between true labor and false labor True labor  Contractions last 30-70 seconds.  Contractions become very regular.  Discomfort is usually felt in the top of the uterus, and it spreads to the lower abdomen and low back.  Contractions do not go away with walking.  Contractions usually become more intense and increase in frequency.  The cervix dilates and gets thinner. False labor  Contractions are usually shorter and not as strong as true labor contractions.  Contractions are usually irregular.  Contractions are often felt in the front of the lower abdomen and in the groin.  Contractions may go away when you walk around or change positions while lying down.  Contractions get weaker and are shorter-lasting as time goes on.  The cervix usually does not dilate or become thin. Follow these instructions at home:   Take over-the-counter and prescription medicines only as told by your health care provider.  Keep up with your usual exercises and follow other instructions from your health care provider.  Eat and drink lightly if you think you are going into labor.  If Braxton Hicks contractions are making you uncomfortable: ? Change your position from lying down or resting to walking, or change from walking to resting. ? Sit and rest in a tub of warm water. ? Drink enough fluid to keep your urine pale yellow. Dehydration may cause these contractions. ? Do slow and deep breathing several times an hour.  Keep all follow-up prenatal visits as told by your health care provider. This is important. Contact a  health care provider if:  You have a fever.  You have continuous pain in your abdomen. Get help right away if:  Your contractions become stronger, more regular, and closer together.  You have fluid leaking or gushing from your vagina.  You pass blood-tinged mucus (bloody show).  You have bleeding from your vagina.  You have low back pain that you never had before.  You feel your baby's head pushing down and causing pelvic pressure.  Your baby is not moving inside you as much as it used to. Summary  Contractions that occur before labor are called Braxton Hicks contractions, false labor, or practice contractions.  Braxton Hicks contractions are usually shorter, weaker, farther apart, and less regular than true labor contractions. True labor contractions usually become progressively stronger and regular, and they become more frequent.  Manage discomfort from Braxton Hicks contractions by changing position, resting in a warm bath, drinking plenty of water, or practicing deep breathing. This information is not intended to replace advice given to you by your health care provider. Make sure you discuss any questions you have with your health care provider. Document Revised: 05/28/2017 Document Reviewed: 10/29/2016 Elsevier Patient Education  2020 Elsevier Inc.  

## 2019-11-15 NOTE — Progress Notes (Signed)
   PRENATAL VISIT NOTE  Subjective:  Shari Davidson is a 29 y.o. G2P0010 at [redacted]w[redacted]d being seen today for ongoing prenatal care.  She is currently monitored for the following issues for this high-risk pregnancy and has Migraine headache; MYOPIA; RHINITIS, ALLERGIC; Asthma; Constipation; Depression, major, recurrent, moderate (HCC); Supervision of normal first pregnancy, antepartum; Sickle cell trait (HCC); and Gestational diabetes on their problem list.  Patient reports no complaints.  Contractions: Irritability. Vag. Bleeding: None.  Movement: Present. Denies leaking of fluid.   The following portions of the patient's history were reviewed and updated as appropriate: allergies, current medications, past family history, past medical history, past social history, past surgical history and problem list.   Objective:   Vitals:   11/15/19 1433  BP: 119/77  Pulse: 80  Weight: 152 lb (68.9 kg)    Fetal Status: Fetal Heart Rate (bpm): 144   Movement: Present  Presentation: Vertex  General:  Alert, oriented and cooperative. Patient is in no acute distress.  Skin: Skin is warm and dry. No rash noted.   Cardiovascular: Normal heart rate noted  Respiratory: Normal respiratory effort, no problems with respiration noted  Abdomen: Soft, gravid, appropriate for gestational age.  Pain/Pressure: Present     Pelvic: Cervical exam performed in the presence of a chaperone Dilation: Closed Effacement (%): Thick Station: -2  Extremities: Normal range of motion.  Edema: None  Mental Status: Normal mood and affect. Normal behavior. Normal judgment and thought content.   Assessment and Plan:  Pregnancy: G2P0010 at [redacted]w[redacted]d 1. Supervision of normal first pregnancy, antepartum - Culture, beta strep (group b only) - Negative GC/Chlamydia last week  2. Diet controlled gestational diabetes mellitus (GDM) in third trimester - Patient has not been routinely checking sugars x 2 weeks due to caring for  mother who had surgery  - Importance of routine CBGs discussed, patient will input in BRx - Fasting today 84 - Growth [place] tomorrow   - Planning WB, class completed, needs to see CNM and sign consent at next visit   Preterm labor symptoms and general obstetric precautions including but not limited to vaginal bleeding, contractions, leaking of fluid and fetal movement were reviewed in detail with the patient. Please refer to After Visit Summary for other counseling recommendations.   Return in about 6 days (around 11/21/2019) for LOB, In-Person with [facility] or any midwife.  Future Appointments  Date Time Provider Department Center  11/16/2019  2:30 PM [facility] NURSE [facility]  11/16/2019  2:30 PM [facility] US3 [facility]  11/21/2019  3:35 PM Marylene Land, CNM [facility]  11/28/2019  9:35 AM Raelyn Mora, CNM [facility]  12/06/2019  2:15 PM Sharyon Cable, CNM [facility]    Vonzella Nipple, PA-C

## 2019-11-16 ENCOUNTER — Ambulatory Visit: Payer: Medicaid Other | Attending: Obstetrics & Gynecology

## 2019-11-16 ENCOUNTER — Ambulatory Visit: Payer: Medicaid Other | Admitting: *Deleted

## 2019-11-16 DIAGNOSIS — O24410 Gestational diabetes mellitus in pregnancy, diet controlled: Secondary | ICD-10-CM

## 2019-11-16 DIAGNOSIS — Z3400 Encounter for supervision of normal first pregnancy, unspecified trimester: Secondary | ICD-10-CM | POA: Insufficient documentation

## 2019-11-16 DIAGNOSIS — J45909 Unspecified asthma, uncomplicated: Secondary | ICD-10-CM | POA: Diagnosis not present

## 2019-11-16 DIAGNOSIS — Z3A36 36 weeks gestation of pregnancy: Secondary | ICD-10-CM

## 2019-11-16 DIAGNOSIS — Z362 Encounter for other antenatal screening follow-up: Secondary | ICD-10-CM

## 2019-11-16 DIAGNOSIS — O99891 Other specified diseases and conditions complicating pregnancy: Secondary | ICD-10-CM | POA: Diagnosis not present

## 2019-11-19 LAB — CULTURE, BETA STREP (GROUP B ONLY): Strep Gp B Culture: NEGATIVE

## 2019-11-21 ENCOUNTER — Other Ambulatory Visit: Payer: Self-pay

## 2019-11-21 ENCOUNTER — Ambulatory Visit (INDEPENDENT_AMBULATORY_CARE_PROVIDER_SITE_OTHER): Payer: Medicaid Other | Admitting: Student

## 2019-11-21 DIAGNOSIS — O24410 Gestational diabetes mellitus in pregnancy, diet controlled: Secondary | ICD-10-CM

## 2019-11-21 DIAGNOSIS — D573 Sickle-cell trait: Secondary | ICD-10-CM

## 2019-11-21 DIAGNOSIS — Z3A37 37 weeks gestation of pregnancy: Secondary | ICD-10-CM

## 2019-11-21 DIAGNOSIS — Z3400 Encounter for supervision of normal first pregnancy, unspecified trimester: Secondary | ICD-10-CM

## 2019-11-21 NOTE — Patient Instructions (Addendum)
-check blood sugar in the morning and 2 hours after each meal -put in BabyRx -send waterbirth certificate to [email or web address]    Tips for Eating Away From Home If You Have Diabetes Controlling your blood sugar (glucose) levels can be challenging when you do not prepare your own meals. The following tips can help you manage your diabetes when you eat away from home. If you have questions or if you need help, work with your health care provider or diet and nutrition specialist (dietitian). Planning ahead Plan ahead if you know you will be eating away from home:  Try to eat your meals and snacks at about the same time each day. If you know your meal is going to be later than normal, make sure you have a small snack. Being very hungry can cause you to make unhealthy food choices.  Make a list of restaurants near you that offer healthy choices. If a restaurant has a carry-out menu, take the menu home and plan what you will order ahead of time.  Look up the restaurant you want to eat at online. Many chain and fast-food restaurants list nutritional information online. Use this information to choose the healthiest options and to calculate how many carbohydrates will be in your meal.  Use a carbohydrate-counting book or mobile app to look up the carbohydrate content and serving size of the foods you want to eat. Free foods A "free food" is any food or drink that has less than 5 grams of carbohydrates and less than 20 calories per serving. These food are high in fiber and nutrients and low in calories, carbohydrates, and fats. Free foods include:  Non-starchy vegetables, such as carrots, broccoli, celery, lettuce, or green beans.  Non-sugar drinks, such as water, unsweetened coffee, or unsweetened tea.  Low-calorie salad dressings.  Sugar-free gelatin. Starting meals with a salad full of vegetables is a healthy choice that includes a lot of free foods. Avoid high-calorie salad  toppings like bacon, cheese, and high-fat dressings. Ask for your salad dressing to be served on the side so that you dip your fork in the dressing and then in the salad. This allows you to control how much dressing you eat and still get the flavor with every bite. Choices to control carbohydrates   Ask your server to take away the bread basket or chips from your table.  Choose light yogurt or [place] yogurt instead of non-fat sweetened yogurt.  Order fresh fruit. A salad bar often offers fresh fruit choices. Avoid canned fruit because it is usually packed in sugar or syrup.  Order a salad, and ask for dressing on the side.  Ask for substitutes. For example, if your meal comes with french fries, ask for a side salad or steamed veggies instead. If a meal comes with fried chicken, ask for grilled chicken instead. Beverages  Choose drinks that are low in calories and sugar, such as: ? Water. ? Unsweetened tea or coffee. ? Lowfat milk.  Avoid the following drinks: ? Alcoholic beverages. ? Regular (not diet) sodas. Other tips  If you take insulin, wait to take your insulin once your food arrives to your table. This will ensure that your insulin and your food are timed correctly.  Become familiar with serving sizes and learn to recognize how many servings are in a portion. Restaurant portions are typically two to three times larger than what you really need.  Ask your server for a to-go box at the beginning of the meal. When  your food comes, leave the amount you should have on your plate, and put the rest in the to-go box so that you are not tempted to eat too much.  Consider splitting an entree with someone and ordering a side salad.  Avoid buffets. They are typically too tempting and result in overeating. Where to find more information  American Diabetes Association: www.diabetes.org  American Association of Diabetes Educators: www.diabeteseducator.org Summary  Plan ahead when  eating away from home.  Try to eat your meals and snacks at about the same time each day. If you know your meal is going to be later than normal, make sure you have a small snack. Being very hungry can cause you to make unhealthy food choices.  Ask for substitutes. For example, if your meal comes with french fries, ask for a side salad or steamed veggies instead. If a meal comes with fried chicken, ask for grilled chicken instead.  Ask for a to-go box when you order your meal. Divide your meal before you start eating. This information is not intended to replace advice given to you by your health care provider. Make sure you discuss any questions you have with your health care provider. Document Revised: 09/23/2016 Document Reviewed: 09/23/2016 Elsevier Patient Education  [place].

## 2019-11-21 NOTE — Progress Notes (Signed)
     Reginae Wolfrey RN 11/21/19 

## 2019-11-21 NOTE — Progress Notes (Addendum)
Patient ID: Shari Davidson, female   DOB: 09-Feb-1991, 29 y.o.   MRN: [id number]   PRENATAL VISIT NOTE  Subjective:  Shari Davidson is a 29 y.o. G2P0010 at [redacted]w[redacted]d being seen today for ongoing prenatal care.  She is currently monitored for the following issues for this low-risk pregnancy and has Migraine headache; MYOPIA; RHINITIS, ALLERGIC; Asthma; Constipation; Depression, major, recurrent, moderate (HCC); Supervision of normal first pregnancy, antepartum; Sickle cell trait (HCC); and Gestational diabetes on their problem list.  Patient reports no complaints. She has not been checking her blood sugar consistently but reports that she is eating well.  Contractions: Irritability. Vag. Bleeding: None.  Movement: Present. Denies leaking of fluid.   The following portions of the patient's history were reviewed and updated as appropriate: allergies, current medications, past family history, past medical history, past social history, past surgical history and problem list.   Objective:   Vitals:   11/21/19 1550  BP: 120/80  Pulse: 96  Weight: 153 lb 9.6 oz (69.7 kg)    Fetal Status: Fetal Heart Rate (bpm): 136 Fundal Height: 37 cm Movement: Present     General:  Alert, oriented and cooperative. Patient is in no acute distress.  Skin: Skin is warm and dry. No rash noted.   Cardiovascular: Normal heart rate noted  Respiratory: Normal respiratory effort, no problems with respiration noted  Abdomen: Soft, gravid, appropriate for gestational age.  Pain/Pressure: Present     Pelvic: Cervical exam deferred        Extremities: Normal range of motion.  Edema: None  Mental Status: Normal mood and affect. Normal behavior. Normal judgment and thought content.   Assessment and Plan:  Pregnancy: G2P0010 at [redacted]w[redacted]d 1. Supervision of normal first pregnancy, antepartum -Reviewed waterbirth, patient emailed me the certificate and RN will scan in. Signed consent today as well.  - Culture,  OB Urine -Patient did not want cervical exam.  -Vertex by bedside US -Reviewed last [place]; normal growth 2. Diet controlled gestational diabetes mellitus (GDM) in third trimester -reviewed that we do not know if her sugars are within range or not. She is at risk of fetal demise with uncontrolled blood sugars, and will be ineligible for waterbirth if she does not have well controlled blood sugars. She will also need to be induced if she is uncontrolled.  Patient agrees to start checking blood sugars at home and have follow up with MD.  - Culture, OB Urine  Term labor symptoms and general obstetric precautions including but not limited to vaginal bleeding, contractions, leaking of fluid and fetal movement were reviewed in detail with the patient. Please refer to After Visit Summary for other counseling recommendations.   Return please have patient seen MD or DO for the next two weeks.  Future Appointments  Date Time Provider Department Center  11/28/2019  9:35 AM Hermina Staggers, MD [facility]  12/06/2019 10:35 AM Conan Bowens, MD [facility]    Marylene Land, CNM

## 2019-11-23 ENCOUNTER — Telehealth: Payer: Medicaid Other | Admitting: Obstetrics and Gynecology

## 2019-11-23 LAB — CULTURE, OB URINE

## 2019-11-23 LAB — URINE CULTURE, OB REFLEX

## 2019-11-28 ENCOUNTER — Encounter: Payer: Self-pay | Admitting: Obstetrics and Gynecology

## 2019-11-28 ENCOUNTER — Ambulatory Visit (INDEPENDENT_AMBULATORY_CARE_PROVIDER_SITE_OTHER): Payer: Medicaid Other | Admitting: Obstetrics and Gynecology

## 2019-11-28 ENCOUNTER — Other Ambulatory Visit: Payer: Self-pay

## 2019-11-28 VITALS — BP 121/81 | HR 86 | Wt 156.7 lb

## 2019-11-28 DIAGNOSIS — Z3400 Encounter for supervision of normal first pregnancy, unspecified trimester: Secondary | ICD-10-CM

## 2019-11-28 DIAGNOSIS — O24410 Gestational diabetes mellitus in pregnancy, diet controlled: Secondary | ICD-10-CM

## 2019-11-28 DIAGNOSIS — Z3A38 38 weeks gestation of pregnancy: Secondary | ICD-10-CM

## 2019-11-28 LAB — POCT URINALYSIS DIP (DEVICE)
Bilirubin Urine: NEGATIVE
Glucose, UA: NEGATIVE mg/dL
Hgb urine dipstick: NEGATIVE
Ketones, ur: NEGATIVE mg/dL
Leukocytes,Ua: NEGATIVE
Nitrite: NEGATIVE
Protein, ur: NEGATIVE mg/dL
Specific Gravity, Urine: 1.02 (ref 1.005–1.030)
Urobilinogen, UA: 0.2 mg/dL (ref 0.0–1.0)
pH: 6.5 (ref 5.0–8.0)

## 2019-11-28 NOTE — Progress Notes (Signed)
Subjective:  [place] Lealer Marsland is a 29 y.o. G2P0010 at [redacted]w[redacted]d being seen today for ongoing prenatal care.  She is currently monitored for the following issues for this high-risk pregnancy and has Migraine headache; MYOPIA; RHINITIS, ALLERGIC; Asthma; Depression, major, recurrent, moderate (HCC); Supervision of normal first pregnancy, antepartum; Sickle cell trait (HCC); and Gestational diabetes on their problem list.  Patient reports general discomforts of pregnancy.  Contractions: Irritability. Vag. Bleeding: None.  Movement: Present. Denies leaking of fluid.   The following portions of the patient's history were reviewed and updated as appropriate: allergies, current medications, past family history, past medical history, past social history, past surgical history and problem list. Problem list updated.  Objective:   Vitals:   11/28/19 0945  BP: 121/81  Pulse: 86  Weight: 156 lb 11.2 oz (71.1 kg)    Fetal Status:     Movement: Present     General:  Alert, oriented and cooperative. Patient is in no acute distress.  Skin: Skin is warm and dry. No rash noted.   Cardiovascular: Normal heart rate noted  Respiratory: Normal respiratory effort, no problems with respiration noted  Abdomen: Soft, gravid, appropriate for gestational age. Pain/Pressure: Present     Pelvic:  Cervical exam performed        Extremities: Normal range of motion.  Edema: None  Mental Status: Normal mood and affect. Normal behavior. Normal judgment and thought content.   Urinalysis:      Assessment and Plan:  Pregnancy: G2P0010 at [redacted]w[redacted]d  1. Supervision of normal first pregnancy, antepartum Stable Labor precautions   2. Diet controlled gestational diabetes mellitus (GDM) in third trimester CBG's in goal range Growth 28 % Continue with diet.   Term labor symptoms and general obstetric precautions including but not limited to vaginal bleeding, contractions, leaking of fluid and fetal movement were  reviewed in detail with the patient. Please refer to After Visit Summary for other counseling recommendations.  Return in about 1 week (around 12/05/2019) for  face to face, with midwife. desires water birth.   Hermina Staggers, MD

## 2019-11-28 NOTE — Progress Notes (Signed)
     Fleet Contras RN 11/28/19

## 2019-11-28 NOTE — Patient Instructions (Signed)
Third Trimester of Pregnancy The third trimester is from week 28 through week 40 (months 7 through 9). The third trimester is a time when the unborn baby (fetus) is growing rapidly. At the end of the ninth month, the fetus is about 20 inches in length and weighs 6-10 pounds. Body changes during your third trimester Your body will continue to go through many changes during pregnancy. The changes vary from woman to woman. During the third trimester:  Your weight will continue to increase. You can expect to gain 25-35 pounds (11-16 kg) by the end of the pregnancy.  You may begin to get stretch marks on your hips, abdomen, and breasts.  You may urinate more often because the fetus is moving lower into your pelvis and pressing on your bladder.  You may develop or continue to have heartburn. This is caused by increased hormones that slow down muscles in the digestive tract.  You may develop or continue to have constipation because increased hormones slow digestion and cause the muscles that push waste through your intestines to relax.  You may develop hemorrhoids. These are swollen veins (varicose veins) in the rectum that can itch or be painful.  You may develop swollen, bulging veins (varicose veins) in your legs.  You may have increased body aches in the pelvis, back, or thighs. This is due to weight gain and increased hormones that are relaxing your joints.  You may have changes in your hair. These can include thickening of your hair, rapid growth, and changes in texture. Some women also have hair loss during or after pregnancy, or hair that feels dry or thin. Your hair will most likely return to normal after your baby is born.  Your breasts will continue to grow and they will continue to become tender. A yellow fluid (colostrum) may leak from your breasts. This is the first milk you are producing for your baby.  Your belly button may stick out.  You may notice more swelling in your hands,  face, or ankles.  You may have increased tingling or numbness in your hands, arms, and legs. The skin on your belly may also feel numb.  You may feel short of breath because of your expanding uterus.  You may have more problems sleeping. This can be caused by the size of your belly, increased need to urinate, and an increase in your body's metabolism.  You may notice the fetus "dropping," or moving lower in your abdomen (lightening).  You may have increased vaginal discharge.  You may notice your joints feel loose and you may have pain around your pelvic bone. What to expect at prenatal visits You will have prenatal exams every 2 weeks until week 36. Then you will have weekly prenatal exams. During a routine prenatal visit:  You will be weighed to make sure you and the baby are growing normally.  Your blood pressure will be taken.  Your abdomen will be measured to track your baby's growth.  The fetal heartbeat will be listened to.  Any test results from the previous visit will be discussed.  You may have a cervical check near your due date to see if your cervix has softened or thinned (effaced).  You will be tested for Group B streptococcus. This happens between 35 and 37 weeks. Your health care provider may ask you:  What your birth plan is.  How you are feeling.  If you are feeling the baby move.  If you have had any abnormal   symptoms, such as leaking fluid, bleeding, severe headaches, or abdominal cramping.  If you are using any tobacco products, including cigarettes, chewing tobacco, and electronic cigarettes.  If you have any questions. Other tests or screenings that may be performed during your third trimester include:  Blood tests that check for low iron levels (anemia).  Fetal testing to check the health, activity level, and growth of the fetus. Testing is done if you have certain medical conditions or if there are problems during the pregnancy.  Nonstress test  (NST). This test checks the health of your baby to make sure there are no signs of problems, such as the baby not getting enough oxygen. During this test, a belt is placed around your belly. The baby is made to move, and its heart rate is monitored during movement. What is false labor? False labor is a condition in which you feel small, irregular tightenings of the muscles in the womb (contractions) that usually go away with rest, changing position, or drinking water. These are called Braxton Hicks contractions. Contractions may last for hours, days, or even weeks before true labor sets in. If contractions come at regular intervals, become more frequent, increase in intensity, or become painful, you should see your health care provider. What are the signs of labor?  Abdominal cramps.  Regular contractions that start at 10 minutes apart and become stronger and more frequent with time.  Contractions that start on the top of the uterus and spread down to the lower abdomen and back.  Increased pelvic pressure and dull back pain.  A watery or bloody mucus discharge that comes from the vagina.  Leaking of amniotic fluid. This is also known as your "water breaking." It could be a slow trickle or a gush. Let your health care provider know if it has a color or strange odor. If you have any of these signs, call your health care provider right away, even if it is before your due date. Follow these instructions at home: Medicines  Follow your health care provider's instructions regarding medicine use. Specific medicines may be either safe or unsafe to take during pregnancy.  Take a prenatal vitamin that contains at least 600 micrograms (mcg) of folic acid.  If you develop constipation, try taking a stool softener if your health care provider approves. Eating and drinking   Eat a balanced diet that includes fresh fruits and vegetables, whole grains, good sources of protein such as meat, eggs, or tofu,  and low-fat dairy. Your health care provider will help you determine the amount of weight gain that is right for you.  Avoid raw meat and uncooked cheese. These carry germs that can cause birth defects in the baby.  If you have low calcium intake from food, talk to your health care provider about whether you should take a daily calcium supplement.  Eat four or five small meals rather than three large meals a day.  Limit foods that are high in fat and processed sugars, such as fried and sweet foods.  To prevent constipation: ? Drink enough fluid to keep your urine clear or pale yellow. ? Eat foods that are high in fiber, such as fresh fruits and vegetables, whole grains, and beans. Activity  Exercise only as directed by your health care provider. Most women can continue their usual exercise routine during pregnancy. Try to exercise for 30 minutes at least 5 days a week. Stop exercising if you experience uterine contractions.  Avoid heavy lifting.  Do   not exercise in extreme heat or humidity, or at high altitudes.  Wear low-heel, comfortable shoes.  Practice good posture.  You may continue to have sex unless your health care provider tells you otherwise. Relieving pain and discomfort  Take frequent breaks and rest with your legs elevated if you have leg cramps or low back pain.  Take warm sitz baths to soothe any pain or discomfort caused by hemorrhoids. Use hemorrhoid cream if your health care provider approves.  Wear a good support bra to prevent discomfort from breast tenderness.  If you develop varicose veins: ? Wear support pantyhose or compression stockings as told by your healthcare provider. ? Elevate your feet for 15 minutes, 3-4 times a day. Prenatal care  Write down your questions. Take them to your prenatal visits.  Keep all your prenatal visits as told by your health care provider. This is important. Safety  Wear your seat belt at all times when driving.  Make  a list of emergency phone numbers, including numbers for family, friends, the hospital, and police and fire departments. General instructions  Avoid cat litter boxes and soil used by cats. These carry germs that can cause birth defects in the baby. If you have a cat, ask someone to clean the litter box for you.  Do not travel far distances unless it is absolutely necessary and only with the approval of your health care provider.  Do not use hot tubs, steam rooms, or saunas.  Do not drink alcohol.  Do not use any products that contain nicotine or tobacco, such as cigarettes and e-cigarettes. If you need help quitting, ask your health care provider.  Do not use any medicinal herbs or unprescribed drugs. These chemicals affect the formation and growth of the baby.  Do not douche or use tampons or scented sanitary pads.  Do not cross your legs for long periods of time.  To prepare for the arrival of your baby: ? Take prenatal classes to understand, practice, and ask questions about labor and delivery. ? Make a trial run to the hospital. ? Visit the hospital and tour the maternity area. ? Arrange for maternity or paternity leave through employers. ? Arrange for family and friends to take care of pets while you are in the hospital. ? Purchase a rear-facing car seat and make sure you know how to install it in your car. ? Pack your hospital bag. ? Prepare the baby's nursery. Make sure to remove all pillows and stuffed animals from the baby's crib to prevent suffocation.  Visit your dentist if you have not gone during your pregnancy. Use a soft toothbrush to brush your teeth and be gentle when you floss. Contact a health care provider if:  You are unsure if you are in labor or if your water has broken.  You become dizzy.  You have mild pelvic cramps, pelvic pressure, or nagging pain in your abdominal area.  You have lower back pain.  You have persistent nausea, vomiting, or  diarrhea.  You have an unusual or bad smelling vaginal discharge.  You have pain when you urinate. Get help right away if:  Your water breaks before 37 weeks.  You have regular contractions less than 5 minutes apart before 37 weeks.  You have a fever.  You are leaking fluid from your vagina.  You have spotting or bleeding from your vagina.  You have severe abdominal pain or cramping.  You have rapid weight loss or weight gain.  You have   shortness of breath with chest pain.  You notice sudden or extreme swelling of your face, hands, ankles, feet, or legs.  Your baby makes fewer than 10 movements in 2 hours.  You have severe headaches that do not go away when you take medicine.  You have vision changes. Summary  The third trimester is from week 28 through week 40, months 7 through 9. The third trimester is a time when the unborn baby (fetus) is growing rapidly.  During the third trimester, your discomfort may increase as you and your baby continue to gain weight. You may have abdominal, leg, and back pain, sleeping problems, and an increased need to urinate.  During the third trimester your breasts will keep growing and they will continue to become tender. A yellow fluid (colostrum) may leak from your breasts. This is the first milk you are producing for your baby.  False labor is a condition in which you feel small, irregular tightenings of the muscles in the womb (contractions) that eventually go away. These are called Braxton Hicks contractions. Contractions may last for hours, days, or even weeks before true labor sets in.  Signs of labor can include: abdominal cramps; regular contractions that start at 10 minutes apart and become stronger and more frequent with time; watery or bloody mucus discharge that comes from the vagina; increased pelvic pressure and dull back pain; and leaking of amniotic fluid. This information is not intended to replace advice given to you by your  health care provider. Make sure you discuss any questions you have with your health care provider. Document Revised: 10/06/2018 Document Reviewed: 07/21/2016 Elsevier Patient Education  2020 [employer]  

## 2019-11-29 ENCOUNTER — Encounter (HOSPITAL_COMMUNITY): Payer: Self-pay | Admitting: Family Medicine

## 2019-11-29 ENCOUNTER — Inpatient Hospital Stay (HOSPITAL_COMMUNITY)
Admission: AD | Admit: 2019-11-29 | Discharge: 2019-11-29 | Disposition: A | Discharge status: Home / Self Care | Payer: Medicaid Other | Attending: Family Medicine | Admitting: Family Medicine

## 2019-11-29 ENCOUNTER — Telehealth: Payer: Self-pay | Admitting: Medical

## 2019-11-29 ENCOUNTER — Other Ambulatory Visit: Payer: Self-pay

## 2019-11-29 DIAGNOSIS — Z3400 Encounter for supervision of normal first pregnancy, unspecified trimester: Secondary | ICD-10-CM

## 2019-11-29 DIAGNOSIS — O471 False labor at or after 37 completed weeks of gestation: Secondary | ICD-10-CM | POA: Diagnosis present

## 2019-11-29 DIAGNOSIS — Z3A38 38 weeks gestation of pregnancy: Secondary | ICD-10-CM | POA: Diagnosis not present

## 2019-11-29 DIAGNOSIS — O479 False labor, unspecified: Secondary | ICD-10-CM

## 2019-11-29 MED ORDER — OXYCODONE HCL 5 MG PO TABS
5.0000 mg | ORAL_TABLET | Freq: Once | ORAL | Status: AC
  Administered 2019-11-29: 5 mg via ORAL
  Filled 2019-11-29: qty 1

## 2019-11-29 NOTE — MAU Note (Signed)
I have communicated with Dr Crissie Reese and reviewed vital signs:  Vitals:   11/29/19 2045 11/29/19 2237  BP: 124/72 124/68  Pulse: 80   Resp:    Temp:    SpO2: 100%     Vaginal exam:  Dilation: 1 Effacement (%): 70 Cervical Position: Posterior Station: -2 Presentation: Vertex Exam by:: TLY,   Also reviewed contraction pattern and that non-stress test is reactive.  It has been documented that patient is contracting every 3-5  minutes with no cervical change over 1.25  hours not indicating active labor.  Patient denies any other complaints.  Based on this report provider has given order for discharge.  A discharge order and diagnosis entered by a provider.   Labor discharge instructions reviewed with patient.

## 2019-11-29 NOTE — Progress Notes (Signed)
Shari Davidson is a 29 y.o. G2P0010 female at [redacted]w[redacted]d  RN Labor check, not seen by provider SVE by RN: Dilation: 1 Effacement (%): 70 Station: -2 Exam by:: TLY NST: FHR baseline 145 bpm, Variability: moderate, Accelerations:present, Decelerations:  Absent= Cat I/Reactive Toco: regular, every 4 minutes  Patient with minimal cervical change over one hour, still 1cm and posterior, given oxycodone for comfort prior to discharge D/C home  [name] MD/MPH 11/29/2019 10:02 PM

## 2019-11-29 NOTE — MAU Note (Addendum)
Headache since last night. Tylenol helped this am but took nap and h/a was back. Did not take more Tylenol. Cramping all day in upper abdomen and sometimes more to R side. Denies vag bleeding or LOF. Cervix closed yesterday in office

## 2019-11-29 NOTE — Discharge Instructions (Signed)

## 2019-11-30 ENCOUNTER — Other Ambulatory Visit: Payer: Self-pay

## 2019-11-30 ENCOUNTER — Inpatient Hospital Stay (EMERGENCY_DEPARTMENT_HOSPITAL)
Admission: AD | Admit: 2019-11-30 | Discharge: 2019-11-30 | Disposition: A | Discharge status: Home / Self Care | Payer: Medicaid Other | Source: Home / Self Care | Attending: Obstetrics & Gynecology | Admitting: Obstetrics & Gynecology

## 2019-11-30 ENCOUNTER — Encounter (HOSPITAL_COMMUNITY): Payer: Self-pay | Admitting: Obstetrics & Gynecology

## 2019-11-30 ENCOUNTER — Encounter: Payer: Self-pay | Admitting: Medical

## 2019-11-30 DIAGNOSIS — Z3400 Encounter for supervision of normal first pregnancy, unspecified trimester: Secondary | ICD-10-CM

## 2019-11-30 DIAGNOSIS — O24410 Gestational diabetes mellitus in pregnancy, diet controlled: Secondary | ICD-10-CM

## 2019-11-30 DIAGNOSIS — Z3A38 38 weeks gestation of pregnancy: Secondary | ICD-10-CM

## 2019-11-30 DIAGNOSIS — Z3689 Encounter for other specified antenatal screening: Secondary | ICD-10-CM

## 2019-11-30 DIAGNOSIS — O479 False labor, unspecified: Secondary | ICD-10-CM

## 2019-11-30 MED ORDER — ZOLPIDEM TARTRATE 5 MG PO TABS: 5.0000 mg | ORAL_TABLET | Freq: Every evening | ORAL | 0 refills | Status: DC | PRN

## 2019-11-30 MED ORDER — ZOLPIDEM TARTRATE 5 MG PO TABS
5.0000 mg | ORAL_TABLET | Freq: Every evening | ORAL | 0 refills | Status: DC | PRN
Start: 2019-11-30 — End: 2019-11-30

## 2019-11-30 NOTE — MAU Provider Note (Signed)
First Provider Initiated Contact with Patient 11/30/19 1335    S: Ms. Shari Davidson is a 29 y.o. G2P0010 at [redacted]w[redacted]d  who presents to MAU today complaining contractions q 2-5 minutes since yesterday. She denies vaginal bleeding. She denies LOF. She reports normal fetal movement.    O: BP 127/68   Pulse 85   Temp 98.7 F (37.1 C) (Oral)   Resp 20   Ht 5\' 8"  (1.727 m)   Wt 71.7 kg   LMP 03/10/2019 (Exact Date)   SpO2 99%   BMI 24.02 kg/m    GENERAL: Well-developed, well-nourished female in no acute distress .  HEAD: Normocephalic, atraumatic.  CHEST: Normal effort of breathing, regular heart rate ABDOMEN: Soft, nontender, gravid  Cervical exam:  Dilation: 3 Effacement (%): 100 Cervical Position: Middle Station: -2 Presentation: Vertex Exam by:: T Lytle RN   Fetal Monitoring: Baseline: 134 Variability: Mod Accelerations: POS 15 x 15 Decelerations: None Contractions: Irregular, q 2-5 min, palpate mild  A: [facility]UP at [redacted]w[redacted]d with desires waterbirth Discharged from MAU last night at 1cm Dilation unchanged but effacement now 100% during labor evaluation in MAU At bedside to discuss inability to offer augmentation at [redacted]w[redacted]d  Patient offered additional 90 min of monitoring vs. discharge home to observe for SOL vs. therapeutic rest. Patient requests therapeutic rest  Meds ordered this encounter  Medications  . zolpidem (AMBIEN) 5 MG tablet    Sig: Take 1 tablet (5 mg total) by mouth at bedtime as needed for up to 3 doses for sleep.    Dispense:  3 tablet    Refill:  0    Order Specific Question:   Supervising Provider    Answer:   [redacted]w[redacted]d [[id number]]    P: Cat I tracing Early labor Discharge home in stable condition Given handouts on [employer] at home  Hugo, WIEN 11/30/2019 2:15 PM

## 2019-11-30 NOTE — Discharge Instructions (Signed)
[employer]  See separate Psychologist, occupational is your body's natural process of moving your baby and other structures, including the placenta and umbilical cord, out of your uterus. There are three stages of labor. How long each stage lasts is different for every woman. But certain events happen during each stage that are the same for everyone.  The first stage starts when true labor begins. This stage ends when your cervix, which is the opening from your uterus into your vagina, is completely open (dilated).  The second stage begins when your cervix is fully dilated and you start pushing. This stage ends when your baby is born.  The third stage is the delivery of the organ that nourished your baby during pregnancy (placenta). First stage of labor As your due date gets closer, you may start to notice certain physical changes that mean labor is going to start soon. You may feel that your baby has dropped lower into your pelvis. You may experience irregular, often painless, contractions that go away when you walk around or lie down ([employer] contractions). This is also called false labor. The first stage of labor begins when you start having contractions that come at regular (evenly spaced) intervals and your cervix starts to get thinner and wider in preparation for your baby to pass through. Birth care providers measure the dilation of your cervix in centimeters (cm). One centimeter is a little less than one-half of an inch. The first stage ends when your cervix is dilated to 10 cm. The first stage of labor is divided into three phases:  Early phase.  Active phase.  Transitional phase. The length of the first stage of labor varies. It may be longer if this is your first pregnancy. You may spend most of this stage at home trying to relax and stay comfortable. How does this affect me? During the first stage of labor, you will move through three phases. What  happens in the early phase?  You will start to have regular contractions that last 30-60 seconds. Contractions may come every 5-20 minutes. Keep track of your contractions and call your birth care provider.  Your water may break during this phase.  You may notice a clear or slightly bloody discharge of mucus (mucus plug) from your vagina.  Your cervix will dilate to 3-6 cm. What happens in the active phase? The active phase usually lasts 3-5 hours. You may go to the hospital or birth center around this time. During the active phase:  Your contractions will become stronger, longer, and more uncomfortable.  Your contractions may last 45-90 seconds and come every 3-5 minutes.  You may feel lower back pain.  Your birth care providers may examine your cervix and feel your belly to find the position of your baby.  You may have a monitor strapped to your belly to measure your contractions and your baby's heart rate.  You may start using your pain management options.  Your cervix may be dilated to 6 cm and may start to dilate more quickly. What happens in the transitional phase? The transitional phase typically lasts from 30 minutes to 2 hours. At the end of this phase, your cervix will be fully dilated to 10 cm. During the transitional phase:  Contractions will get stronger and longer.  Contractions may last 60-90 seconds and come less than 2 minutes apart.  You may feel hot flashes, chills, or nausea. How does this affect my  baby? During the first stage of labor, your baby will gradually move down into your birth canal. Follow these instructions at home and in the hospital or birth center:   When labor first begins, try to stay calm. You are still in the early phase. If it is night, try to get some sleep. If it is day, try to relax and save your energy. You may want to make some calls and get ready to go to the hospital or birth center.  When you are in the early phase, try these  methods to help ease discomfort: ? Deep breathing and muscle relaxation. ? Taking a walk. ? Taking a warm bath or shower.  Drink some fluids and have a light snack if you feel like it.  Keep track of your contractions.  Based on the plan you created with your birth care provider, call when your contractions indicate it is time.  If your water breaks, note the time, color, and odor of the fluid.  When you are in the active phase, do your breathing exercises and rely on your support people and your team of birth care providers. Contact a health care provider if:  Your contractions are strong and regular.  You have lower back pain or cramping.  Your water breaks.  You lose your mucus plug. Get help right away if you:  Have a severe headache that does not go away.  Have changes in your vision.  Have severe pain in your upper belly.  Do not feel the baby move.  Have bright red bleeding. Summary  The first stage of labor starts when true labor begins, and it ends when your cervix is dilated to 10 cm.  The first stage of labor has three phases: early, active, and transitional.  Your baby moves into the birth canal during the first stage of labor.  You may have contractions that become stronger and longer. You may also lose your mucus plug and have your water break.  Call your birth care provider when your contractions are frequent and strong enough to go to the hospital or birth center. This information is not intended to replace advice given to you by your health care provider. Make sure you discuss any questions you have with your health care provider. Document Revised: 10/06/2018 Document Reviewed: 08/29/2017 Elsevier Patient Education  2020 [email or web address] outs

## 2019-11-30 NOTE — MAU Note (Signed)
Pt presents with c/o regular ctxs.  Denies VB or LOF.  Reports +FM.  Seen in MAU last night for labor eval, cervix 1cm.

## 2019-12-01 ENCOUNTER — Inpatient Hospital Stay (HOSPITAL_COMMUNITY): Payer: Medicaid Other | Admitting: Anesthesiology

## 2019-12-01 ENCOUNTER — Encounter (HOSPITAL_COMMUNITY): Payer: Self-pay | Admitting: Obstetrics & Gynecology

## 2019-12-01 ENCOUNTER — Encounter (HOSPITAL_COMMUNITY)
Admission: AD | Disposition: A | Discharge status: Home / Self Care | Payer: Self-pay | Source: Home / Self Care | Attending: Obstetrics & Gynecology

## 2019-12-01 ENCOUNTER — Inpatient Hospital Stay (HOSPITAL_COMMUNITY)
Admission: AD | Admit: 2019-12-01 | Discharge: 2019-12-03 | DRG: 788 | Disposition: A | Discharge status: Home / Self Care | Payer: Medicaid Other | Attending: Obstetrics & Gynecology | Admitting: Obstetrics & Gynecology

## 2019-12-01 DIAGNOSIS — J45909 Unspecified asthma, uncomplicated: Secondary | ICD-10-CM | POA: Diagnosis present

## 2019-12-01 DIAGNOSIS — O24420 Gestational diabetes mellitus in childbirth, diet controlled: Principal | ICD-10-CM | POA: Diagnosis present

## 2019-12-01 DIAGNOSIS — Z87891 Personal history of nicotine dependence: Secondary | ICD-10-CM

## 2019-12-01 DIAGNOSIS — Z3400 Encounter for supervision of normal first pregnancy, unspecified trimester: Secondary | ICD-10-CM

## 2019-12-01 DIAGNOSIS — D573 Sickle-cell trait: Secondary | ICD-10-CM | POA: Diagnosis present

## 2019-12-01 DIAGNOSIS — Z23 Encounter for immunization: Secondary | ICD-10-CM | POA: Diagnosis not present

## 2019-12-01 DIAGNOSIS — Z20822 Contact with and (suspected) exposure to covid-19: Secondary | ICD-10-CM | POA: Diagnosis present

## 2019-12-01 DIAGNOSIS — O9952 Diseases of the respiratory system complicating childbirth: Secondary | ICD-10-CM | POA: Diagnosis present

## 2019-12-01 DIAGNOSIS — G43909 Migraine, unspecified, not intractable, without status migrainosus: Secondary | ICD-10-CM | POA: Diagnosis present

## 2019-12-01 DIAGNOSIS — Z3A38 38 weeks gestation of pregnancy: Secondary | ICD-10-CM

## 2019-12-01 DIAGNOSIS — O9902 Anemia complicating childbirth: Secondary | ICD-10-CM | POA: Diagnosis present

## 2019-12-01 DIAGNOSIS — O34219 Maternal care for unspecified type scar from previous cesarean delivery: Secondary | ICD-10-CM

## 2019-12-01 DIAGNOSIS — O26893 Other specified pregnancy related conditions, third trimester: Secondary | ICD-10-CM | POA: Diagnosis present

## 2019-12-01 DIAGNOSIS — O24419 Gestational diabetes mellitus in pregnancy, unspecified control: Secondary | ICD-10-CM | POA: Diagnosis present

## 2019-12-01 DIAGNOSIS — Z3480 Encounter for supervision of other normal pregnancy, unspecified trimester: Secondary | ICD-10-CM

## 2019-12-01 LAB — CBC
HCT: 32.3 % — ABNORMAL LOW (ref 36.0–46.0)
Hemoglobin: 10.9 g/dL — ABNORMAL LOW (ref 12.0–15.0)
MCH: 32.6 pg (ref 26.0–34.0)
MCHC: 33.7 g/dL (ref 30.0–36.0)
MCV: 96.7 fL (ref 80.0–100.0)
Platelets: 236 10*3/uL (ref 150–400)
RBC: 3.34 MIL/uL — ABNORMAL LOW (ref 3.87–5.11)
RDW: 11.9 % (ref 11.5–15.5)
WBC: 10.9 10*3/uL — ABNORMAL HIGH (ref 4.0–10.5)
nRBC: 0 % (ref 0.0–0.2)

## 2019-12-01 LAB — TYPE AND SCREEN
ABO/RH(D): O POS
Antibody Screen: NEGATIVE

## 2019-12-01 LAB — ABO/RH: ABO/RH(D): O POS

## 2019-12-01 LAB — GLUCOSE, CAPILLARY
Glucose-Capillary: 89 mg/dL (ref 70–99)
Glucose-Capillary: 114 mg/dL — ABNORMAL HIGH (ref 70–99)

## 2019-12-01 LAB — SARS CORONAVIRUS 2 BY RT PCR (HOSPITAL ORDER, PERFORMED IN ~~LOC~~ HOSPITAL LAB): SARS Coronavirus 2: NEGATIVE

## 2019-12-01 SURGERY — Surgical Case
Anesthesia: Epidural

## 2019-12-01 MED ORDER — LACTATED RINGERS AMNIOINFUSION: INTRAVENOUS | Status: DC

## 2019-12-01 MED ORDER — DIBUCAINE (PERIANAL) 1 % EX OINT: 1.0000 "application " | TOPICAL_OINTMENT | CUTANEOUS | Status: DC | PRN

## 2019-12-01 MED ORDER — LACTATED RINGERS IV SOLN: INTRAVENOUS | Status: DC

## 2019-12-01 MED ORDER — OXYTOCIN-SODIUM CHLORIDE 30-0.9 UT/500ML-% IV SOLN: 2.5000 [IU]/h | INTRAVENOUS | Status: DC

## 2019-12-01 MED ORDER — SODIUM CHLORIDE 0.9 % IV SOLN
INTRAVENOUS | Status: AC
  Filled 2019-12-01: qty 500

## 2019-12-01 MED ORDER — SODIUM CHLORIDE (PF) 0.9 % IJ SOLN
INTRAMUSCULAR | Status: DC | PRN
  Administered 2019-12-01: 12 mL/h via EPIDURAL

## 2019-12-01 MED ORDER — ONDANSETRON HCL 4 MG/2ML IJ SOLN: 4.0000 mg | Freq: Four times a day (QID) | INTRAMUSCULAR | Status: DC | PRN

## 2019-12-01 MED ORDER — ACETAMINOPHEN 325 MG PO TABS: 650.0000 mg | ORAL_TABLET | ORAL | Status: DC | PRN

## 2019-12-01 MED ORDER — SODIUM CHLORIDE 0.9 % IV SOLN: INTRAVENOUS | Status: DC | PRN

## 2019-12-01 MED ORDER — SOD CITRATE-CITRIC ACID 500-334 MG/5ML PO SOLN
30.0000 mL | ORAL | Status: AC
  Administered 2019-12-01: 30 mL via ORAL

## 2019-12-01 MED ORDER — SIMETHICONE 80 MG PO CHEW
80.0000 mg | CHEWABLE_TABLET | ORAL | Status: DC
  Administered 2019-12-01 – 2019-12-02 (×2): 80 mg via ORAL
  Filled 2019-12-01 (×2): qty 1

## 2019-12-01 MED ORDER — SCOPOLAMINE 1 MG/3DAYS TD PT72: 1.0000 | MEDICATED_PATCH | Freq: Once | TRANSDERMAL | Status: DC

## 2019-12-01 MED ORDER — LIDOCAINE HCL (PF) 1 % IJ SOLN: 30.0000 mL | INTRAMUSCULAR | Status: DC | PRN

## 2019-12-01 MED ORDER — KETOROLAC TROMETHAMINE 30 MG/ML IJ SOLN: 30.0000 mg | Freq: Four times a day (QID) | INTRAMUSCULAR | Status: AC | PRN

## 2019-12-01 MED ORDER — NALBUPHINE HCL 10 MG/ML IJ SOLN
5.0000 mg | INTRAMUSCULAR | Status: DC | PRN
  Filled 2019-12-01: qty 1

## 2019-12-01 MED ORDER — ACETAMINOPHEN 500 MG PO TABS
1000.0000 mg | ORAL_TABLET | Freq: Four times a day (QID) | ORAL | Status: AC
  Administered 2019-12-01 (×3): 1000 mg via ORAL
  Filled 2019-12-01 (×4): qty 2

## 2019-12-01 MED ORDER — OXYTOCIN BOLUS FROM INFUSION: 500.0000 mL | Freq: Once | INTRAVENOUS | Status: DC

## 2019-12-01 MED ORDER — ENOXAPARIN SODIUM 40 MG/0.4ML ~~LOC~~ SOLN
40.0000 mg | SUBCUTANEOUS | Status: DC
  Administered 2019-12-02 – 2019-12-03 (×2): 40 mg via SUBCUTANEOUS
  Filled 2019-12-01 (×2): qty 0.4

## 2019-12-01 MED ORDER — SIMETHICONE 80 MG PO CHEW
80.0000 mg | CHEWABLE_TABLET | Freq: Three times a day (TID) | ORAL | Status: DC
  Administered 2019-12-01 – 2019-12-03 (×7): 80 mg via ORAL
  Filled 2019-12-01 (×7): qty 1

## 2019-12-01 MED ORDER — MORPHINE SULFATE (PF) 0.5 MG/ML IJ SOLN
INTRAMUSCULAR | Status: DC | PRN
  Administered 2019-12-01: 3 mg via EPIDURAL

## 2019-12-01 MED ORDER — FENTANYL CITRATE (PF) 100 MCG/2ML IJ SOLN
INTRAMUSCULAR | Status: AC
  Filled 2019-12-01: qty 2

## 2019-12-01 MED ORDER — PRENATAL MULTIVITAMIN CH
1.0000 | ORAL_TABLET | Freq: Every day | ORAL | Status: DC
  Administered 2019-12-01 – 2019-12-03 (×3): 1 via ORAL
  Filled 2019-12-01 (×3): qty 1

## 2019-12-01 MED ORDER — NALBUPHINE HCL 10 MG/ML IJ SOLN: 5.0000 mg | INTRAMUSCULAR | Status: DC | PRN

## 2019-12-01 MED ORDER — STERILE WATER FOR IRRIGATION IR SOLN
Status: DC | PRN
  Administered 2019-12-01: 1000 mL

## 2019-12-01 MED ORDER — LIDOCAINE-EPINEPHRINE (PF) 2 %-1:200000 IJ SOLN
INTRAMUSCULAR | Status: DC | PRN
  Administered 2019-12-01: 2 mL via EPIDURAL
  Administered 2019-12-01: 5 mL via EPIDURAL
  Administered 2019-12-01: 10 mL via EPIDURAL
  Administered 2019-12-01: 2 mL via EPIDURAL
  Administered 2019-12-01 (×2): 5 mL via EPIDURAL

## 2019-12-01 MED ORDER — EPHEDRINE 5 MG/ML INJ: 10.0000 mg | INTRAVENOUS | Status: DC | PRN

## 2019-12-01 MED ORDER — PHENYLEPHRINE 40 MCG/ML (10ML) SYRINGE FOR IV PUSH (FOR BLOOD PRESSURE SUPPORT): 80.0000 ug | PREFILLED_SYRINGE | INTRAVENOUS | Status: DC | PRN

## 2019-12-01 MED ORDER — NALOXONE HCL 0.4 MG/ML IJ SOLN: 0.4000 mg | INTRAMUSCULAR | Status: DC | PRN

## 2019-12-01 MED ORDER — KETOROLAC TROMETHAMINE 30 MG/ML IJ SOLN
30.0000 mg | Freq: Once | INTRAMUSCULAR | Status: AC | PRN
  Administered 2019-12-01: 30 mg via INTRAVENOUS

## 2019-12-01 MED ORDER — MORPHINE SULFATE (PF) 0.5 MG/ML IJ SOLN
INTRAMUSCULAR | Status: AC
  Filled 2019-12-01: qty 10

## 2019-12-01 MED ORDER — SENNOSIDES-DOCUSATE SODIUM 8.6-50 MG PO TABS
2.0000 | ORAL_TABLET | ORAL | Status: DC
  Administered 2019-12-01: 2 via ORAL
  Filled 2019-12-01 (×2): qty 2

## 2019-12-01 MED ORDER — OXYTOCIN 10 UNIT/ML IJ SOLN
INTRAMUSCULAR | Status: DC | PRN
  Administered 2019-12-01: 30 [IU]

## 2019-12-01 MED ORDER — FLEET ENEMA 7-19 GM/118ML RE ENEM: 1.0000 | ENEMA | RECTAL | Status: DC | PRN

## 2019-12-01 MED ORDER — KETOROLAC TROMETHAMINE 30 MG/ML IJ SOLN
30.0000 mg | Freq: Four times a day (QID) | INTRAMUSCULAR | Status: AC
  Administered 2019-12-01 – 2019-12-02 (×4): 30 mg via INTRAVENOUS
  Filled 2019-12-01 (×4): qty 1

## 2019-12-01 MED ORDER — LACTATED RINGERS IV SOLN: INTRAVENOUS | Status: DC | PRN

## 2019-12-01 MED ORDER — NALOXONE HCL 4 MG/10ML IJ SOLN
1.0000 ug/kg/h | INTRAVENOUS | Status: DC | PRN
  Filled 2019-12-01: qty 5

## 2019-12-01 MED ORDER — FENTANYL CITRATE (PF) 100 MCG/2ML IJ SOLN: 50.0000 ug | INTRAMUSCULAR | Status: DC | PRN

## 2019-12-01 MED ORDER — LACTATED RINGERS IV SOLN
500.0000 mL | INTRAVENOUS | Status: DC | PRN
  Administered 2019-12-01: 500 mL via INTRAVENOUS

## 2019-12-01 MED ORDER — WITCH HAZEL-GLYCERIN EX PADS: 1.0000 "application " | MEDICATED_PAD | CUTANEOUS | Status: DC | PRN

## 2019-12-01 MED ORDER — LIDOCAINE-EPINEPHRINE (PF) 2 %-1:200000 IJ SOLN
INTRAMUSCULAR | Status: AC
  Filled 2019-12-01: qty 10

## 2019-12-01 MED ORDER — OXYCODONE-ACETAMINOPHEN 5-325 MG PO TABS: 2.0000 | ORAL_TABLET | ORAL | Status: DC | PRN

## 2019-12-01 MED ORDER — PHENYLEPHRINE 40 MCG/ML (10ML) SYRINGE FOR IV PUSH (FOR BLOOD PRESSURE SUPPORT)
80.0000 ug | PREFILLED_SYRINGE | INTRAVENOUS | Status: DC | PRN
  Filled 2019-12-01: qty 10

## 2019-12-01 MED ORDER — DIPHENHYDRAMINE HCL 25 MG PO CAPS: 25.0000 mg | ORAL_CAPSULE | Freq: Four times a day (QID) | ORAL | Status: DC | PRN

## 2019-12-01 MED ORDER — PNEUMOCOCCAL VAC POLYVALENT 25 MCG/0.5ML IJ INJ
0.5000 mL | INJECTION | INTRAMUSCULAR | Status: AC
  Administered 2019-12-02: 0.5 mL via INTRAMUSCULAR
  Filled 2019-12-01: qty 0.5

## 2019-12-01 MED ORDER — KETOROLAC TROMETHAMINE 30 MG/ML IJ SOLN
INTRAMUSCULAR | Status: AC
  Filled 2019-12-01: qty 1

## 2019-12-01 MED ORDER — NALBUPHINE HCL 10 MG/ML IJ SOLN: 5.0000 mg | Freq: Once | INTRAMUSCULAR | Status: DC | PRN

## 2019-12-01 MED ORDER — MENTHOL 3 MG MT LOZG: 1.0000 | LOZENGE | OROMUCOSAL | Status: DC | PRN

## 2019-12-01 MED ORDER — FENTANYL CITRATE (PF) 100 MCG/2ML IJ SOLN
INTRAMUSCULAR | Status: DC | PRN
  Administered 2019-12-01: 100 ug via EPIDURAL

## 2019-12-01 MED ORDER — DIPHENHYDRAMINE HCL 50 MG/ML IJ SOLN: 12.5000 mg | INTRAMUSCULAR | Status: DC | PRN

## 2019-12-01 MED ORDER — CEFAZOLIN SODIUM-DEXTROSE 2-3 GM-%(50ML) IV SOLR
INTRAVENOUS | Status: DC | PRN
Start: 2019-12-01 — End: 2019-12-01
  Administered 2019-12-01: 2 g via INTRAVENOUS

## 2019-12-01 MED ORDER — SIMETHICONE 80 MG PO CHEW: 80.0000 mg | CHEWABLE_TABLET | ORAL | Status: DC | PRN

## 2019-12-01 MED ORDER — FENTANYL-BUPIVACAINE-NACL 0.5-0.125-0.9 MG/250ML-% EP SOLN
12.0000 mL/h | EPIDURAL | Status: DC | PRN
  Filled 2019-12-01: qty 250

## 2019-12-01 MED ORDER — COCONUT OIL OIL: 1.0000 "application " | TOPICAL_OIL | Status: DC | PRN

## 2019-12-01 MED ORDER — SODIUM CHLORIDE 0.9 % IV SOLN: 500.0000 mg | INTRAVENOUS | Status: DC

## 2019-12-01 MED ORDER — IBUPROFEN 800 MG PO TABS
800.0000 mg | ORAL_TABLET | Freq: Three times a day (TID) | ORAL | Status: DC
  Administered 2019-12-02 – 2019-12-03 (×4): 800 mg via ORAL
  Filled 2019-12-01 (×4): qty 1

## 2019-12-01 MED ORDER — CEFAZOLIN SODIUM-DEXTROSE 2-4 GM/100ML-% IV SOLN: 2.0000 g | INTRAVENOUS | Status: DC

## 2019-12-01 MED ORDER — HYDROCODONE-ACETAMINOPHEN 5-325 MG PO TABS
1.0000 | ORAL_TABLET | ORAL | Status: DC | PRN
  Administered 2019-12-02 – 2019-12-03 (×3): 1 via ORAL
  Filled 2019-12-01 (×3): qty 1

## 2019-12-01 MED ORDER — SODIUM BICARBONATE 8.4 % IV SOLN
INTRAVENOUS | Status: AC
  Filled 2019-12-01: qty 50

## 2019-12-01 MED ORDER — ONDANSETRON HCL 4 MG/2ML IJ SOLN
INTRAMUSCULAR | Status: AC
  Filled 2019-12-01: qty 2

## 2019-12-01 MED ORDER — ONDANSETRON HCL 4 MG/2ML IJ SOLN: 4.0000 mg | Freq: Three times a day (TID) | INTRAMUSCULAR | Status: DC | PRN

## 2019-12-01 MED ORDER — HYDROXYZINE HCL 50 MG PO TABS: 50.0000 mg | ORAL_TABLET | Freq: Four times a day (QID) | ORAL | Status: DC | PRN

## 2019-12-01 MED ORDER — SODIUM CHLORIDE 0.9% FLUSH: 3.0000 mL | INTRAVENOUS | Status: DC | PRN

## 2019-12-01 MED ORDER — SODIUM CHLORIDE 0.9 % IR SOLN
Status: DC | PRN
  Administered 2019-12-01: 1

## 2019-12-01 MED ORDER — DIPHENHYDRAMINE HCL 25 MG PO CAPS: 25.0000 mg | ORAL_CAPSULE | ORAL | Status: DC | PRN

## 2019-12-01 MED ORDER — SODIUM CHLORIDE 0.9 % IV SOLN
INTRAVENOUS | Status: DC | PRN
  Administered 2019-12-01: 500 mg via INTRAVENOUS

## 2019-12-01 MED ORDER — FENTANYL CITRATE (PF) 100 MCG/2ML IJ SOLN: 25.0000 ug | INTRAMUSCULAR | Status: DC | PRN

## 2019-12-01 MED ORDER — ACETAMINOPHEN 10 MG/ML IV SOLN: 1000.0000 mg | Freq: Once | INTRAVENOUS | Status: DC | PRN

## 2019-12-01 MED ORDER — OXYCODONE-ACETAMINOPHEN 5-325 MG PO TABS: 1.0000 | ORAL_TABLET | ORAL | Status: DC | PRN

## 2019-12-01 MED ORDER — TERBUTALINE SULFATE 1 MG/ML IJ SOLN
INTRAMUSCULAR | Status: AC
  Filled 2019-12-01: qty 1

## 2019-12-01 MED ORDER — LACTATED RINGERS IV SOLN: 500.0000 mL | Freq: Once | INTRAVENOUS | Status: DC

## 2019-12-01 MED ORDER — ONDANSETRON HCL 4 MG/2ML IJ SOLN
INTRAMUSCULAR | Status: DC | PRN
  Administered 2019-12-01: 4 mg via INTRAVENOUS

## 2019-12-01 MED ORDER — ONDANSETRON HCL 4 MG/2ML IJ SOLN: INTRAMUSCULAR | Status: DC | PRN

## 2019-12-01 MED ORDER — OXYTOCIN-SODIUM CHLORIDE 30-0.9 UT/500ML-% IV SOLN: 2.5000 [IU]/h | INTRAVENOUS | Status: AC

## 2019-12-01 MED ORDER — SOD CITRATE-CITRIC ACID 500-334 MG/5ML PO SOLN
30.0000 mL | ORAL | Status: DC | PRN
  Filled 2019-12-01: qty 30

## 2019-12-01 SURGICAL SUPPLY — 37 items
APL PRP STRL LF DISP 70% ISPRP (MISCELLANEOUS) ×1
APL SKNCLS STERI-STRIP NONHPOA (GAUZE/BANDAGES/DRESSINGS)
BENZOIN TINCTURE PRP APPL 2/3 (GAUZE/BANDAGES/DRESSINGS) IMPLANT
CHLORAPREP W/TINT 26 (MISCELLANEOUS) ×2 IMPLANT
CLAMP CORD UMBIL (MISCELLANEOUS) ×2 IMPLANT
CLOTH BEACON ORANGE TIMEOUT ST (SAFETY) ×2 IMPLANT
DRSG OPSITE POSTOP 4X10 (GAUZE/BANDAGES/DRESSINGS) ×2 IMPLANT
ELECT REM PT RETURN 9FT ADLT (ELECTROSURGICAL) ×2
ELECTRODE REM PT RTRN 9FT ADLT (ELECTROSURGICAL) ×1 IMPLANT
EXTRACTOR VACUUM KIWI (MISCELLANEOUS) IMPLANT
EXTRACTOR VACUUM M CUP 4 TUBE (SUCTIONS) IMPLANT
GLOVE BIO SURGEON STRL SZ7.5 (GLOVE) ×2 IMPLANT
GLOVE BIOGEL PI IND STRL 7.0 (GLOVE) ×2 IMPLANT
GLOVE BIOGEL PI INDICATOR 7.0 (GLOVE) ×2
GOWN STRL REUS W/TWL 2XL LVL3 (GOWN DISPOSABLE) ×2 IMPLANT
GOWN STRL REUS W/TWL LRG LVL3 (GOWN DISPOSABLE) ×4 IMPLANT
HEMOSTAT ARISTA ABSORB 3G PWDR (HEMOSTASIS) IMPLANT
KIT ABG SYR 3ML LUER SLIP (SYRINGE) IMPLANT
NEEDLE HYPO 25X5/8 SAFETYGLIDE (NEEDLE) IMPLANT
NS IRRIG 1000ML POUR BTL (IV SOLUTION) ×2 IMPLANT
PACK C SECTION WH (CUSTOM PROCEDURE TRAY) ×2 IMPLANT
PAD ABD 7.5X8 STRL (GAUZE/BANDAGES/DRESSINGS) ×2 IMPLANT
PAD OB MATERNITY 4.3X12.25 (PERSONAL CARE ITEMS) ×2 IMPLANT
RTRCTR C-SECT PINK 25CM LRG (MISCELLANEOUS) ×2 IMPLANT
SPONGE GAUZE 4X4 12PLY STER LF (GAUZE/BANDAGES/DRESSINGS) ×4 IMPLANT
STRIP CLOSURE SKIN 1/2X4 (GAUZE/BANDAGES/DRESSINGS) IMPLANT
SUT PDS AB 0 CTX 36 PDP370T (SUTURE) IMPLANT
SUT PLAIN 0 NONE (SUTURE) IMPLANT
SUT PLAIN 2 0 (SUTURE)
SUT PLAIN ABS 2-0 CT1 27XMFL (SUTURE) IMPLANT
SUT VIC AB 0 CT1 36 (SUTURE) ×4 IMPLANT
SUT VIC AB 2-0 CT1 27 (SUTURE) ×4
SUT VIC AB 2-0 CT1 TAPERPNT 27 (SUTURE) ×2 IMPLANT
SUT VIC AB 4-0 KS 27 (SUTURE) ×2 IMPLANT
TOWEL OR 17X24 6PK STRL BLUE (TOWEL DISPOSABLE) ×4 IMPLANT
TRAY FOLEY W/BAG SLVR 14FR LF (SET/KITS/TRAYS/PACK) IMPLANT
WATER STERILE IRR 1000ML POUR (IV SOLUTION) ×2 IMPLANT

## 2019-12-01 NOTE — Progress Notes (Signed)
Epidural site clean and dry. 

## 2019-12-01 NOTE — Progress Notes (Addendum)
Epidural site clean and dry. 

## 2019-12-01 NOTE — Op Note (Signed)
Shari Davidson PROCEDURE DATE: 12/01/2019  PREOPERATIVE DIAGNOSIS: Intrauterine pregnancy at  [redacted]w[redacted]d weeks gestation; non-reassuring fetal status and remote from delivery  POSTOPERATIVE DIAGNOSIS: The same  PROCEDURE: Primary Low Transverse Cesarean Section  SURGEON:  Dr. Juanna Cao  ASSISTANT: Dr. Clayton Lefort  INDICATIONS: Shari Davidson is a 29 y.o. G2P0010 at [redacted]w[redacted]d taken for cesarean section secondary to non-reassuring fetal status remote from delivery.   The patient was admitted in early labor at 4-5 cm. Immediately upon arrival to the floor patient had recurrent deep variable decelerations. AROM was performed with thick meconium, IUPC and FSE were placed, and an amnioinfusion was started. Despite these resuscitative measures patient continued to have repetitive variable and late decelerations. After approximately two hours the patient's cervix had only progressed to 5 cm and she was consented for primary cesarean.   The risks of cesarean section discussed with the patient included but were not limited to: bleeding which may require transfusion or reoperation; infection which may require antibiotics; injury to bowel, bladder, ureters or other surrounding organs; injury to the fetus; need for additional procedures including hysterectomy in the event of a life-threatening hemorrhage; placental abnormalities wth subsequent pregnancies, incisional problems, thromboembolic phenomenon and other postoperative/anesthesia complications. The patient concurred with the proposed plan, giving informed written consent for the procedure.    FINDINGS:  Viable female infant in LOP presentation.  Apgars 8 and 9, weight 3150 grams.  Meconium stained amniotic fluid.  Intact placenta, three vessel cord.  Normal uterus, fallopian tubes and ovaries bilaterally.  ANESTHESIA:  Epidural INTRAVENOUS FLUIDS:1500 ml ESTIMATED BLOOD LOSS: 294 ml URINE OUTPUT:  300 ml SPECIMENS: Placenta  sent to pathology COMPLICATIONS: None immediate  PROCEDURE IN DETAIL:  The patient received intravenous antibiotics and had sequential compression devices applied to her lower extremities while in the preoperative area.  She was then taken to the operating room where epidural anesthesia was dosed up to surgical level and was found to be adequate. She was then placed in a dorsal supine position with a leftward tilt, and prepped and draped in a sterile manner.  A foley catheter was placed into her bladder and attached to constant gravity, which drained clear fluid throughout.  After an adequate timeout was performed, a Pfannenstiel skin incision was made with scalpel and carried through to the underlying layer of fascia. The fascia was incised in the midline and this incision was extended bilaterally bluntly. The rectus muscles were separated in the midline bluntly and the peritoneum was entered bluntly. An Alexis retractor was placed to aid in visualization of the uterus.  Attention was turned to the lower uterine segment where a transverse hysterotomy was made with a scalpel and extended bilaterally bluntly. The infant was successfully delivered, and cord was clamped and cut and infant was handed over to awaiting neonatology team. A cord gas was obtained (pH 7.22, pCO2 49.2, bicarb 19.8). Uterine massage was then administered and the placenta delivered intact with three-vessel cord. The uterus was then cleared of clot and debris.  The hysterotomy was closed with 0 Vicryl in a running locked fashion, and an imbricating layer was also placed with a 0 Vicryl. Overall, excellent hemostasis was noted. The abdomen and the pelvis were cleared of all clot and debris and the Ubaldo Glassing was removed. Hemostasis was confirmed on all surfaces.  The peritoneum was reapproximated using 2-0 vicryl running stitches. The fascia was then closed using 0 Vicryl in a running fashion. The subcutaneous layer was reapproximated with plain  gut and the skin was closed with 4-0 vicryl. The patient tolerated the procedure well. Sponge, lap, instrument and needle counts were correct x 2. She was taken to the recovery room in stable condition.    [name], MD 12/01/2019 6:07 AM

## 2019-12-01 NOTE — Progress Notes (Signed)
Shari Davidson was referred for history of depression/anxiety. * Referral screened out by Clinical Social Worker because none of the following criteria appear to apply: ~ History of anxiety/depression during this pregnancy, or of post-partum depression following prior delivery. ~ Diagnosis of anxiety and/or depression within last 3 years. Per further chart review, it appears that Shari Davidson was diagnosed with depression in or prior to 2017. CSW also observed that it is noted in [facility] records that Shari Davidson denies concerns of depression at this time.  OR * Shari Davidson's symptoms currently being treated with medication and/or therapy.   Please contact the Clinical Social Worker if needs arise, by [facility] request, or if Shari Davidson scores greater than 9/yes to question 10 on Edinburgh Postpartum Depression Screen.     Claude Manges Laina Guerrieri, MSW, LCSW [facility] [place] [phone]

## 2019-12-01 NOTE — Progress Notes (Signed)
Drenda Freeze Cres-Dishmon CNM notified of pt's admission and status. Aware of variables with each ctx and asked to review strip. Aware of sve, hx GDM diet control. Will admit to BS.

## 2019-12-01 NOTE — Consult Note (Signed)
Neonatology Note:   Attendance at C-section:    I was asked by Dr. Mayford Knife to attend this C/S at term for decels and meconium. The mother is a G2P0010, GBS neg with good prenatal care complicated by GDM diet controlled . ROM 2h 48m prior to delivery, fluid clear. Infant vigorous with good spontaneous cry and tone. +60 sec DCC.  Needed minimal bulb suctioning.  Ap 8/9. Lungs clear to ausc in DR. Family updated.  To CN to care of Pediatrician.  Dineen Kid Leary Roca, MD

## 2019-12-01 NOTE — MAU Note (Signed)
Has been to MAU last 3 days with ctxs. Ctxs have been stronger for last couple hours with bloody show. 3cm last sve

## 2019-12-01 NOTE — Anesthesia Procedure Notes (Signed)
Epidural Patient location during procedure: OB Start time: 12/01/2019 4:05 AM End time: 12/01/2019 4:20 AM  Staffing Anesthesiologist: Elmer Picker, MD Performed: anesthesiologist   Preanesthetic Checklist Completed: patient identified, IV checked, risks and benefits discussed, monitors and equipment checked, pre-op evaluation and timeout performed  Epidural Patient position: sitting Prep: DuraPrep and site prepped and draped Patient monitoring: continuous pulse ox, blood pressure, heart rate and cardiac monitor Approach: midline Location: L3-L4 Injection technique: LOR air  Needle:  Needle type: Tuohy  Needle gauge: 17 G Needle length: 9 cm Needle insertion depth: 4 cm Catheter type: closed end flexible Catheter size: 19 Gauge Catheter at skin depth: 10 cm Test dose: negative  Assessment Sensory level: T8 Events: blood not aspirated, injection not painful, no injection resistance, no paresthesia and negative IV test  Additional Notes Patient identified. Risks/Benefits/Options discussed with patient including but not limited to bleeding, infection, nerve damage, paralysis, failed block, incomplete pain control, headache, blood pressure changes, nausea, vomiting, reactions to medication both or allergic, itching and postpartum back pain. Confirmed with bedside nurse the patient's most recent platelet count. Confirmed with patient that they are not currently taking any anticoagulation, have any bleeding history or any family history of bleeding disorders. Patient expressed understanding and wished to proceed. All questions were answered. Sterile technique was used throughout the entire procedure. Please see nursing notes for vital signs. Test dose was given through epidural catheter and negative prior to continuing to dose epidural or start infusion. Warning signs of high block given to the patient including shortness of breath, tingling/numbness in hands, complete motor block, or  any concerning symptoms with instructions to call for help. Patient was given instructions on fall risk and not to get out of bed. All questions and concerns addressed with instructions to call with any issues or inadequate analgesia.  Reason for block:procedure for pain

## 2019-12-01 NOTE — H&P (Signed)
Shari Davidson is a 29 y.o. female G2P0010 with IUP at 39w6dpresenting for contractions. She has been having ctx for 2-3 days,  Several MAU trips.  Ctx now stronger, + bloody show. Cx 3cms yesterday.  Membranes are intact, with active fetal movement.   [facility] Prenatal History/Complications:  AX6PVPlanned waterbirth, but now wants epidural  Past Medical History: Past Medical History:  Diagnosis Date  . Asthma   . Chlamydia 08/06/11   treated at Urgent Care  . Gonorrhea 04/20/12   treated at Urgent Care  . Migraine   . Trichomonas contact, treated     Past Surgical History: Past Surgical History:  Procedure Laterality Date  . NO PAST SURGERIES      Obstetrical History: OB History    Gravida  2   Para  0   Term      Preterm      AB  1   Living  0     SAB  1   TAB      Ectopic      Multiple      Live Births               Social History: Social History   Socioeconomic History  . Marital status: Single    Spouse name: Not on file  . Number of children: Not on file  . Years of education: Not on file  . Highest education level: High school graduate  Occupational History  . Not on file  Tobacco Use  . Smoking status: Former Smoker    Packs/day: 0.30    Types: Cigarettes    Quit date: 08/30/2014    Years since quitting: 5.2  . Smokeless tobacco: Never Used  Substance and Sexual Activity  . Alcohol use: Not Currently    Comment: OCCASIONAL  . Drug use: No  . Sexual activity: Yes    Birth control/protection: None  Other Topics Concern  . Not on file  Social History Narrative    Lives with mother and 2 sisters   Social Determinants of Health   Financial Resource Strain: Low Risk   . Difficulty of Paying Living Expenses: Not hard at all  Food Insecurity: No Food Insecurity  . Worried About RCharity fundraiserin the Last Year: Never true  . Ran Out of Food in the Last Year: Never true  Transportation Needs: No  Transportation Needs  . Lack of Transportation (Medical): No  . Lack of Transportation (Non-Medical): No  Physical Activity: Inactive  . Days of Exercise per Week: 0 days  . Minutes of Exercise per Session: 0 min  Stress: No Stress Concern Present  . Feeling of Stress : Not at all  Social Connections: Somewhat Isolated  . Frequency of Communication with Friends and Family: More than three times a week  . Frequency of Social Gatherings with Friends and Family: Once a week  . Attends Religious Services: 1 to 4 times per year  . Active Member of Clubs or Organizations: No  . Attends CArchivistMeetings: Never  . Marital Status: Never married    Family History: Family History  Problem Relation Age of Onset  . Hypertension Other   . Healthy Mother   . Healthy Father     Allergies: No Known Allergies  Medications Prior to Admission  Medication Sig Dispense Refill Last Dose  . Accu-Chek Softclix Lancets lancets Use as instructed 100 each 12 11/30/2019 at Unknown time  .  Blood Glucose Monitoring Suppl (ACCU-CHEK GUIDE) w/Device KIT 1 Device by Does not apply route daily. Use as directed 1 kit 0 11/30/2019 at Unknown time  . Blood Pressure Monitoring (BLOOD PRESSURE MONITOR AUTOMAT) DEVI 1 Device by Does not apply route daily. Automatic Blood pressure cuff regular or large size. To check blood pressure regularly at home. ICD-10 code: O09.90. 1 each 0 11/30/2019 at Unknown time  . Prenatal Vit-Fe Fumarate-FA (MULTIVITAMIN-PRENATAL) 27-0.8 MG TABS tablet Take 1 tablet by mouth daily at 12 noon.   11/30/2019 at Unknown time  . albuterol (VENTOLIN HFA) 108 (90 Base) MCG/ACT inhaler Inhale 2 puffs into the lungs every 6 (six) hours as needed for wheezing or shortness of breath. (Patient not taking: Reported on 11/28/2019) 18 g 1   . Cyanocobalamin (B-12 PO) Take by mouth.   More than a month at Unknown time  . glucose blood (ACCU-CHEK GUIDE) test strip Use as instructed 100 each 12   .  zolpidem (AMBIEN) 5 MG tablet Take 1 tablet (5 mg total) by mouth at bedtime as needed for up to 3 doses for sleep. 3 tablet 0         Review of Systems   Constitutional: Negative for fever and chills Eyes: Negative for visual disturbances Respiratory: Negative for shortness of breath, dyspnea Cardiovascular: Negative for chest pain or palpitations  Gastrointestinal: Negative for vomiting, diarrhea and constipation.  POSITIVE for abdominal pain (contractions) Genitourinary: Negative for dysuria and urgency Musculoskeletal: Negative for back pain, joint pain, myalgias  Neurological: Negative for dizziness and headaches      Blood pressure (!) 120/58, pulse 87, temperature 98.8 F (37.1 C), temperature source Oral, resp. rate 18, height '5\' 8"'$  (1.727 m), weight 71.2 kg, last menstrual period 03/10/2019. General appearance: alert, cooperative and no distress Lungs: normal respiratory effort Heart: regular rate and rhythm Abdomen: soft, non-tender; bowel sounds normal Extremities: Homans sign is negative, no sign of DVT DTR's 2+ Presentation: cephalic Fetal monitoring  Baseline: 150 bpm, Variability: Fair-good, Accelerations: nonreactive and Decelerations: deep variables and lates Uterine activity  Repetitive deep variables w/intermittent late decels.  Dilation: 5 Effacement (%): 100 Station: Plus 1 Exam by:: Marcene Duos, RN   Prenatal labs: ABO, Rh: --/--/O POS, O POS Performed at [facility], [place] [address] [place], [place] [address]  [phone]) Antibody: NEG (06/04 0248) Rubella: 1.87 (12/16 1214) RPR: Non Reactive (03/17 0856)  HBsAg: Negative (12/16 1214)  HIV: Non Reactive (03/17 0856)  GBS: Negative/-- (05/19 1455)   Nursing Staff Provider  Office Location [place] Dating  9 week [place]  Language  English Anatomy US  WNL   Flu Vaccine  06/14/19 Genetic Screen  NIPS: low risk  AFP: negative   TDaP vaccine   09/13/19 Hgb A1C or  GTT Early 5.4 Third  trimester 83-182-99 GDM  Rhogam  N/A   LAB RESULTS   Feeding Plan Breast Blood Type O/Positive/-- (12/16 1214) O+  Contraception Depo Provera Antibody Negative (12/16 1214) negative  Circumcision N/A Rubella 1.87 (12/16 1214) immune  Pediatrician  Lewis RPR Non Reactive (12/16 1214)   Support Person FOB-Denzel, Mom-Joanne HBsAg Negative (12/16 1214) negative  Prenatal Classes Info given HIV Non Reactive (12/16 1214)  BTL Consent N/A GBS   Negative  VBAC Consent N/A Pap  06/14/19 - normal     Hgb Electro  Sickle cell trait  BP Cuff Has BP cuff CF Neg horizon    SMA Neg horizon    Waterbirth  '[ ]'$   Class '[ ]'$  Consent '[ ]'$  CNM visit    Prenatal Transfer Tool  Maternal Diabetes: Yes:  Diabetes Type:  Diet controlled Genetic Screening: Normal Maternal Ultrasounds/Referrals: Normal Fetal Ultrasounds or other Referrals:  None Maternal Substance Abuse:  No Significant Maternal Medications:  None Significant Maternal Lab Results: Group B Strep negative     Results for orders placed or performed during the hospital encounter of 12/01/19 (from the past 24 hour(s))  SARS Coronavirus 2 by RT PCR (hospital order, performed in [place] hospital lab) Nasopharyngeal Nasopharyngeal Swab   Collection Time: 12/01/19  2:48 AM   Specimen: Nasopharyngeal Swab  Result Value Ref Range   SARS Coronavirus 2 NEGATIVE NEGATIVE  CBC   Collection Time: 12/01/19  2:48 AM  Result Value Ref Range   WBC 10.9 (H) 4.0 - 10.5 K/uL   RBC 3.34 (L) 3.87 - 5.11 MIL/uL   Hemoglobin 10.9 (L) 12.0 - 15.0 g/dL   HCT 32.3 (L) 36.0 - 46.0 %   MCV 96.7 80.0 - 100.0 fL   MCH 32.6 26.0 - 34.0 pg   MCHC 33.7 30.0 - 36.0 g/dL   RDW 11.9 11.5 - 15.5 %   Platelets 236 150 - 400 K/uL   nRBC 0.0 0.0 - 0.2 %  Type and screen [place]   Collection Time: 12/01/19  2:48 AM  Result Value Ref Range   ABO/RH(D) O POS    Antibody Screen NEG    Sample Expiration      12/04/2019,2359 Performed at [facility], [place] [address], [place], [place] [address]   ABO/Rh   Collection Time: 12/01/19  2:48 AM  Result Value Ref Range   ABO/RH(D)      O POS Performed at [facility] [address], [place], [place] [address]   Glucose, capillary   Collection Time: 12/01/19  3:10 AM  Result Value Ref Range   Glucose-Capillary 89 70 - 99 mg/dL    Assessment: Shari Davidson is a 29 y.o. G2P0010 with an IUP at 77w6dpresenting for early labor w/Cat 2 tracing.  Dr. WJimmye Normanand Dr. EDione Plovernotified of EFM after the pt's 2nd decel in MAU.  Both providers reviewing strip. AROM w/thick mec by EMGM [employer]  IUPC/FSE placed and amnioinfusion started.   Plan: #Labor: expectant management; if tracing doesn't improve will proceed to CS #Pain:  Per request #FWB Cat 1  FChristin Fudge6/09/2019, 4:05 AM

## 2019-12-01 NOTE — Progress Notes (Signed)
Cervix rechecked: Dilation: 5 Effacement (%): 100 Cervical Position: Middle Station: Plus 1 Presentation: Vertex Exam by:: Dr. Crissie Reese, MD Unchanged since arrival to the floor almost two hours ago. Strip has continued to demonstrate recurrent deep late decelerations though has maintained moderate variability. Thick meconium at time of AROM after arrival to the floor.  Discussed with patient that given multiple signs of fetal distress and remote from delivery, we would recommend cesarean delivery at this time. Patient in agreement with this plan.  The risks of cesarean section discussed with the patient included but were not limited to: bleeding which may require transfusion or reoperation; infection which may require antibiotics; injury to bowel, bladder, ureters or other surrounding organs; injury to the fetus; need for additional procedures including hysterectomy in the event of a life-threatening hemorrhage; placental abnormalities with subsequent pregnancies, incisional problems, thromboembolic phenomenon and other postoperative/anesthesia complications. The patient concurred with the proposed plan, giving informed written consent for the procedure. Anesthesia and OR aware. Preoperative prophylactic antibiotics and SCDs ordered on call to the OR. To OR when ready.   Dr. Mayford Knife present for this discussion and in agreement with plan.

## 2019-12-01 NOTE — Discharge Summary (Signed)
Postpartum Discharge Summary  Date of Service updated 12/03/19      Patient Name: Shari Davidson DOB: 1991-05-11 MRN: [id number]  Date of admission: 12/01/2019 Delivery date:12/01/2019  Delivering provider: Juanna Cao T  Date of discharge: 12/03/2019  Admitting diagnosis: Indication for care in labor or delivery [O75.9] Intrauterine pregnancy: [redacted]w[redacted]d    Secondary diagnosis:  Active Problems:   Migraine headache   Asthma   Supervision of normal first pregnancy, antepartum   Sickle cell trait (HElizabeth   Gestational diabetes   Cesarean delivery delivered  Additional problems: None    Discharge diagnosis: Term Pregnancy Delivered and GDM A1                                              Post partum procedures:none Augmentation: AROM Complications: None  Hospital course: Onset of Labor With Unplanned C/S   29y.o. yo G2P0010 at 352w6das admitted in Latent Labor on 12/01/2019. Patient had a labor course significant for: The patient was admitted in early labor at 4-5 cm. Immediately upon arrival to the floor patient had recurrent deep variable decelerations. AROM was performed with thick meconium, IUPC and FSE were placed, and an amnioinfusion was started. Despite these resuscitative measures patient continued to have repetitive variable and late decelerations. After approximately two hours the patient's cervix had only progressed to 5 cm and she was consented for primary cesarean. The patient went for cesarean section due to fetal intolerance of labor remote from delivery. Delivery details as follows: Membrane Rupture Time/Date: 3:08 AM ,12/01/2019   Delivery Method:C-Section, Low Transverse  Details of operation can be found in separate operative note.   Patient had an uncomplicated postpartum course. Fasting CBG was 97. She is ambulating,tolerating a regular diet, passing flatus, and urinating well.  Patient is discharged home in stable condition 12/03/19.  Newborn Data: Birth  date:12/01/2019  Birth time:5:22 AM  Gender:Female  Living status:Living  Apgars:8 ,9  Weight:3150 g   Magnesium Sulfate received: No BMZ received: No Rhophylac:N/A MMR:N/A T-DaP:Given prenatally Flu: Yes Transfusion:No  Physical exam  Vitals:   12/02/19 1353 12/02/19 1937 12/02/19 2119 12/03/19 0600  BP: 127/76 124/80 127/80 122/74  Pulse: 71 70 69 69  Resp: '17 16 14 15  '$ Temp: 98.6 F (37 C) 98.5 F (36.9 C) 98.9 F (37.2 C)   TempSrc: Oral Oral Oral Oral  SpO2: 100% 100% 99% 100%  Weight:      Height:       General: alert, cooperative and no distress Lochia: appropriate Uterine Fundus: firm Incision: honeycomb 1/2 saturated with dark red bleeding, removed, incision well approximated without edema, erythema, or exudate.  Honeycomb replaced by RN. DVT Evaluation: No evidence of DVT seen on physical exam. Negative Homan's sign. No cords or calf tenderness. No significant calf/ankle edema. Labs: Lab Results  Component Value Date   WBC 13.4 (H) 12/02/2019   HGB 9.1 (L) 12/02/2019   HCT 27.3 (L) 12/02/2019   MCV 97.2 12/02/2019   PLT 208 12/02/2019   CMP Latest Ref Rng & Units 05/10/2019  Glucose 70 - 99 mg/dL 98  BUN 6 - 20 mg/dL 7  Creatinine 0.44 - 1.00 mg/dL 0.72  Sodium 135 - 145 mmol/L 134(L)  Potassium 3.5 - 5.1 mmol/L 4.1  Chloride 98 - 111 mmol/L 104  CO2 22 - 32 mmol/L 22  Calcium 8.9 -  10.3 mg/dL 9.5  Total Protein 6.5 - 8.1 g/dL 6.8  Total Bilirubin 0.3 - 1.2 mg/dL 0.5  Alkaline Phos 38 - 126 U/L 40  AST 15 - 41 U/L 16  ALT 0 - 44 U/L 18   Edinburgh Score: Edinburgh Postnatal Depression Scale Screening Tool 12/02/2019  I have been able to laugh and see the funny side of things. 0  I have looked forward with enjoyment to things. 0  I have blamed myself unnecessarily when things went wrong. 0  I have been anxious or worried for no good reason. 0  I have felt scared or panicky for no good reason. 0  Things have been getting on top of me. 1  I  have been so unhappy that I have had difficulty sleeping. 0  I have felt sad or miserable. 0  I have been so unhappy that I have been crying. 0  The thought of harming myself has occurred to me. 0  Edinburgh Postnatal Depression Scale Total 1     After visit meds:  Allergies as of 12/03/2019   No Known Allergies     Medication List    STOP taking these medications   Accu-Chek Guide test strip Generic drug: glucose blood   Accu-Chek Guide w/Device Kit   Accu-Chek Softclix Lancets lancets   Blood Pressure Monitor Automat Devi   zolpidem 5 MG tablet Commonly known as: AMBIEN     TAKE these medications   albuterol 108 (90 Base) MCG/ACT inhaler Commonly known as: VENTOLIN HFA Inhale 2 puffs into the lungs every 6 (six) hours as needed for wheezing or shortness of breath.   ibuprofen 600 MG tablet Commonly known as: ADVIL Take 1 tablet (600 mg total) by mouth every 6 (six) hours as needed.   multivitamin-prenatal 27-0.8 MG Tabs tablet Take 1 tablet by mouth daily at 12 noon.   oxyCODONE-acetaminophen 5-325 MG tablet Commonly known as: Percocet Take 1-2 tablets by mouth every 6 (six) hours as needed for severe pain.        Discharge home in stable condition Infant Feeding: Breast Infant Disposition:home with mother Discharge instruction: per After Visit Summary and Postpartum booklet. Activity: Advance as tolerated. Pelvic rest for 6 weeks.  Diet: carb modified diet Future Appointments: Future Appointments  Date Time Provider [place]  12/08/2019  9:00 AM [facility] NURSE [facility]  01/09/2020  8:55 AM Starr Lake, CNM [facility]  01/09/2020  9:30 AM [facility] [place]   Follow up Visit:   Please schedule this patient for a In person postpartum visit in 6 weeks with the following provider: Any provider. Additional Postpartum F/U:2 hour GTT and Incision check 1 week  High risk pregnancy complicated by: GDM Delivery mode:   C-Section, Low Transverse  Anticipated Birth Control:  Depo   12/03/2019 Fatima Blank, CNM

## 2019-12-01 NOTE — Progress Notes (Signed)
Vitals:   12/01/19 0420 12/01/19 0425  BP: 129/67 126/64  Pulse: 79 79  Resp:    Temp:     FHR 150s w/mod variability, several accels w/intermittent late decels.  Amnioinfusion at 150 cc/hr, changing positions. Dr. Mayford Knife still in CS, DR. Crissie Reese watching strip. Cx unchanged.

## 2019-12-01 NOTE — Transfer of Care (Signed)
Immediate Anesthesia Transfer of Care Note  Patient: Shari Davidson  Procedure(s) Performed: CESAREAN SECTION  Patient Location: PACU  Anesthesia Type:Epidural  Level of Consciousness: awake  Airway & Oxygen Therapy: Patient Spontanous Breathing  Post-op Assessment: Report given to RN and Post -op Vital signs reviewed and stable  Post vital signs: Reviewed and stable  Last Vitals:  Vitals Value Taken Time  BP 110/73 12/01/19 0618  Temp 37.3 C 12/01/19 0618  Pulse 120 12/01/19 0620  Resp 21 12/01/19 0620  SpO2 100 % 12/01/19 0620  Vitals shown include unvalidated device data.  Last Pain:  Vitals:   12/01/19 0300  TempSrc: Oral  PainSc:       Patients Stated Pain Goal: 0 (12/01/19 0224)  Complications: No apparent anesthesia complications

## 2019-12-01 NOTE — Anesthesia Postprocedure Evaluation (Signed)
Anesthesia Post Note  Patient: Shari Davidson  Procedure(s) Performed: CESAREAN SECTION     Patient location during evaluation: PACU Anesthesia Type: Epidural Level of consciousness: oriented and awake and alert Pain management: pain level controlled Vital Signs Assessment: post-procedure vital signs reviewed and stable Respiratory status: spontaneous breathing, respiratory function stable and patient connected to nasal cannula oxygen Cardiovascular status: blood pressure returned to baseline and stable Postop Assessment: no headache, no backache, no apparent nausea or vomiting and epidural receding Anesthetic complications: no    Last Vitals:  Vitals:   12/01/19 1249 12/01/19 1428  BP:  130/68  Pulse:  71  Resp: 20 18  Temp:  37.2 C  SpO2: 96% 98%    Last Pain:  Vitals:   12/01/19 1833  TempSrc:   PainSc: 3    Pain Goal: Patients Stated Pain Goal: 0 (12/01/19 0224)                 Jaquana Geiger L Erubiel Manasco

## 2019-12-01 NOTE — Anesthesia Preprocedure Evaluation (Signed)
Anesthesia Evaluation  Patient identified by MRN, date of birth, ID band Patient awake    Reviewed: Allergy & Precautions, NPO status , Patient's Chart, lab work & pertinent test results  Airway Mallampati: II  TM Distance: >3 FB Neck ROM: Full    Dental no notable dental hx.    Pulmonary asthma , former smoker,    Pulmonary exam normal breath sounds clear to auscultation       Cardiovascular negative cardio ROS Normal cardiovascular exam Rhythm:Regular Rate:Normal     Neuro/Psych  Headaches, PSYCHIATRIC DISORDERS Depression    GI/Hepatic negative GI ROS, Neg liver ROS,   Endo/Other  diabetes, Gestational  Renal/GU negative Renal ROS  negative genitourinary   Musculoskeletal negative musculoskeletal ROS (+)   Abdominal   Peds  Hematology  (+) Blood dyscrasia (Hgb 10.9), anemia ,   Anesthesia Other Findings   Reproductive/Obstetrics (+) Pregnancy                            Anesthesia Physical Anesthesia Plan  ASA: III  Anesthesia Plan: Epidural   Post-op Pain Management:    Induction:   PONV Risk Score and Plan: Treatment may vary due to age or medical condition  Airway Management Planned: Natural Airway  Additional Equipment:   Intra-op Plan:   Post-operative Plan:   Informed Consent: I have reviewed the patients History and Physical, chart, labs and discussed the procedure including the risks, benefits and alternatives for the proposed anesthesia with the patient or authorized representative who has indicated his/her understanding and acceptance.       Plan Discussed with: Anesthesiologist  Anesthesia Plan Comments: (Patient identified. Risks, benefits, options discussed with patient including but not limited to bleeding, infection, nerve damage, paralysis, failed block, incomplete pain control, headache, blood pressure changes, nausea, vomiting, reactions to medication,  itching, and post partum back pain. Confirmed with bedside nurse the patient's most recent platelet count. Confirmed with the patient that they are not taking any anticoagulation, have any bleeding history or any family history of bleeding disorders. Patient expressed understanding and wishes to proceed. All questions were answered. )        Anesthesia Quick Evaluation

## 2019-12-02 LAB — CBC
HCT: 27.3 % — ABNORMAL LOW (ref 36.0–46.0)
Hemoglobin: 9.1 g/dL — ABNORMAL LOW (ref 12.0–15.0)
MCH: 32.4 pg (ref 26.0–34.0)
MCHC: 33.3 g/dL (ref 30.0–36.0)
MCV: 97.2 fL (ref 80.0–100.0)
Platelets: 208 10*3/uL (ref 150–400)
RBC: 2.81 MIL/uL — ABNORMAL LOW (ref 3.87–5.11)
RDW: 12.1 % (ref 11.5–15.5)
WBC: 13.4 10*3/uL — ABNORMAL HIGH (ref 4.0–10.5)
nRBC: 0 % (ref 0.0–0.2)

## 2019-12-02 LAB — RPR: RPR Ser Ql: NONREACTIVE

## 2019-12-02 LAB — GLUCOSE, CAPILLARY: Glucose-Capillary: 97 mg/dL (ref 70–99)

## 2019-12-02 MED ORDER — FERROUS SULFATE 325 (65 FE) MG PO TABS
325.0000 mg | ORAL_TABLET | ORAL | Status: DC
  Administered 2019-12-02: 325 mg via ORAL
  Filled 2019-12-02: qty 1

## 2019-12-02 MED ORDER — MEDROXYPROGESTERONE ACETATE 150 MG/ML IM SUSP: 150.0000 mg | Freq: Once | INTRAMUSCULAR | Status: DC

## 2019-12-02 NOTE — Progress Notes (Signed)
POSTPARTUM PROGRESS NOTE  POD #1  Subjective:  Shari Davidson is a 29 y.o. G2P1011 s/p pLTCS at [redacted]w[redacted]d.  She reports she doing well. No acute events overnight. She denies any problems with ambulating, voiding or po intake. Denies nausea or vomiting. She has passed flatus. Pain is well controlled.  Lochia is like a period.  Objective: Blood pressure 124/78, pulse 74, temperature 98.7 F (37.1 C), resp. rate 16, height 5\' 8"  (1.727 m), weight 71.2 kg, last menstrual period 03/10/2019, SpO2 99 %, unknown if currently breastfeeding.  Physical Exam:  General: alert, cooperative and no distress Chest: no respiratory distress Heart:regular rate, distal pulses intact Abdomen: soft, nontender,  Uterine Fundus: firm, appropriately tender DVT Evaluation: No calf swelling or tenderness Extremities: no LE edema Skin: warm, dry; incision w honeycomb w moderate strikethrough present  Recent Labs    12/01/19 0248 12/02/19 0430  HGB 10.9* 9.1*  HCT 32.3* 27.3*    Assessment/Plan: 02/01/20 Shari Davidson is a 29 y.o. G2P1011 s/p pLTCS at [redacted]w[redacted]d for fetal intolerance of labor remote from delivery.  POD#1 - Doing welll; pain well controlled. H/H appropriate  Routine postpartum care  OOB, ambulated  Lovenox for VTE prophylaxis Anemia: asymptomatic hgb 10.9>9.1  Start po ferrous sulfate every other day Contraception: Depo Feeding: breast GDMA1: fasting CBG 97  Dispo: Plan for discharge POD#2-3.   LOS: 1 day   [redacted]w[redacted]d, MD/[facility] OB Fellow  12/02/2019, 7:47 AM

## 2019-12-03 MED ORDER — OXYCODONE-ACETAMINOPHEN 5-325 MG PO TABS: 1.0000 | ORAL_TABLET | Freq: Four times a day (QID) | ORAL | 0 refills | Status: DC | PRN

## 2019-12-03 MED ORDER — IBUPROFEN 600 MG PO TABS: 600.0000 mg | ORAL_TABLET | Freq: Four times a day (QID) | ORAL | 1 refills | Status: DC | PRN

## 2019-12-05 LAB — SURGICAL PATHOLOGY

## 2019-12-06 ENCOUNTER — Telehealth: Payer: Self-pay | Admitting: Certified Nurse Midwife

## 2019-12-06 ENCOUNTER — Telehealth: Payer: Medicaid Other | Admitting: Obstetrics and Gynecology

## 2019-12-08 ENCOUNTER — Ambulatory Visit: Payer: Self-pay

## 2019-12-11 ENCOUNTER — Ambulatory Visit (INDEPENDENT_AMBULATORY_CARE_PROVIDER_SITE_OTHER): Payer: Medicaid Other | Admitting: *Deleted

## 2019-12-11 ENCOUNTER — Other Ambulatory Visit: Payer: Self-pay

## 2019-12-11 ENCOUNTER — Encounter: Payer: Self-pay | Admitting: *Deleted

## 2019-12-11 ENCOUNTER — Encounter: Payer: Medicaid Other | Admitting: Certified Nurse Midwife

## 2019-12-11 VITALS — BP 132/64 | HR 78 | Temp 98.0°F | Ht 68.0 in | Wt 139.4 lb

## 2019-12-11 DIAGNOSIS — Z4889 Encounter for other specified surgical aftercare: Secondary | ICD-10-CM

## 2019-12-11 NOTE — Progress Notes (Signed)
Agree with A & P. 

## 2019-12-11 NOTE — Progress Notes (Signed)
Pt presents for incision check following C/S on 12/01/19. She reports no problems and has no pain. Incision found to be well healed and without bleeding, redness or drainage. Some firm swelling noted above the Lt side of the incision. Pt denied pain or discomfort when the area was touched. Per consult with Dr. Alysia Penna, pt was advised that this swelling is normal and will decrease gradually. Pt was instructed for proper cleaning process of the incision area. PP appt with 2hr GTT scheduled on 01/09/20. She voiced understanding of all information and instructions given.

## 2019-12-12 ENCOUNTER — Encounter: Payer: Medicaid Other | Admitting: Student

## 2020-01-08 ENCOUNTER — Other Ambulatory Visit: Payer: Self-pay | Admitting: General Practice

## 2020-01-09 ENCOUNTER — Encounter: Payer: Self-pay | Admitting: Family Medicine

## 2020-01-09 ENCOUNTER — Ambulatory Visit: Payer: Self-pay | Admitting: Student

## 2020-01-09 ENCOUNTER — Other Ambulatory Visit: Payer: Self-pay

## 2020-01-24 ENCOUNTER — Ambulatory Visit (INDEPENDENT_AMBULATORY_CARE_PROVIDER_SITE_OTHER): Payer: Medicaid Other | Admitting: Obstetrics and Gynecology

## 2020-01-24 ENCOUNTER — Other Ambulatory Visit: Payer: Self-pay

## 2020-01-24 ENCOUNTER — Encounter: Payer: Self-pay | Admitting: Obstetrics and Gynecology

## 2020-01-24 DIAGNOSIS — Z3009 Encounter for other general counseling and advice on contraception: Secondary | ICD-10-CM

## 2020-01-24 DIAGNOSIS — Z98891 History of uterine scar from previous surgery: Secondary | ICD-10-CM

## 2020-01-24 MED ORDER — NORGESTIMATE-ETH ESTRADIOL 0.25-35 MG-MCG PO TABS: 1.0000 | ORAL_TABLET | Freq: Every day | ORAL | 11 refills | Status: DC

## 2020-01-24 NOTE — Progress Notes (Signed)
Subjective:     Shari Davidson is a 29 y.o. female who presents for a postpartum visit. She is 8 weeks postpartum following a low cervical transverse Cesarean section. I have fully reviewed the prenatal and intrapartum course. The delivery was at 38w gestational weeks. Outcome: primary cesarean section, low transverse incision. Anesthesia: epidural. Postpartum course has been uncomplicated. Baby's course has been uncomplicated.  Baby is feeding by bottle - [employer] Good Start. Bleeding no bleeding. Bowel function is normal. Bladder function is normal. Patient is not sexually active. Contraception method is OCP (estrogen/progesterone). Postpartum depression screening: negative.   The following portions of the patient's history were reviewed and updated as appropriate: allergies, current medications, past family history, past medical history, past social history, past surgical history and problem list.  Review of Systems Pertinent items are noted in HPI.   Objective:    BP 127/71    Pulse 80    LMP 03/10/2019 (Exact Date)    Breastfeeding No   General:  alert, cooperative and fatigued  Lungs: clear to auscultation bilaterally  Heart:  regular rate and rhythm, S1, S2 normal, no murmur, click, rub or gallop  Abdomen: soft, non-tender; bowel sounds normal; no masses,  no organomegaly   Assessment:   Normal postpartum exam. Pap smear not done at today's visit.   Plan:   1. Contraception: OCP (estrogen/progesterone) 2. Needs 2 hour GTT, patient not fasting today. 3. Follow up in: 1 weeks or as needed.     Venia Carbon I, NP 01/24/2020 10:40 AM

## 2020-01-29 ENCOUNTER — Other Ambulatory Visit: Payer: Medicaid Other

## 2020-01-29 ENCOUNTER — Other Ambulatory Visit: Payer: Self-pay | Admitting: General Practice

## 2020-03-22 ENCOUNTER — Ambulatory Visit: Payer: Medicaid Other | Admitting: Family Medicine

## 2020-03-29 ENCOUNTER — Ambulatory Visit: Payer: Medicaid Other | Admitting: Family Medicine

## 2020-05-31 ENCOUNTER — Ambulatory Visit: Payer: Medicaid Other | Admitting: Family Medicine

## 2020-09-02 ENCOUNTER — Inpatient Hospital Stay (HOSPITAL_COMMUNITY)
Admission: AD | Admit: 2020-09-02 | Discharge: 2020-09-02 | Disposition: A | Discharge status: Home / Self Care | Payer: Medicaid Other | Attending: Obstetrics & Gynecology | Admitting: Obstetrics & Gynecology

## 2020-09-02 ENCOUNTER — Encounter (HOSPITAL_COMMUNITY): Payer: Self-pay | Admitting: *Deleted

## 2020-09-02 ENCOUNTER — Other Ambulatory Visit: Payer: Self-pay

## 2020-09-02 ENCOUNTER — Inpatient Hospital Stay (HOSPITAL_COMMUNITY): Payer: Medicaid Other

## 2020-09-02 ENCOUNTER — Other Ambulatory Visit: Payer: Self-pay | Admitting: Certified Nurse Midwife

## 2020-09-02 DIAGNOSIS — N760 Acute vaginitis: Secondary | ICD-10-CM

## 2020-09-02 DIAGNOSIS — R1032 Left lower quadrant pain: Secondary | ICD-10-CM | POA: Diagnosis present

## 2020-09-02 DIAGNOSIS — O99891 Other specified diseases and conditions complicating pregnancy: Secondary | ICD-10-CM

## 2020-09-02 DIAGNOSIS — Z3A01 Less than 8 weeks gestation of pregnancy: Secondary | ICD-10-CM | POA: Diagnosis not present

## 2020-09-02 DIAGNOSIS — O23591 Infection of other part of genital tract in pregnancy, first trimester: Secondary | ICD-10-CM | POA: Diagnosis not present

## 2020-09-02 DIAGNOSIS — R109 Unspecified abdominal pain: Secondary | ICD-10-CM

## 2020-09-02 DIAGNOSIS — O26891 Other specified pregnancy related conditions, first trimester: Secondary | ICD-10-CM

## 2020-09-02 DIAGNOSIS — O26899 Other specified pregnancy related conditions, unspecified trimester: Secondary | ICD-10-CM

## 2020-09-02 DIAGNOSIS — Z87891 Personal history of nicotine dependence: Secondary | ICD-10-CM | POA: Diagnosis not present

## 2020-09-02 DIAGNOSIS — B9689 Other specified bacterial agents as the cause of diseases classified elsewhere: Secondary | ICD-10-CM

## 2020-09-02 DIAGNOSIS — O3680X0 Pregnancy with inconclusive fetal viability, not applicable or unspecified: Secondary | ICD-10-CM | POA: Diagnosis not present

## 2020-09-02 LAB — CBC
HCT: 33.6 % — ABNORMAL LOW (ref 36.0–46.0)
Hemoglobin: 11.8 g/dL — ABNORMAL LOW (ref 12.0–15.0)
MCH: 31.8 pg (ref 26.0–34.0)
MCHC: 35.1 g/dL (ref 30.0–36.0)
MCV: 90.6 fL (ref 80.0–100.0)
Platelets: 289 10*3/uL (ref 150–400)
RBC: 3.71 MIL/uL — ABNORMAL LOW (ref 3.87–5.11)
RDW: 12.5 % (ref 11.5–15.5)
WBC: 4.8 10*3/uL (ref 4.0–10.5)
nRBC: 0 % (ref 0.0–0.2)

## 2020-09-02 LAB — URINALYSIS, ROUTINE W REFLEX MICROSCOPIC
Bilirubin Urine: NEGATIVE
Glucose, UA: NEGATIVE mg/dL
Hgb urine dipstick: NEGATIVE
Ketones, ur: NEGATIVE mg/dL
Leukocytes,Ua: NEGATIVE
Nitrite: NEGATIVE
Protein, ur: NEGATIVE mg/dL
Specific Gravity, Urine: 1.014 (ref 1.005–1.030)
pH: 5 (ref 5.0–8.0)

## 2020-09-02 LAB — COMPREHENSIVE METABOLIC PANEL
ALT: 14 U/L (ref 0–44)
AST: 15 U/L (ref 15–41)
Albumin: 4.2 g/dL (ref 3.5–5.0)
Alkaline Phosphatase: 46 U/L (ref 38–126)
Anion gap: 9 (ref 5–15)
BUN: 8 mg/dL (ref 6–20)
CO2: 24 mmol/L (ref 22–32)
Calcium: 9.4 mg/dL (ref 8.9–10.3)
Chloride: 103 mmol/L (ref 98–111)
Creatinine, Ser: 0.85 mg/dL (ref 0.44–1.00)
GFR, Estimated: >60 mL/min (ref >60)
Glucose, Bld: 95 mg/dL (ref 70–99)
Potassium: 3.7 mmol/L (ref 3.5–5.1)
Sodium: 136 mmol/L (ref 135–145)
Total Bilirubin: 0.8 mg/dL (ref 0.3–1.2)
Total Protein: 7 g/dL (ref 6.5–8.1)

## 2020-09-02 LAB — WET PREP, GENITAL
Sperm: NONE SEEN
Trich, Wet Prep: NONE SEEN
Yeast Wet Prep HPF POC: NONE SEEN

## 2020-09-02 LAB — HCG, QUANTITATIVE, PREGNANCY: hCG, Beta Chain, Quant, S: 1655 m[IU]/mL — ABNORMAL HIGH (ref <5)

## 2020-09-02 LAB — POCT PREGNANCY, URINE: Preg Test, Ur: POSITIVE — AB

## 2020-09-02 MED ORDER — METRONIDAZOLE 0.75 % VA GEL: 1.0000 | Freq: Two times a day (BID) | VAGINAL | 0 refills | Status: DC

## 2020-09-02 NOTE — Discharge Instructions (Signed)
Ectopic Pregnancy  An ectopic pregnancy happens when a fertilized egg grows outside of the womb (uterus). Fertilized means that sperm entered the egg. The egg cannot stay alive outside of the womb. What are the causes? The most common cause is damage to a fallopian tube. This damage stops the egg from getting to the womb. Instead, the egg stays in the tube. Sometimes, an ectopic pregnancy happens in other parts of the body. What increases the risk?  Getting treatment before to help you have a baby.  A past pregnancy outside of the womb.  A past surgery to have your tubes tied.  Getting pregnant while using a device in the womb to avoid getting pregnant.  Taking birth control pills before the age of 16.  Smoking or drinking alcohol.  Having a mother who took a medicine called DES many years ago. What are the signs or symptoms? Common symptoms of this condition include:  Missing a menstrual period.  Feeling like you may vomit.  Tiredness.  Breast pain.  Other signs that you are pregnant. Other symptoms may include:  Pain during sex.  Bleeding from the vagina.  Belly pain.  A fast heartbeat, low blood pressure, and sweating.  Pain or extra pressure while pooping (having a bowel movement). If your tube tears or bursts:  You may have sudden and very bad pain in your belly.  You may feel dizzy, weak, or light-headed.  You may faint.  You may have pain in your shoulder or neck. A torn or burst tube is an emergency. It can be life-threatening. How is this treated? This condition may be treated with:  Medicine. This may be given if: ? The pregnancy is found early and you are not bleeding. ? The tube has not torn or burst.  Surgery. This may be done to: ? Take out the pregnancy tissue. ? Stop bleeding. ? Take out part or all of the tube. ? Take out the womb. This is rare.  Blood tests.  Careful watching. Follow these instructions at home: Medicines  Take  over-the-counter and prescription medicines only as told by your doctor.  If told, take steps to prevent problems with pooping (constipation). You may need to: ? Drink enough fluid to keep your pee (urine) pale yellow. ? Take medicines. You will be told what medicines to take. ? Eat foods that are high in fiber. These include beans, whole grains, and fresh fruits and vegetables. ? Limit foods that are high in fat and sugar. These include fried or sweet foods.  Ask your doctor if you should avoid driving or using machines while you are taking your medicine. General instructions  Rest or limit your activities, if told to do this.  Do not have sex for 6 weeks or as told by your doctor.  Do not put tampons, vaginal cleaning wash (douche), or other things in your vagina. Do not use these things for 6 weeks or until your doctor says it is safe to use them.  Do not lift anything that is heavier than 10 lb (4.5 kg), or the limit that you are told.  Return to your normal activities when your doctor says that it is safe.  Keep all follow-up visits. Contact a doctor if:  You have a fever or chills.  You feel like you may vomit and you vomit. Get help right away if:  Your pain gets worse or is not helped by medicine.  You feel dizzy or weak.  You feel   light-headed.  You faint.  You have sudden and very bad pain in your belly.  You have very bad pain in your shoulder or neck. Summary  An ectopic pregnancy happens when a fertilized egg grows outside the womb.  This is an emergency.  The most common cause is damage to one of the fallopian tubes.  This condition may be treated with medicine, surgery, blood tests, or careful watching. This information is not intended to replace advice given to you by your health care provider. Make sure you discuss any questions you have with your health care provider. Document Revised: 10/06/2019 Document Reviewed: 09/26/2019 [employer] Patient  Education  2021 [employer]  

## 2020-09-02 NOTE — MAU Provider Note (Addendum)
History     CSN: [id number]  Arrival date and time: 09/02/20 2021   Event Date/Time   First Provider Initiated Contact with Patient 09/02/20 2110     Chief Complaint  Patient presents with  . Abdominal Pain   HPI Shari Davidson is a 30 y.o. G3P1011 at [redacted]w[redacted]d who presents to MAU with chief complaint of left periumbilical and LLQ pain. These are new problems, onset yesterday 09/01/2020. Patient's pain score is 8/10, pain does not radiate. She has not taken medication or tried other treatments for these complaints. Patient denies abdominal tenderness, vaginal bleeding, dysuria, fever or recent illness.   OB History     Gravida  3   Para  1   Term  1   Preterm      AB  1   Living  1      SAB  1   IAB      Ectopic      Multiple  0   Live Births  1           Past Medical History:  Diagnosis Date  . Asthma   . Chlamydia 08/06/11   treated at Urgent Care  . Gonorrhea 04/20/12   treated at Urgent Care  . Migraine   . Trichomonas contact, treated     Past Surgical History:  Procedure Laterality Date  . CESAREAN SECTION  12/01/2019   Procedure: CESAREAN SECTION;  Surgeon: Malachy Chamber, MD;  [facility]: [facility];  Service: Obstetrics;;  . NO PAST SURGERIES      Family History  Problem Relation Age of Onset  . Hypertension Other   . Healthy Mother   . Healthy Father     Social History   Tobacco Use  . Smoking status: Former Smoker    Packs/day: 0.30    Types: Cigarettes    Quit date: 08/30/2014    Years since quitting: 6.0  . Smokeless tobacco: Never Used  Vaping Use  . Vaping Use: Never used  Substance Use Topics  . Alcohol use: Not Currently    Comment: OCCASIONAL  . Drug use: No    Allergies: No Known Allergies  Medications Prior to Admission  Medication Sig Dispense Refill Last Dose  . albuterol (VENTOLIN HFA) 108 (90 Base) MCG/ACT inhaler Inhale 2 puffs into the lungs every 6 (six) hours as needed for wheezing or shortness  of breath. (Patient not taking: No sig reported) 18 g 1 More than a month at Unknown time  . ibuprofen (ADVIL) 600 MG tablet Take 1 tablet (600 mg total) by mouth every 6 (six) hours as needed. (Patient not taking: Reported on 01/24/2020) 30 tablet 1   . norgestimate-ethinyl estradiol (ORTHO-CYCLEN) 0.25-35 MG-MCG tablet Take 1 tablet by mouth daily. 30 tablet 11   . oxyCODONE-acetaminophen (PERCOCET) 5-325 MG tablet Take 1-2 tablets by mouth every 6 (six) hours as needed for severe pain. (Patient not taking: Reported on 01/24/2020) 30 tablet 0   . Prenatal Vit-Fe Fumarate-FA (MULTIVITAMIN-PRENATAL) 27-0.8 MG TABS tablet Take 1 tablet by mouth daily at 12 noon.       Review of Systems  Constitutional: Negative for fever.  Gastrointestinal: Positive for abdominal pain.  Genitourinary: Negative for vaginal bleeding.  Musculoskeletal: Negative for back pain.  All other systems reviewed and are negative.  Physical Exam   Blood pressure (!) 112/47, pulse 84, temperature 98.9 F (37.2 C), temperature source Oral, resp. rate 20, height 5\' 4"  (1.626 m), weight 63.4 kg, last  menstrual period 07/22/2020, not currently breastfeeding.  Physical Exam Vitals and nursing note reviewed. Exam conducted with a chaperone present.  Constitutional:      Appearance: She is well-developed.  Cardiovascular:     Rate and Rhythm: Normal rate.     Heart sounds: Normal heart sounds.  Pulmonary:     Effort: Pulmonary effort is normal.     Breath sounds: Normal breath sounds.  Abdominal:     General: Abdomen is flat.     Palpations: Abdomen is soft.     Tenderness: There is no abdominal tenderness. There is no right CVA tenderness or left CVA tenderness.    Skin:    General: Skin is warm and dry.     Capillary Refill: Capillary refill takes less than 2 seconds.  Neurological:     Mental Status: She is alert and oriented to person, place, and time.  Psychiatric:        Mood and Affect: Mood normal.         Behavior: Behavior normal.     MAU Course  Procedures  Orders Placed This Encounter  Procedures  . Wet prep, genital  . US OB LESS THAN 14 WEEKS WITH OB TRANSVAGINAL  . Urinalysis, Routine w reflex microscopic Urine, Clean Catch  . CBC  . Comprehensive metabolic panel  . hCG, quantitative, pregnancy  . Nursing communication  . Pregnancy, urine POC   Patient Vitals for the past 24 hrs:  BP Temp Temp src Pulse Resp Height Weight  09/02/20 2042 (!) 112/47 98.9 F (37.2 C) Oral 84 20 5\' 4"  (1.626 m) 63.4 kg   Report given to , CNM who assumes care of patient at this time.  Tyler Aas, MSN, CNM Certified Nurse Midwife, [facility] for [facility] 09/02/20 9:57 PM   Assumed care at 2200. Results as follows: Results for orders placed or performed during the hospital encounter of 09/02/20 (from the past 24 hour(s))  Pregnancy, urine POC     Status: Abnormal   Collection Time: 09/02/20  8:52 PM  Result Value Ref Range   Preg Test, Ur POSITIVE (A) NEGATIVE  Urinalysis, Routine w reflex microscopic     Status: Abnormal   Collection Time: 09/02/20  9:28 PM  Result Value Ref Range   Color, Urine YELLOW YELLOW   APPearance HAZY (A) CLEAR   Specific Gravity, Urine 1.014 1.005 - 1.030   pH 5.0 5.0 - 8.0   Glucose, UA NEGATIVE NEGATIVE mg/dL   Hgb urine dipstick NEGATIVE NEGATIVE   Bilirubin Urine NEGATIVE NEGATIVE   Ketones, ur NEGATIVE NEGATIVE mg/dL   Protein, ur NEGATIVE NEGATIVE mg/dL   Nitrite NEGATIVE NEGATIVE   Leukocytes,Ua NEGATIVE NEGATIVE  Wet prep, genital     Status: Abnormal   Collection Time: 09/02/20  9:28 PM   Specimen: Vaginal  Result Value Ref Range   Yeast Wet Prep HPF POC NONE SEEN NONE SEEN   Trich, Wet Prep NONE SEEN NONE SEEN   Clue Cells Wet Prep HPF POC PRESENT (A) NONE SEEN   WBC, Wet Prep HPF POC MANY (A) NONE SEEN   Sperm NONE SEEN   CBC     Status: Abnormal   Collection Time:  09/02/20  9:34 PM  Result Value Ref Range   WBC 4.8 4.0 - 10.5 K/uL   RBC 3.71 (L) 3.87 - 5.11 MIL/uL   Hemoglobin 11.8 (L) 12.0 - 15.0 g/dL   HCT 11/02/20 (L) 72.5 - 36.6 %  MCV 90.6 80.0 - 100.0 fL   MCH 31.8 26.0 - 34.0 pg   MCHC 35.1 30.0 - 36.0 g/dL   RDW 96.0 45.4 - 09.8 %   Platelets 289 150 - 400 K/uL   nRBC 0.0 0.0 - 0.2 %  Comprehensive metabolic panel     Status: None   Collection Time: 09/02/20  9:34 PM  Result Value Ref Range   Sodium 136 135 - 145 mmol/L   Potassium 3.7 3.5 - 5.1 mmol/L   Chloride 103 98 - 111 mmol/L   CO2 24 22 - 32 mmol/L   Glucose, Bld 95 70 - 99 mg/dL   BUN 8 6 - 20 mg/dL   Creatinine, Ser 1.19 0.44 - 1.00 mg/dL   Calcium 9.4 8.9 - 14.7 mg/dL   Total Protein 7.0 6.5 - 8.1 g/dL   Albumin 4.2 3.5 - 5.0 g/dL   AST 15 15 - 41 U/L   ALT 14 0 - 44 U/L   Alkaline Phosphatase 46 38 - 126 U/L   Total Bilirubin 0.8 0.3 - 1.2 mg/dL   GFR, Estimated >82 >95 mL/min   Anion gap 9 5 - 15  hCG, quantitative, pregnancy     Status: Abnormal   Collection Time: 09/02/20  9:34 PM  Result Value Ref Range   hCG, Beta Chain, Quant, S 1,655 (H) <5 mIU/mL   US OB LESS THAN 14 WEEKS WITH OB TRANSVAGINAL  Result Date: 09/02/2020 CLINICAL DATA:  Pregnant patient in first-trimester pregnancy with left lower quadrant pain. EXAM: OBSTETRIC <14 WK [place] AND TRANSVAGINAL OB US TECHNIQUE: Both transabdominal and transvaginal ultrasound examinations were performed for complete evaluation of the gestation as well as the maternal uterus, adnexal regions, and pelvic cul-de-sac. Transvaginal technique was performed to assess early pregnancy. COMPARISON:  None this pregnancy FINDINGS: Intrauterine gestational sac: Single Yolk sac:  Not Visualized. Embryo:  Not Visualized. Cardiac Activity: Not Visualized. MSD: 3 mm   5 w   0 d Subchorionic hemorrhage:  None visualized. Maternal uterus/adnexae: Probable early intrauterine gestational sac. There is trace free fluid in the endometrial canal but  does not appear to be subchorionic hemorrhage. There is a corpus luteal cyst in the right ovary. The left ovary is normal. No pelvic free fluid or adnexal mass. IMPRESSION: Probable early intrauterine gestational sac, but no yolk sac, fetal pole, or cardiac activity yet visualized. Recommend follow-up quantitative B-HCG levels and follow-up US in 14 days to assess viability. This recommendation follows SRU consensus guidelines: Diagnostic Criteria for Nonviable Pregnancy Early in the First Trimester. Malva Limes Med 2013; 621:3086-57. Electronically Signed   By: Narda Rutherford M.D.   On: 09/02/2020 22:30   Informed pt of results and advised to go to [facility] on Wednesday for repeat quant, repeat U/S ordered and message sent to office to have it scheduled for 09/16/20. Strong ectopic precautions given.  Pt requested metrogel for BV, sent to pharmacy.  Assessment and Plan  Pregnancy of unknown location Bacterial vaginosis   Discharge home in stable condition with ectopic/first trimester precautions  [facility], then f/u ultrasound in two weeks.  Allergies as of 09/02/2020   No Known Allergies     Medication List    STOP taking these medications   albuterol 108 (90 Base) MCG/ACT inhaler Commonly known as: VENTOLIN HFA   ibuprofen 600 MG tablet Commonly known as: ADVIL   norgestimate-ethinyl estradiol 0.25-35 MG-MCG tablet Commonly known as: ORTHO-CYCLEN   oxyCODONE-acetaminophen 5-325 MG  tablet Commonly known as: Percocet     TAKE these medications   metroNIDAZOLE 0.75 % vaginal gel Commonly known as: METROGEL VAGINAL Place 1 Applicatorful vaginally 2 (two) times daily.   multivitamin-prenatal 27-0.8 MG Tabs tablet Take 1 tablet by mouth daily at 12 noon.      Edd ArbourJamilla Gurjot Brisco, CNM, MSN, IBCLC Certified Nurse Midwife, [facility]

## 2020-09-02 NOTE — MAU Note (Signed)
PT SAYS 2 HPT POSITIVE ON 3-5.  FEELS CRAMPING - LAST NIGHT .

## 2020-09-03 LAB — GC/CHLAMYDIA PROBE AMP (~~LOC~~) NOT AT ARMC
Chlamydia: NEGATIVE
Comment: NEGATIVE
Comment: NORMAL
Neisseria Gonorrhea: NEGATIVE

## 2020-09-04 ENCOUNTER — Ambulatory Visit (INDEPENDENT_AMBULATORY_CARE_PROVIDER_SITE_OTHER): Payer: Medicaid Other | Admitting: *Deleted

## 2020-09-04 ENCOUNTER — Other Ambulatory Visit: Payer: Self-pay

## 2020-09-04 ENCOUNTER — Encounter: Payer: Self-pay | Admitting: *Deleted

## 2020-09-04 VITALS — BP 111/54 | HR 78 | Ht 64.0 in | Wt 139.2 lb

## 2020-09-04 DIAGNOSIS — O3680X0 Pregnancy with inconclusive fetal viability, not applicable or unspecified: Secondary | ICD-10-CM

## 2020-09-04 LAB — BETA HCG QUANT (REF LAB): hCG Quant: 1235 m[IU]/mL

## 2020-09-04 NOTE — Progress Notes (Signed)
Pt presents for stat BHCG following MAU visit on 3/7. She reports having no pain or vaginal bleeding. She stated that she had one brief episode of pressure on Lt side of her back yesterday. It lasted 5-10 minutes and was not painful. Pt was advised that she will be called with results later today and next steps in plan of care. She voiced understanding and stated that a detailed voicemail message can be left if she does not answer.   1650  Discussed today's BHCG with Edd Arbour who finds results are suspicious for miscarriage but not yet conclusive. She advised that pt needs repeat stat BHCG on 3/11. I called pt and informed her of results and recommended plan of care. Pt should return to MAU if she develops heavy vaginal bleeding or abdominal pain. She voiced understanding and agreed to appt on 3/11 @ 0900.

## 2020-09-05 NOTE — Progress Notes (Signed)
Patient was assessed and managed by nursing staff during this encounter. I have reviewed the chart and agree with the documentation and plan.   Bernerd Limbo, CNM 09/05/2020 8:40 AM

## 2020-09-06 ENCOUNTER — Other Ambulatory Visit: Payer: Self-pay

## 2020-09-06 ENCOUNTER — Ambulatory Visit (INDEPENDENT_AMBULATORY_CARE_PROVIDER_SITE_OTHER): Payer: Medicaid Other | Admitting: *Deleted

## 2020-09-06 ENCOUNTER — Encounter: Payer: Self-pay | Admitting: *Deleted

## 2020-09-06 VITALS — BP 118/66 | HR 74 | Ht 64.0 in | Wt 139.0 lb

## 2020-09-06 DIAGNOSIS — O3680X0 Pregnancy with inconclusive fetal viability, not applicable or unspecified: Secondary | ICD-10-CM

## 2020-09-06 LAB — BETA HCG QUANT (REF LAB): hCG Quant: 1244 m[IU]/mL

## 2020-09-06 NOTE — Progress Notes (Signed)
Pt reports intermittent cramping which is the same as when seen on 3/9. She denies vaginal bleeding. BHCG was drawn and pt will be called with results and plan of care later today. She voiced understanding.   1130  Results reviewed by Dr. Crissie Reese who finds hormone level consistent with failed pregnancy. Recommended plan of care is for pt to go to MAU for evaluation and MTX. I called pt and informed her of test results and plan of care. She voiced understanding of results and stated that she will try to go to the hospital today. If she is not able to go today, she will go first thing tomorrow morning. Pt was reminded that if she develops heavy vaginal bleeding or increased abdominal pain, she needs [place] to MAU immediately. Pt voiced understanding of all information and instructions given.

## 2020-09-07 NOTE — Progress Notes (Signed)
Chart reviewed for nurse visit. Agree with plan of care.   [name], MD 09/07/20 11:00 AM

## 2021-01-09 ENCOUNTER — Ambulatory Visit: Payer: Medicaid Other | Admitting: Family Medicine

## 2021-01-19 ENCOUNTER — Inpatient Hospital Stay (HOSPITAL_COMMUNITY)
Admission: AD | Admit: 2021-01-19 | Discharge: 2021-01-19 | Disposition: A | Discharge status: Home / Self Care | Payer: Medicaid Other | Attending: Obstetrics & Gynecology | Admitting: Obstetrics & Gynecology

## 2021-01-19 ENCOUNTER — Encounter (HOSPITAL_COMMUNITY): Payer: Self-pay | Admitting: Obstetrics & Gynecology

## 2021-01-19 ENCOUNTER — Inpatient Hospital Stay (HOSPITAL_COMMUNITY): Payer: Medicaid Other

## 2021-01-19 DIAGNOSIS — N760 Acute vaginitis: Secondary | ICD-10-CM | POA: Diagnosis not present

## 2021-01-19 DIAGNOSIS — Z87891 Personal history of nicotine dependence: Secondary | ICD-10-CM | POA: Insufficient documentation

## 2021-01-19 DIAGNOSIS — O23591 Infection of other part of genital tract in pregnancy, first trimester: Secondary | ICD-10-CM | POA: Diagnosis not present

## 2021-01-19 DIAGNOSIS — Z8249 Family history of ischemic heart disease and other diseases of the circulatory system: Secondary | ICD-10-CM | POA: Diagnosis not present

## 2021-01-19 DIAGNOSIS — O26891 Other specified pregnancy related conditions, first trimester: Secondary | ICD-10-CM

## 2021-01-19 DIAGNOSIS — Z3A01 Less than 8 weeks gestation of pregnancy: Secondary | ICD-10-CM | POA: Insufficient documentation

## 2021-01-19 DIAGNOSIS — R109 Unspecified abdominal pain: Secondary | ICD-10-CM | POA: Diagnosis not present

## 2021-01-19 DIAGNOSIS — B9689 Other specified bacterial agents as the cause of diseases classified elsewhere: Secondary | ICD-10-CM | POA: Insufficient documentation

## 2021-01-19 DIAGNOSIS — Z79899 Other long term (current) drug therapy: Secondary | ICD-10-CM | POA: Diagnosis not present

## 2021-01-19 DIAGNOSIS — R1032 Left lower quadrant pain: Secondary | ICD-10-CM

## 2021-01-19 DIAGNOSIS — Z3490 Encounter for supervision of normal pregnancy, unspecified, unspecified trimester: Secondary | ICD-10-CM

## 2021-01-19 LAB — COMPREHENSIVE METABOLIC PANEL
ALT: 14 U/L (ref 0–44)
AST: 16 U/L (ref 15–41)
Albumin: 4.1 g/dL (ref 3.5–5.0)
Alkaline Phosphatase: 49 U/L (ref 38–126)
Anion gap: 8 (ref 5–15)
BUN: 9 mg/dL (ref 6–20)
CO2: 23 mmol/L (ref 22–32)
Calcium: 9.5 mg/dL (ref 8.9–10.3)
Chloride: 104 mmol/L (ref 98–111)
Creatinine, Ser: 0.77 mg/dL (ref 0.44–1.00)
GFR, Estimated: >60 mL/min (ref >60)
Glucose, Bld: 98 mg/dL (ref 70–99)
Potassium: 4.5 mmol/L (ref 3.5–5.1)
Sodium: 135 mmol/L (ref 135–145)
Total Bilirubin: 0.7 mg/dL (ref 0.3–1.2)
Total Protein: 7.1 g/dL (ref 6.5–8.1)

## 2021-01-19 LAB — URINALYSIS, ROUTINE W REFLEX MICROSCOPIC
Bilirubin Urine: NEGATIVE
Glucose, UA: NEGATIVE mg/dL
Hgb urine dipstick: NEGATIVE
Ketones, ur: NEGATIVE mg/dL
Leukocytes,Ua: NEGATIVE
Nitrite: NEGATIVE
Protein, ur: NEGATIVE mg/dL
Specific Gravity, Urine: 1.013 (ref 1.005–1.030)
pH: 6 (ref 5.0–8.0)

## 2021-01-19 LAB — CBC
HCT: 34.4 % — ABNORMAL LOW (ref 36.0–46.0)
Hemoglobin: 11.9 g/dL — ABNORMAL LOW (ref 12.0–15.0)
MCH: 31.2 pg (ref 26.0–34.0)
MCHC: 34.6 g/dL (ref 30.0–36.0)
MCV: 90.3 fL (ref 80.0–100.0)
Platelets: 276 10*3/uL (ref 150–400)
RBC: 3.81 MIL/uL — ABNORMAL LOW (ref 3.87–5.11)
RDW: 11.8 % (ref 11.5–15.5)
WBC: 4.6 10*3/uL (ref 4.0–10.5)
nRBC: 0 % (ref 0.0–0.2)

## 2021-01-19 LAB — WET PREP, GENITAL
Sperm: NONE SEEN
Trich, Wet Prep: NONE SEEN
Yeast Wet Prep HPF POC: NONE SEEN

## 2021-01-19 LAB — POCT PREGNANCY, URINE: Preg Test, Ur: POSITIVE — AB

## 2021-01-19 LAB — HCG, QUANTITATIVE, PREGNANCY: hCG, Beta Chain, Quant, S: 61542 m[IU]/mL — ABNORMAL HIGH (ref <5)

## 2021-01-19 MED ORDER — CVS PRENATAL GUMMY 0.4 MG PO CHEW: 1.0000 | CHEWABLE_TABLET | Freq: Every day | ORAL | 11 refills | Status: DC

## 2021-01-19 MED ORDER — METRONIDAZOLE 0.75 % VA GEL: 1.0000 | Freq: Two times a day (BID) | VAGINAL | 1 refills | Status: DC

## 2021-01-19 NOTE — MAU Note (Signed)
Pt reports intermittent LLQ pain that is sharp and cramping about 3-4x throughout the day. Denies VB. + home UPT. LMP 12/09/2020. Has not had first appt yet, [facility].

## 2021-01-19 NOTE — Discharge Instructions (Signed)

## 2021-01-19 NOTE — MAU Provider Note (Signed)
History     CSN: [id number]  Arrival date and time: 01/19/21 1955   Event Date/Time   First Provider Initiated Contact with Patient 01/19/21 2025      Chief Complaint  Patient presents with   Abdominal Pain    Left side   HPI Shari Davidson is a 30 y.o. [phone] in early pregnancy who presents to MAU with chief complaint of LLQ cramping.  Locus of pain is the left margin of the scar from her cesarean section. This is a new problem, onset 2-3 days ago. Patient has felt the "sharp" pain 3-4 times since onset. She denies vaginal bleeding, dysuria, abdominal tenderness, fever or recent illness.  OB History     Gravida  4   Para  1   Term  1   Preterm      AB  2   Living  1      SAB  2   IAB      Ectopic      Multiple  0   Live Births  1           Past Medical History:  Diagnosis Date   Asthma    Chlamydia 08/06/11   treated at Urgent Care   Gonorrhea 04/20/12   treated at Urgent Care   Migraine    Trichomonas contact, treated     Past Surgical History:  Procedure Laterality Date   CESAREAN SECTION  12/01/2019   Procedure: CESAREAN SECTION;  Surgeon: Malachy Chamber, MD;  Location: [facility];  Service: Obstetrics;;   NO PAST SURGERIES      Family History  Problem Relation Age of Onset   Hypertension Other    Healthy Mother    Healthy Father     Social History   Tobacco Use   Smoking status: Former    Packs/day: 0.30    Types: Cigarettes    Quit date: 08/30/2014    Years since quitting: 6.3   Smokeless tobacco: Never  Vaping Use   Vaping Use: Never used  Substance Use Topics   Alcohol use: Not Currently    Comment: OCCASIONAL   Drug use: No    Allergies: No Known Allergies  Medications Prior to Admission  Medication Sig Dispense Refill Last Dose   metroNIDAZOLE (METROGEL VAGINAL) 0.75 % vaginal gel Place 1 Applicatorful vaginally 2 (two) times daily. 70 g 0    Prenatal Vit-Fe Fumarate-FA (MULTIVITAMIN-PRENATAL) 27-0.8  MG TABS tablet Take 1 tablet by mouth daily at 12 noon.       Review of Systems  Gastrointestinal:  Positive for abdominal pain.  All other systems reviewed and are negative. Physical Exam   Blood pressure (!) 108/55, pulse 75, temperature 98.8 F (37.1 C), temperature source Oral, resp. rate 16, height 5\' 4"  (1.626 m), weight 59.6 kg, last menstrual period 12/09/2020, SpO2 99 %, not currently breastfeeding.  Physical Exam Vitals and nursing note reviewed. Exam conducted with a chaperone present.  Constitutional:      Appearance: She is well-developed. She is not ill-appearing.  Cardiovascular:     Rate and Rhythm: Normal rate.     Heart sounds: Normal heart sounds.  Pulmonary:     Effort: Pulmonary effort is normal.  Abdominal:     Palpations: Abdomen is soft.     Tenderness: There is no abdominal tenderness.  Genitourinary:    Comments: Deferred Skin:    General: Skin is warm and dry.     Capillary Refill: Capillary  refill takes less than 2 seconds.  Neurological:     Mental Status: She is alert.    MAU Course  Procedures  Patient Vitals for the past 24 hrs:  BP Temp Temp src Pulse Resp SpO2 Height Weight  01/19/21 2021 (!) 108/55 98.8 F (37.1 C) Oral 75 16 99 % 5\' 4"  (1.626 m) 59.6 kg   Orders Placed This Encounter  Procedures   Wet prep, genital   OB Comp Less 14 Wks   Urinalysis, Routine w reflex microscopic   CBC   Comprehensive metabolic panel   hCG, quantitative, pregnancy   Pregnancy, urine POC   Discharge patient   Results for orders placed or performed during the hospital encounter of 01/19/21 (from the past 24 hour(s))  Pregnancy, urine POC     Status: Abnormal   Collection Time: 01/19/21  8:16 PM  Result Value Ref Range   Preg Test, Ur POSITIVE (A) NEGATIVE  Urinalysis, Routine w reflex microscopic     Status: Abnormal   Collection Time: 01/19/21  8:18 PM  Result Value Ref Range   Color, Urine YELLOW YELLOW   APPearance HAZY (A) CLEAR    Specific Gravity, Urine 1.013 1.005 - 1.030   pH 6.0 5.0 - 8.0   Glucose, UA NEGATIVE NEGATIVE mg/dL   Hgb urine dipstick NEGATIVE NEGATIVE   Bilirubin Urine NEGATIVE NEGATIVE   Ketones, ur NEGATIVE NEGATIVE mg/dL   Protein, ur NEGATIVE NEGATIVE mg/dL   Nitrite NEGATIVE NEGATIVE   Leukocytes,Ua NEGATIVE NEGATIVE  Wet prep, genital     Status: Abnormal   Collection Time: 01/19/21  8:35 PM  Result Value Ref Range   Yeast Wet Prep HPF POC NONE SEEN NONE SEEN   Trich, Wet Prep NONE SEEN NONE SEEN   Clue Cells Wet Prep HPF POC PRESENT (A) NONE SEEN   WBC, Wet Prep HPF POC MANY (A) NONE SEEN   Sperm NONE SEEN   CBC     Status: Abnormal   Collection Time: 01/19/21  8:43 PM  Result Value Ref Range   WBC 4.6 4.0 - 10.5 K/uL   RBC 3.81 (L) 3.87 - 5.11 MIL/uL   Hemoglobin 11.9 (L) 12.0 - 15.0 g/dL   HCT 01/21/21 (L) 92.1 - 19.4 %   MCV 90.3 80.0 - 100.0 fL   MCH 31.2 26.0 - 34.0 pg   MCHC 34.6 30.0 - 36.0 g/dL   RDW 17.4 08.1 - 44.8 %   Platelets 276 150 - 400 K/uL   nRBC 0.0 0.0 - 0.2 %   18.5 OB Comp Less 14 Wks  Result Date: 01/19/2021 CLINICAL DATA:  Left lower quadrant pain, quantitative beta hCG pending EXAM: OBSTETRIC <14 WK ULTRASOUND TECHNIQUE: Transabdominal ultrasound was performed for evaluation of the gestation as well as the maternal uterus and adnexal regions. COMPARISON:  None. FINDINGS: Intrauterine gestational sac: Single Yolk sac:  Visualized. Embryo:  Visualized. Cardiac Activity: Visualized. Heart Rate: 122 bpm CRL:   6.8 mm   6 w 3 d                  01/21/2021 EDC: 09/11/2021 Subchorionic hemorrhage:  None visualized. Maternal uterus/adnexae: Anteverted and slightly anteflexed maternal uterus. No concerning uterine abnormality. Normal appearance of the ovaries with a probable left corpus luteum. No free fluid. IMPRESSION: Single intrauterine gestation at 6 weeks, 3 days by crown-rump length sonographic estimation. No acute sonographic complication. Electronically Signed   By: 09/13/2021 M.D.   On: 01/19/2021 21:18  Meds ordered this encounter  Medications   metroNIDAZOLE (METROGEL VAGINAL) 0.75 % vaginal gel    Sig: Place 1 Applicatorful vaginally 2 (two) times daily.    Dispense:  70 g    Refill:  1    Order Specific Question:   Supervising Provider    Answer:   Despina Hidden, LUTHER H [[id number]]   Prenatal Multivit-Min-FA ([employer] PRENATAL GUMMY) 0.4 MG CHEW    Sig: Chew 1 each by mouth at bedtime.    Dispense:  30 tablet    Refill:  11    Order Specific Question:   Supervising Provider    Answer:   Lazaro Arms [[id number]]    Assessment and Plan  --30 y.o. [id number] at [redacted]w[redacted]d  --Bacterial Vaginosis, metrogel per patient preference --Discharge home in stable condition  F/U: --Patient to select [facility] Provider, schedule initiation of care  Clayton Bibles, MSN, CNM Certified Nurse Midwife, [facility] for [employer], [facility] 01/19/21 10:11 PM

## 2021-01-20 LAB — GC/CHLAMYDIA PROBE AMP (~~LOC~~) NOT AT ARMC
Chlamydia: NEGATIVE
Comment: NEGATIVE
Comment: NORMAL
Neisseria Gonorrhea: NEGATIVE

## 2021-02-14 ENCOUNTER — Encounter: Payer: Medicaid Other | Admitting: Family Medicine

## 2021-02-19 ENCOUNTER — Inpatient Hospital Stay (HOSPITAL_COMMUNITY)
Admission: AD | Admit: 2021-02-19 | Discharge: 2021-02-19 | Disposition: A | Discharge status: Home / Self Care | Payer: Medicaid Other | Attending: Obstetrics and Gynecology | Admitting: Obstetrics and Gynecology

## 2021-02-19 ENCOUNTER — Other Ambulatory Visit: Payer: Self-pay

## 2021-02-19 ENCOUNTER — Encounter (HOSPITAL_COMMUNITY): Payer: Self-pay | Admitting: Obstetrics and Gynecology

## 2021-02-19 DIAGNOSIS — O99611 Diseases of the digestive system complicating pregnancy, first trimester: Secondary | ICD-10-CM

## 2021-02-19 DIAGNOSIS — M549 Dorsalgia, unspecified: Secondary | ICD-10-CM | POA: Diagnosis not present

## 2021-02-19 DIAGNOSIS — O26899 Other specified pregnancy related conditions, unspecified trimester: Secondary | ICD-10-CM

## 2021-02-19 DIAGNOSIS — R109 Unspecified abdominal pain: Secondary | ICD-10-CM

## 2021-02-19 DIAGNOSIS — O99891 Other specified diseases and conditions complicating pregnancy: Secondary | ICD-10-CM

## 2021-02-19 DIAGNOSIS — K5904 Chronic idiopathic constipation: Secondary | ICD-10-CM

## 2021-02-19 DIAGNOSIS — K5909 Other constipation: Secondary | ICD-10-CM | POA: Diagnosis not present

## 2021-02-19 DIAGNOSIS — Z87891 Personal history of nicotine dependence: Secondary | ICD-10-CM | POA: Diagnosis not present

## 2021-02-19 DIAGNOSIS — Z3A11 11 weeks gestation of pregnancy: Secondary | ICD-10-CM | POA: Diagnosis not present

## 2021-02-19 DIAGNOSIS — K5900 Constipation, unspecified: Secondary | ICD-10-CM

## 2021-02-19 DIAGNOSIS — O26891 Other specified pregnancy related conditions, first trimester: Secondary | ICD-10-CM | POA: Diagnosis present

## 2021-02-19 LAB — WET PREP, GENITAL
Clue Cells Wet Prep HPF POC: NONE SEEN
Sperm: NONE SEEN
Trich, Wet Prep: NONE SEEN
Yeast Wet Prep HPF POC: NONE SEEN

## 2021-02-19 LAB — URINALYSIS, ROUTINE W REFLEX MICROSCOPIC
Bilirubin Urine: NEGATIVE
Glucose, UA: NEGATIVE mg/dL
Hgb urine dipstick: NEGATIVE
Ketones, ur: NEGATIVE mg/dL
Leukocytes,Ua: NEGATIVE
Nitrite: NEGATIVE
Protein, ur: NEGATIVE mg/dL
Specific Gravity, Urine: 1.013 (ref 1.005–1.030)
pH: 6 (ref 5.0–8.0)

## 2021-02-19 MED ORDER — ACETAMINOPHEN 500 MG PO TABS
1000.0000 mg | ORAL_TABLET | Freq: Once | ORAL | Status: AC
  Administered 2021-02-19: 1000 mg via ORAL
  Filled 2021-02-19: qty 2

## 2021-02-19 MED ORDER — HYOSCYAMINE SULFATE 0.125 MG SL SUBL
0.1250 mg | SUBLINGUAL_TABLET | Freq: Once | SUBLINGUAL | Status: AC
  Administered 2021-02-19: 0.125 mg via SUBLINGUAL
  Filled 2021-02-19: qty 1

## 2021-02-19 NOTE — MAU Provider Note (Addendum)
History     CSN: [id number]  Arrival date and time: 02/19/21 1827   Event Date/Time   First Provider Initiated Contact with Patient 02/19/21 1918      Chief Complaint  Patient presents with   Back Pain   Abdominal Pain   HPI RN Note: Shari Davidson is a 30 y.o. at [redacted]w[redacted]d here in MAU reporting: back pain and abdominal cramping since last night. Had some pains in her chest last night but none currently. No bleeding. Is having some discharge, no odor or itching.     OB History     Gravida  4   Para  1   Term  1   Preterm      AB  2   Living  1      SAB  2   IAB      Ectopic      Multiple  0   Live Births  1           Past Medical History:  Diagnosis Date   Asthma    Chlamydia 08/06/11   treated at Urgent Care   Gonorrhea 04/20/12   treated at Urgent Care   Migraine    Trichomonas contact, treated     Past Surgical History:  Procedure Laterality Date   CESAREAN SECTION  12/01/2019   Procedure: CESAREAN SECTION;  Surgeon: Malachy Chamber, MD;  Location: [facility];  Service: Obstetrics;;   NO PAST SURGERIES      Family History  Problem Relation Age of Onset   Hypertension Other    Healthy Mother    Healthy Father     Social History   Tobacco Use   Smoking status: Former    Packs/day: 0.30    Types: Cigarettes    Quit date: 08/30/2014    Years since quitting: 6.4   Smokeless tobacco: Never  Vaping Use   Vaping Use: Never used  Substance Use Topics   Alcohol use: Not Currently    Comment: OCCASIONAL   Drug use: No    Allergies: No Known Allergies  Medications Prior to Admission  Medication Sig Dispense Refill Last Dose   metroNIDAZOLE (METROGEL VAGINAL) 0.75 % vaginal gel Place 1 Applicatorful vaginally 2 (two) times daily. 70 g 1    Prenatal Multivit-Min-FA ([employer] PRENATAL GUMMY) 0.4 MG CHEW Chew 1 each by mouth at bedtime. 30 tablet 11     Review of Systems  Constitutional: Negative.   HENT: Negative.    Eyes:  Negative.   Respiratory: Negative.    Cardiovascular: Negative.   Gastrointestinal: Negative.   Endocrine: Negative.   Genitourinary:  Positive for pelvic pain (6/10) and vaginal discharge. Negative for vaginal bleeding.  Musculoskeletal:  Positive for back pain (mid area; 7/10).  Skin: Negative.   Allergic/Immunologic: Negative.   Neurological: Negative.   Hematological: Negative.   Psychiatric/Behavioral: Negative.    Physical Exam   Blood pressure (!) 111/57, pulse 94, temperature 98.3 F (36.8 C), temperature source Oral, resp. rate 16, height 5\' 4"  (1.626 m), weight 59.3 kg, last menstrual period 12/09/2020, SpO2 100 %, not currently breastfeeding.  Physical Exam Vitals and nursing note reviewed. Exam conducted with a chaperone present.  Constitutional:      Appearance: Normal appearance. She is normal weight.  Cardiovascular:     Rate and Rhythm: Normal rate.  Pulmonary:     Effort: Pulmonary effort is normal.  Abdominal:     Tenderness: There is abdominal tenderness (lower RT  pelvis area).  Genitourinary:    General: Normal vulva.     Comments: Pelvic exam: External genitalia normal, SE: vaginal walls pink and well rugated, cervix is smooth, pink, no lesions, moderate amt of thick, white vaginal d/c -- WP, GC/CT done, cervix closed/thick/firm, Uterus is enlarged and non-tender, mild CMT, no friability, RT adnexal tenderness.  Musculoskeletal:        General: Normal range of motion.  Skin:    General: Skin is warm and dry.  Neurological:     Mental Status: She is alert and oriented to person, place, and time.  Psychiatric:        Mood and Affect: Mood normal.        Behavior: Behavior normal.        Thought Content: Thought content normal.        Judgment: Judgment normal.   FHTs by doppler: 166 bpm  MAU Course  Procedures  MDM CCUA  Wet Prep GC/CT -- Results pending  Tylenol 1000 mg po   Results for orders placed or performed during the hospital encounter of  02/19/21 (from the past 24 hour(s))  Urinalysis, Routine w reflex microscopic Urine, Clean Catch     Status: None   Collection Time: 02/19/21  6:56 PM  Result Value Ref Range   Color, Urine YELLOW YELLOW   APPearance CLEAR CLEAR   Specific Gravity, Urine 1.013 1.005 - 1.030   pH 6.0 5.0 - 8.0   Glucose, UA NEGATIVE NEGATIVE mg/dL   Hgb urine dipstick NEGATIVE NEGATIVE   Bilirubin Urine NEGATIVE NEGATIVE   Ketones, ur NEGATIVE NEGATIVE mg/dL   Protein, ur NEGATIVE NEGATIVE mg/dL   Nitrite NEGATIVE NEGATIVE   Leukocytes,Ua NEGATIVE NEGATIVE  Wet prep, genital     Status: Abnormal   Collection Time: 02/19/21  7:33 PM  Result Value Ref Range   Yeast Wet Prep HPF POC NONE SEEN NONE SEEN   Trich, Wet Prep NONE SEEN NONE SEEN   Clue Cells Wet Prep HPF POC NONE SEEN NONE SEEN   WBC, Wet Prep HPF POC MODERATE (A) NONE SEEN   Sperm NONE SEEN     Report given to and care assumed by M. Mayford Knife, CNM @ [address] 02/19/2021, 7:32 PM   Reassessed for pain after Tylenol, patient stated she still had lower abdominal cramping. Did report long history of constipation We gave her a dose of Levsin with good reduction in pain. 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   Aviva Signs, CNM

## 2021-02-19 NOTE — Discharge Instructions (Signed)

## 2021-02-19 NOTE — MAU Note (Signed)
Shari Davidson is a 30 y.o. at [redacted]w[redacted]d here in MAU reporting: back pain and abdominal cramping since last night. Had some pains in her chest last night but none currently. No bleeding. Is having some discharge, no odor or itching.   Onset of complaint: yesterday  Pain score: back 7/10, abdomen 6/10  Vitals:   02/19/21 1855  BP: (!) 111/57  Pulse: 94  Resp: 16  Temp: 98.3 F (36.8 C)  SpO2: 100%     FHT:166  Lab orders placed from triage: UA

## 2021-02-20 DIAGNOSIS — K5909 Other constipation: Secondary | ICD-10-CM | POA: Diagnosis present

## 2021-02-20 LAB — GC/CHLAMYDIA PROBE AMP (~~LOC~~) NOT AT ARMC
Chlamydia: NEGATIVE
Comment: NEGATIVE
Comment: NORMAL
Neisseria Gonorrhea: NEGATIVE

## 2021-02-26 ENCOUNTER — Other Ambulatory Visit: Payer: Self-pay

## 2021-02-26 ENCOUNTER — Encounter: Payer: Self-pay | Admitting: Family Medicine

## 2021-02-26 ENCOUNTER — Ambulatory Visit (INDEPENDENT_AMBULATORY_CARE_PROVIDER_SITE_OTHER): Payer: Medicaid Other | Admitting: Family Medicine

## 2021-02-26 VITALS — BP 122/60 | HR 87 | Wt 131.0 lb

## 2021-02-26 DIAGNOSIS — Z8632 Personal history of gestational diabetes: Secondary | ICD-10-CM

## 2021-02-26 DIAGNOSIS — Z3480 Encounter for supervision of other normal pregnancy, unspecified trimester: Secondary | ICD-10-CM

## 2021-02-26 DIAGNOSIS — O09291 Supervision of pregnancy with other poor reproductive or obstetric history, first trimester: Secondary | ICD-10-CM | POA: Diagnosis present

## 2021-02-26 DIAGNOSIS — Z3A11 11 weeks gestation of pregnancy: Secondary | ICD-10-CM | POA: Diagnosis not present

## 2021-02-26 DIAGNOSIS — O3429 Maternal care due to uterine scar from other previous surgery: Secondary | ICD-10-CM

## 2021-02-26 DIAGNOSIS — O219 Vomiting of pregnancy, unspecified: Secondary | ICD-10-CM

## 2021-02-26 DIAGNOSIS — Z3481 Encounter for supervision of other normal pregnancy, first trimester: Secondary | ICD-10-CM | POA: Diagnosis not present

## 2021-02-26 DIAGNOSIS — Z3400 Encounter for supervision of normal first pregnancy, unspecified trimester: Secondary | ICD-10-CM

## 2021-02-26 MED ORDER — DOXYLAMINE-PYRIDOXINE 10-10 MG PO TBEC: 1.0000 | DELAYED_RELEASE_TABLET | Freq: Three times a day (TID) | ORAL | 3 refills | Status: DC | PRN

## 2021-02-26 NOTE — Progress Notes (Signed)
Patient Name: Shari Davidson Date of Birth: 1991-04-07 [facility] Initial Prenatal Visit  Shari Davidson is a 30 y.o. year old [id number] at [redacted]w[redacted]d who presents for her initial prenatal visit. Pregnancy is not planned She reports fatigue, nausea, and positive home pregnancy test. She is taking a prenatal vitamin.  She denies pelvic pain or vaginal bleeding.   Pregnancy Dating: The patient is dated by early ultrasound [redacted]w[redacted]d on 01/19/21.  LMP: 12/05/20 Period is certain:  Yes.  Periods were regular:  Yes.  LMP was a typical period:  Yes.  Using hormonal contraception in 3 months prior to conception: No  Lab Review: Blood type: Conflict (See Lab Report): O POS/O POS Performed at [facility], [address] [place], [place] [address]  Rh Status: + Antibody screen: Negative HIV: Negative RPR: Negative Hemoglobin electrophoresis reviewed: No, ordered today.  Results of OB urine culture are: Not done. Ordered today.  Rubella: Immune Varicella status is Immune  PMH: Reviewed and as detailed below: HTN: No  Type 1 or 2 Diabetes: No  Depression:  No  Seizure disorder:  No VTE: No ,  History of STI Yes, gonrrhea  Abnormal Pap smear:  No, last Dec 2020  Genital herpes simplex:  No   PSH: Gynecologic Surgery: emergency C-section for fetal bradycardia  Surgical history reviewed, notable for: CS, otherwise none  Obstetric History: Obstetric history tab updated and reviewed.  Summary of prior pregnancies:  - Two miscarriages: #1 was 5wk in 2016, #3 was 6wk in March 2022  - #2 Daughter: C-section for recurrent deep late decelerations,  infant 6lbs 4oz Cesarean delivery: Yes  Gestational Diabetes:  Yes Hypertension in pregnancy: No History of preterm birth: No History of LGA/SGA infant:  No History of shoulder dystocia: No Indications for referral were reviewed, and the patient has no obstetric indications for referral to High  Risk OB Clinic at this time.   Social History: Partner's name: Shari Davidson   Tobacco use: No Alcohol use:  Yes, none since finding out she was pregnant  Other substance use:  No  Current Medications:  Prenatal vitamins   Reviewed and appropriate in pregnancy.   Genetic and Infection Screen: Flow Sheet Updated Yes  Prenatal Exam: Gen: Well nourished, well developed.  No distress.  Vitals noted. HEENT: Normocephalic, atraumatic.  Neck supple without cervical lymphadenopathy, thyromegaly or thyroid nodules.  Fair dentition. CV: RRR no murmur, gallops or rubs Lungs: CTA B.  Normal respiratory effort without wheezes or rales. Abd: soft, NTND. +BS.  Uterus not appreciated above pelvis. GU: deferred completed on 02/19/21 at MAU .   Ext: No clubbing, cyanosis or edema. Psych: Normal grooming and dress.  Not depressed or anxious appearing.  Normal thought content and process without flight of ideas or looseness of associations  Fetal heart tones: Appropriate  Assessment/Plan:  Shari Davidson is a 30 y.o. [phone] at [redacted]w[redacted]d who presents to initiate prenatal care. She is doing well.  Current pregnancy issues include history of GDM, nausea.  Routine prenatal care: As dating is reliable, a dating ultrasound has not been ordered. Dating tab updated. Pre-pregnancy weight updated. Expected weight gain this pregnancy is 15-25 pounds Prenatal labs reviewed, notable for none. Indications for referral to [facility] were reviewed and the patient does not meet criteria for referral.  Medication list reviewed and updated.  Recommended patient see a dentist for regular care.  Bleeding and pain precautions reviewed. Importance of  prenatal vitamins reviewed.  Genetic screening offered. Patient opted for: quad screen at 16-20 weeks. The patient will not be age 65 or over at time of delivery. Referral to genetic counseling was not offered today. Pt to return for 1hr GTT early next week as  scheduled.  The patient has the following risk factors for preexisting diabetes: BMI >25 and history of GDM . An early 1 hour glucose tolerance test was ordered. Pregnancy Medical Home and PHQ-9 forms completed, problems noted: Yes  2. Chronic Constipation: Pt seen at MAU for LLQ abdominal pain. No GI referral warranted at this time.  Reviewed Miralax clean out and maintenance therapy.   3. History of diet controlled -GDM.  Early 1hr GTT  4. Nausea.  Diclegis Rx sent to pharmacy. Alternative methods discussed. Take prenatal vitamins with food.    Follow up action items.  ASA for pre-E ppx at 12 weeks  Follow up 1hr GTT Monitor mood as pt has hx of depression  Ensure pt is taking prenatals regularly    Follow up 4 weeks for next prenatal visit.  Katha Cabal, DO

## 2021-02-26 NOTE — Patient Instructions (Addendum)
First Trimester of Pregnancy °The first trimester of pregnancy starts on the first day of your last menstrual period until the end of week 12. This is months 1 through 3 of pregnancy. A week after a sperm fertilizes an egg, the egg will implant into the wall of the uterus and begin to develop into a baby. By the end of 12 weeks, all the baby's organs will be formed and the baby will be 2-3 inches in size. °Body changes during your first trimester °Your body goes through many changes during pregnancy. The changes vary and generally return to normal after your baby is born. °Physical changes °You may gain or lose weight. °Your breasts may begin to grow larger and become tender. The tissue that surrounds your nipples (areola) may become darker. °Dark spots or blotches (chloasma or mask of pregnancy) may develop on your face. °You may have changes in your hair. These can include thickening or thinning of your hair or changes in texture. °Health changes °You may feel nauseous, and you may vomit. °You may have heartburn. °You may develop headaches. °You may develop constipation. °Your gums may bleed and may be sensitive to brushing and flossing. °Other changes °You may tire easily. °You may urinate more often. °Your menstrual periods will stop. °You may have a loss of appetite. °You may develop cravings for certain kinds of food. °You may have changes in your emotions from day to day. °You may have more vivid and strange dreams. °Follow these instructions at home: °Medicines °Follow your health care provider's instructions regarding medicine use. Specific medicines may be either safe or unsafe to take during pregnancy. Do not take any medicines unless told to by your health care provider. °Take a prenatal vitamin that contains at least 600 micrograms (mcg) of folic acid. °Eating and drinking °Eat a healthy diet that includes fresh fruits and vegetables, whole grains, good sources of protein such as meat, eggs, or tofu,  and low-fat dairy products. °Avoid raw meat and unpasteurized juice, milk, and cheese. These carry germs that can harm you and your baby. °If you feel nauseous or you vomit: °Eat 4 or 5 small meals a day instead of 3 large meals. °Try eating a few soda crackers. °Drink liquids between meals instead of during meals. °You may need to take these actions to prevent or treat constipation: °Drink enough fluid to keep your urine pale yellow. °Eat foods that are high in fiber, such as beans, whole grains, and fresh fruits and vegetables. °Limit foods that are high in fat and processed sugars, such as fried or sweet foods. °Activity °Exercise only as directed by your health care provider. Most people can continue their usual exercise routine during pregnancy. Try to exercise for 30 minutes at least 5 days a week. °Stop exercising if you develop pain or cramping in the lower abdomen or lower back. °Avoid exercising if it is very hot or humid or if you are at high altitude. °Avoid heavy lifting. °If you choose to, you may have sex unless your health care provider tells you not to. °Relieving pain and discomfort °Wear a good support bra to relieve breast tenderness. °Rest with your legs elevated if you have leg cramps or low back pain. °If you develop bulging veins (varicose veins) in your legs: °Wear support hose as told by your health care provider. °Elevate your feet for 15 minutes, 3-4 times a day. °Limit salt in your diet. °Safety °Wear your seat belt at all times when driving   or riding in a car. °Talk with your health care provider if someone is verbally or physically abusive to you. °Talk with your health care provider if you are feeling sad or have thoughts of hurting yourself. °Lifestyle °Do not use hot tubs, steam rooms, or saunas. °Do not douche. Do not use tampons or scented sanitary pads. °Do not use herbal remedies, alcohol, illegal drugs, or medicines that are not approved by your health care provider. Chemicals  in these products can harm your baby. °Do not use any products that contain nicotine or tobacco, such as cigarettes, e-cigarettes, and chewing tobacco. If you need help quitting, ask your health care provider. °Avoid cat litter boxes and soil used by cats. These carry germs that can cause birth defects in the baby and possibly loss of the unborn baby (fetus) by miscarriage or stillbirth. °General instructions °During routine prenatal visits in the first trimester, your health care provider will do a physical exam, perform necessary tests, and ask you how things are going. Keep all follow-up visits. This is important. °Ask for help if you have counseling or nutritional needs during pregnancy. Your health care provider can offer advice or refer you to specialists for help with various needs. °Schedule a dentist appointment. At home, brush your teeth with a soft toothbrush. Floss gently. °Write down your questions. Take them to your prenatal visits. °Where to find more information °American Pregnancy Association: americanpregnancy.org °American College of Obstetricians and Gynecologists: acog.org/en/Womens%20Health/Pregnancy °Office on Women's Health: womenshealth.gov/pregnancy °Contact a health care provider if you have: °Dizziness. °A fever. °Mild pelvic cramps, pelvic pressure, or nagging pain in the abdominal area. °Nausea, vomiting, or diarrhea that lasts for 24 hours or longer. °A bad-smelling vaginal discharge. °Pain when you urinate. °Known exposure to a contagious illness, such as chickenpox, measles, Zika virus, HIV, or hepatitis. °Get help right away if you have: °Spotting or bleeding from your vagina. °Severe abdominal cramping or pain. °Shortness of breath or chest pain. °Any kind of trauma, such as from a fall or a car crash. °New or increased pain, swelling, or redness in an arm or leg. °Summary °The first trimester of pregnancy starts on the first day of your last menstrual period until the end of week  12 (months 1 through 3). °Eating 4 or 5 small meals a day rather than 3 large meals may help to relieve nausea and vomiting. °Do not use any products that contain nicotine or tobacco, such as cigarettes, e-cigarettes, and chewing tobacco. If you need help quitting, ask your health care provider. °Keep all follow-up visits. This is important. °This information is not intended to replace advice given to you by your health care provider. Make sure you discuss any questions you have with your health care provider. °Document Revised: 11/22/2019 Document Reviewed: 09/28/2019 °Elsevier Patient Education © 2022 Elsevier Inc. ° °

## 2021-02-28 LAB — URINE CULTURE, OB REFLEX

## 2021-02-28 LAB — CULTURE, OB URINE

## 2021-02-28 NOTE — Progress Notes (Deleted)
   SUBJECTIVE:   CHIEF COMPLAINT / HPI:   Chief Complaint  Patient presents with  . Constipation  . Diarrhea     Shari Davidson is a 30 y.o. female here for ***   Pt reports ***    PERTINENT  PMH / PSH: reviewed and updated as appropriate   OBJECTIVE:   BP 122/60   Pulse 87   Wt 131 lb (59.4 kg)   LMP 12/09/2020   BMI 22.49 kg/m   ***  ASSESSMENT/PLAN:   No problem-specific Assessment & Plan notes found for this encounter.     Katha Cabal, DO PGY-3, [facility] 02/28/2021      {    This will disappear when note is signed, click to select method of visit    :1}

## 2021-03-04 ENCOUNTER — Other Ambulatory Visit: Payer: Medicaid Other

## 2021-03-05 LAB — OBSTETRIC PANEL, INCLUDING HIV
Antibody Screen: NEGATIVE
Basophils Absolute: 0 10*3/uL (ref 0.0–0.2)
Basos: 1 %
EOS (ABSOLUTE): 0.1 10*3/uL (ref 0.0–0.4)
Eos: 1 %
HIV Screen 4th Generation wRfx: NONREACTIVE
Hematocrit: 33.9 % — ABNORMAL LOW (ref 34.0–46.6)
Hemoglobin: 11.6 g/dL (ref 11.1–15.9)
Hepatitis B Surface Ag: NEGATIVE
Immature Grans (Abs): 0 10*3/uL (ref 0.0–0.1)
Immature Granulocytes: 0 %
Lymphocytes Absolute: 1.2 10*3/uL (ref 0.7–3.1)
Lymphs: 26 %
MCH: 31.5 pg (ref 26.6–33.0)
MCHC: 34.2 g/dL (ref 31.5–35.7)
MCV: 92 fL (ref 79–97)
Monocytes Absolute: 0.3 10*3/uL (ref 0.1–0.9)
Monocytes: 7 %
Neutrophils Absolute: 3 10*3/uL (ref 1.4–7.0)
Neutrophils: 65 %
Platelets: 254 10*3/uL (ref 150–450)
RBC: 3.68 x10E6/uL — ABNORMAL LOW (ref 3.77–5.28)
RDW: 12.9 % (ref 11.7–15.4)
RPR Ser Ql: NONREACTIVE
Rh Factor: POSITIVE
Rubella Antibodies, IGG: 1.76 index (ref >0.99)
WBC: 4.6 10*3/uL (ref 3.4–10.8)

## 2021-03-05 LAB — HGB FRAC BY HPLC+SOLUBILITY
Hgb A: 61.9 % — ABNORMAL LOW (ref 96.4–98.8)
Hgb C: 0 %
Hgb E: 0 %
Hgb F: 0 % (ref 0.0–2.0)
Hgb S: 34.9 % — ABNORMAL HIGH
Hgb Solubility: POSITIVE — AB
Hgb Variant: 1.5 % — ABNORMAL HIGH

## 2021-03-05 LAB — HGB FRACTIONATION CASCADE: Hgb A2: 1.7 % — ABNORMAL LOW (ref 1.8–3.2)

## 2021-03-05 LAB — HCV INTERPRETATION

## 2021-03-05 LAB — HCV AB W REFLEX TO QUANT PCR: HCV Ab: <0.1 s/co ratio (ref 0.0–0.9)

## 2021-03-13 ENCOUNTER — Encounter (HOSPITAL_COMMUNITY): Payer: Self-pay | Admitting: Obstetrics & Gynecology

## 2021-03-13 ENCOUNTER — Inpatient Hospital Stay (HOSPITAL_COMMUNITY)
Admission: AD | Admit: 2021-03-13 | Discharge: 2021-03-13 | Disposition: A | Discharge status: Home / Self Care | Payer: Medicaid Other | Attending: Obstetrics & Gynecology | Admitting: Obstetrics & Gynecology

## 2021-03-13 DIAGNOSIS — R1110 Vomiting, unspecified: Secondary | ICD-10-CM

## 2021-03-13 DIAGNOSIS — R110 Nausea: Secondary | ICD-10-CM

## 2021-03-13 DIAGNOSIS — R198 Other specified symptoms and signs involving the digestive system and abdomen: Secondary | ICD-10-CM

## 2021-03-13 DIAGNOSIS — Z87891 Personal history of nicotine dependence: Secondary | ICD-10-CM | POA: Insufficient documentation

## 2021-03-13 DIAGNOSIS — O219 Vomiting of pregnancy, unspecified: Secondary | ICD-10-CM | POA: Diagnosis not present

## 2021-03-13 DIAGNOSIS — Z3A14 14 weeks gestation of pregnancy: Secondary | ICD-10-CM

## 2021-03-13 DIAGNOSIS — O26892 Other specified pregnancy related conditions, second trimester: Secondary | ICD-10-CM

## 2021-03-13 LAB — URINALYSIS, ROUTINE W REFLEX MICROSCOPIC
Bilirubin Urine: NEGATIVE
Glucose, UA: NEGATIVE mg/dL
Hgb urine dipstick: NEGATIVE
Ketones, ur: NEGATIVE mg/dL
Nitrite: NEGATIVE
Protein, ur: NEGATIVE mg/dL
Specific Gravity, Urine: 1.015 (ref 1.005–1.030)
pH: 6 (ref 5.0–8.0)

## 2021-03-13 MED ORDER — ONDANSETRON 4 MG PO TBDP
4.0000 mg | ORAL_TABLET | Freq: Once | ORAL | Status: AC
  Administered 2021-03-13: 4 mg via ORAL
  Filled 2021-03-13: qty 1

## 2021-03-13 MED ORDER — PROMETHAZINE HCL 25 MG PO TABS: 25.0000 mg | ORAL_TABLET | Freq: Four times a day (QID) | ORAL | 2 refills | Status: DC | PRN

## 2021-03-13 NOTE — MAU Provider Note (Signed)
Chief Complaint: Emesis   Event Date/Time   First Provider Initiated Contact with Patient 03/13/21 2211        SUBJECTIVE HPI: Shari Davidson is a 30 y.o. [id number] at [redacted]w[redacted]d by LMP who presents to maternity admissions reporting dry heaves for a week.  Was prescribed Diclegis but never picked it up "because I am not vomiting".  . She denies vaginal bleeding, vaginal itching/burning, urinary symptoms, h/a, dizziness, or fever/chills.    Emesis  This is a recurrent problem. The current episode started 1 to 4 weeks ago. The problem has been unchanged. There has been no fever. Pertinent negatives include no abdominal pain, chills, diarrhea, dizziness, fever, headaches or myalgias. She has tried nothing for the symptoms.   RN Note: Pt reports she has been having dry heaves not able to eat or sleep. Symptoms for the last week. Reports some cramping as well. Denies amy vag bleeding or discharge. Has been prescribed some medication for nausea but has not picked it up because she is not vomiting just dry heaves.   Past Medical History:  Diagnosis Date   Asthma    Chlamydia 08/06/11   treated at Urgent Care   Gonorrhea 04/20/12   treated at Urgent Care   Migraine    Trichomonas contact, treated    Past Surgical History:  Procedure Laterality Date   CESAREAN SECTION  12/01/2019   Procedure: CESAREAN SECTION;  Surgeon: Malachy Chamber, MD;  Location: [facility];  Service: Obstetrics;;   NO PAST SURGERIES     Social History   Socioeconomic History   Marital status: Single    Spouse name: Not on file   Number of children: Not on file   Years of education: Not on file   Highest education level: High school graduate  Occupational History   Not on file  Tobacco Use   Smoking status: Former    Packs/day: 0.30    Types: Cigarettes    Quit date: 08/30/2014    Years since quitting: 6.5   Smokeless tobacco: Never  Vaping Use   Vaping Use: Never used  Substance and Sexual Activity    Alcohol use: Not Currently    Comment: OCCASIONAL   Drug use: No   Sexual activity: Yes    Birth control/protection: None  Other Topics Concern   Not on file  Social History Narrative    Lives with mother and 2 sisters   Social Determinants of Health   Financial Resource Strain: Not on file  Food Insecurity: Not on file  Transportation Needs: Not on file  Physical Activity: Not on file  Stress: Not on file  Social Connections: Not on file  Intimate Partner Violence: Not on file   No current facility-administered medications on file prior to encounter.   Current Outpatient Medications on File Prior to Encounter  Medication Sig Dispense Refill   Prenatal Multivit-Min-FA ([employer] PRENATAL GUMMY) 0.4 MG CHEW Chew 1 each by mouth at bedtime. 30 tablet 11   Doxylamine-Pyridoxine 10-10 MG TBEC Take 1 tablet by mouth 3 (three) times daily as needed. 1 tablet orally at bedtime on day 1 and 2; if symptoms persist, take 1 tablet in morning and 2 tablets at bedtime on day 3; if symptoms persist, may increase to MAX 4 tablets per day, administered as 1 tablet in the morning, 1 tablet in mid-afternoon and 2 tablets at bedtim 60 tablet 3   metroNIDAZOLE (METROGEL VAGINAL) 0.75 % vaginal gel Place 1 Applicatorful vaginally 2 (  two) times daily. 70 g 1   No Known Allergies  I have reviewed patient's Past Medical Hx, Surgical Hx, Family Hx, Social Hx, medications and allergies.   ROS:  Review of Systems  Constitutional:  Negative for chills and fever.  Gastrointestinal:  Positive for vomiting. Negative for abdominal pain and diarrhea.  Musculoskeletal:  Negative for myalgias.  Neurological:  Negative for dizziness and headaches.  Review of Systems  Other systems negative   Physical Exam  Physical Exam Patient Vitals for the past 24 hrs:  BP Temp Pulse Resp Height Weight  03/13/21 2040 110/65 98.6 F (37 C) 99 18 5\' 4"  (1.626 m) 58.5 kg   Constitutional: Well-developed, well-nourished female  in no acute distress.  Cardiovascular: normal rate Respiratory: normal effort GI: Abd soft, non-tender. Pos BS x 4 MS: Extremities nontender, no edema, normal ROM Neurologic: Alert and oriented x 4.  GU: Neg CVAT.  FHT 154 by doppler  LAB RESULTS Results for orders placed or performed during the hospital encounter of 03/13/21 (from the past 24 hour(s))  Urinalysis, Routine w reflex microscopic Urine, Clean Catch     Status: Abnormal   Collection Time: 03/13/21  8:49 PM  Result Value Ref Range   Color, Urine YELLOW YELLOW   APPearance HAZY (A) CLEAR   Specific Gravity, Urine 1.015 1.005 - 1.030   pH 6.0 5.0 - 8.0   Glucose, UA NEGATIVE NEGATIVE mg/dL   Hgb urine dipstick NEGATIVE NEGATIVE   Bilirubin Urine NEGATIVE NEGATIVE   Ketones, ur NEGATIVE NEGATIVE mg/dL   Protein, ur NEGATIVE NEGATIVE mg/dL   Nitrite NEGATIVE NEGATIVE   Leukocytes,Ua SMALL (A) NEGATIVE   RBC / HPF 0-5 0 - 5 RBC/hpf   WBC, UA 0-5 0 - 5 WBC/hpf   Bacteria, UA MANY (A) NONE SEEN   Squamous Epithelial / LPF 0-5 0 - 5   Mucus PRESENT     O/Positive/-- (08/31 1223)  IMAGING No results found.  MAU Management/MDM: Reviewed UA which shows no evidence for dehydration Ordered .Zofran ODT for nausea She had good relief with this Able to tolerate PO intake Discussed importance of filling Rx and taking as directed  ASSESSMENT Single IUP at [redacted]w[redacted]d Nausea Dry heaves  PLAN Discharge home Rx Phenergan sent so pt could have a back up medication for nausea  Pt stable at time of discharge. Encouraged to return here if she develops worsening of symptoms, increase in pain, fever, or other concerning symptoms.    [redacted]w[redacted]d CNM, MSN Certified Nurse-Midwife 03/13/2021  10:12 PM

## 2021-03-13 NOTE — MAU Note (Addendum)
Pt reports she has been having dry heaves not able to eat or sleep. Symptoms for the last week. Reports some cramping as well. Denies amy vag bleeding or discharge. Has been prescribed some medication for nausea but has not picked it up because she is not vomiting just dry heaves.

## 2021-03-15 LAB — CULTURE, OB URINE

## 2021-03-21 ENCOUNTER — Other Ambulatory Visit: Payer: Self-pay

## 2021-03-21 ENCOUNTER — Ambulatory Visit (INDEPENDENT_AMBULATORY_CARE_PROVIDER_SITE_OTHER): Payer: Medicaid Other | Admitting: Obstetrics and Gynecology

## 2021-03-21 ENCOUNTER — Encounter: Payer: Self-pay | Admitting: Obstetrics and Gynecology

## 2021-03-21 DIAGNOSIS — Z3400 Encounter for supervision of normal first pregnancy, unspecified trimester: Secondary | ICD-10-CM

## 2021-03-21 DIAGNOSIS — K5909 Other constipation: Secondary | ICD-10-CM

## 2021-03-21 MED ORDER — FAMOTIDINE 20 MG PO TABS: 20.0000 mg | ORAL_TABLET | Freq: Two times a day (BID) | ORAL | 3 refills | Status: DC

## 2021-03-21 NOTE — Progress Notes (Signed)
   PRENATAL VISIT NOTE  Subjective:  Shari Davidson is a 30 y.o. [phone] at [redacted]w[redacted]d being seen today for ongoing prenatal care.  She is currently monitored for the following issues for this low-risk pregnancy and has Migraine headache; MYOPIA; RHINITIS, ALLERGIC; Asthma; Depression, major, recurrent, moderate (HCC); Supervision of normal first pregnancy, antepartum; Sickle cell trait (HCC); Cesarean delivery delivered; and Chronic constipation on their problem list.  Patient reports  intermittent dry heaves, occasional cramping .  Contractions: Not present. Vag. Bleeding: None.  Movement: Absent. Denies leaking of fluid.   The following portions of the patient's history were reviewed and updated as appropriate: allergies, current medications, past family history, past medical history, past social history, past surgical history and problem list.   Objective:   Vitals:   03/21/21 0913  BP: 113/66  Pulse: 78  Weight: 127 lb 14.4 oz (58 kg)    Fetal Status: Fetal Heart Rate (bpm): 156   Movement: Absent     General:  Alert, oriented and cooperative. Patient is in no acute distress.  Skin: Skin is warm and dry. No rash noted.   Cardiovascular: Normal heart rate noted  Respiratory: Normal respiratory effort, no problems with respiration noted  Abdomen: Soft, gravid, appropriate for gestational age.  Pain/Pressure: Present     Pelvic: Cervical exam deferred        Extremities: Normal range of motion.  Edema: None  Mental Status: Normal mood and affect. Normal behavior. Normal judgment and thought content.   Assessment and Plan:  Pregnancy: G4P1021 at [redacted]w[redacted]d 1. Supervision of normal first pregnancy, antepartum - She has h/o GDMA1 in first pregnancy. Will do early 1 hr.  - Sickle cell trait noted from [facility] - Will do pepcid for possible heart burn - Reviewed AFP and anatomy scan  - Check Ucx for cramping.  - Pap utd - normal in 05/2019   - Glucose Tolerance, 1 Hour - Genetic  Screening - Culture, OB Urine - [place] MFM OB DETAIL +14 WK; Future - AFP, Serum, Open Spina Bifida - famotidine (PEPCID) 20 MG tablet; Take 1 tablet (20 mg total) by mouth 2 (two) times daily.  Dispense: 60 tablet; Refill: 3  2. Cesarean delivery delivered - Desires TOLAC - Reviewed birth history - CD due to NRFHT. She is a good candidiate. Prior delivery was in 2020.  - Discussed risks of TOLAC, namely that of uterine rupture and potential consequences for her and baby. Discussed while low risk of these occurring they can be potentially catastrophic.  3. Chronic constipation - Discussed OTC measures to help  Preterm labor symptoms and general obstetric precautions including but not limited to vaginal bleeding, contractions, leaking of fluid and fetal movement were reviewed in detail with the patient. Please refer to After Visit Summary for other counseling recommendations.   No follow-ups on file.  No future appointments.  Milas Hock, MD

## 2021-03-23 LAB — AFP, SERUM, OPEN SPINA BIFIDA
AFP MoM: 1.01
AFP Value: 38.6 ng/mL
Gest. Age on Collection Date: 15.1 weeks
Maternal Age At EDD: 30 yr
OSBR Risk 1 IN: 10000
Test Results:: NEGATIVE
Weight: 128 [lb_av]

## 2021-03-23 LAB — GLUCOSE TOLERANCE, 1 HOUR: Glucose, 1Hr PP: 110 mg/dL (ref 65–199)

## 2021-03-23 LAB — URINE CULTURE, OB REFLEX

## 2021-03-23 LAB — CULTURE, OB URINE

## 2021-04-04 ENCOUNTER — Encounter: Payer: Self-pay | Admitting: Radiology

## 2021-04-15 ENCOUNTER — Encounter: Payer: Self-pay | Admitting: General Practice

## 2021-04-17 ENCOUNTER — Encounter: Payer: Self-pay | Admitting: *Deleted

## 2021-04-17 ENCOUNTER — Ambulatory Visit: Payer: Medicaid Other | Admitting: *Deleted

## 2021-04-17 ENCOUNTER — Other Ambulatory Visit: Payer: Self-pay

## 2021-04-17 ENCOUNTER — Ambulatory Visit: Payer: Medicaid Other | Attending: Obstetrics and Gynecology

## 2021-04-17 VITALS — BP 110/59 | HR 85

## 2021-04-17 DIAGNOSIS — Z3689 Encounter for other specified antenatal screening: Secondary | ICD-10-CM

## 2021-04-17 DIAGNOSIS — Z3400 Encounter for supervision of normal first pregnancy, unspecified trimester: Secondary | ICD-10-CM | POA: Insufficient documentation

## 2021-04-21 ENCOUNTER — Ambulatory Visit (INDEPENDENT_AMBULATORY_CARE_PROVIDER_SITE_OTHER): Payer: Medicaid Other | Admitting: Family Medicine

## 2021-04-21 ENCOUNTER — Other Ambulatory Visit: Payer: Self-pay

## 2021-04-21 VITALS — BP 112/58 | HR 80 | Wt 135.4 lb

## 2021-04-21 DIAGNOSIS — Z3A19 19 weeks gestation of pregnancy: Secondary | ICD-10-CM

## 2021-04-21 DIAGNOSIS — Z3400 Encounter for supervision of normal first pregnancy, unspecified trimester: Secondary | ICD-10-CM

## 2021-04-21 NOTE — Progress Notes (Signed)
Patient wants to know if it is ok to drink "protein shakes" while pregnant

## 2021-04-21 NOTE — Progress Notes (Signed)
   Subjective:  Shari Davidson is a 30 y.o. [phone] at [redacted]w[redacted]d being seen today for ongoing prenatal care.  She is currently monitored for the following issues for this low-risk pregnancy and has Migraine headache; MYOPIA; Asthma; Depression, major, recurrent, moderate (HCC); Supervision of normal first pregnancy, antepartum; Sickle cell trait (HCC); Cesarean delivery delivered; and Chronic constipation on their problem list.  Patient thinks she wants to [facility] but still deciding.  Patient reports no complaints.  Contractions: Not present. Vag. Bleeding: None.  Movement: Present. Denies leaking of fluid.   The following portions of the patient's history were reviewed and updated as appropriate: allergies, current medications, past family history, past medical history, past social history, past surgical history and problem list. Problem list updated.  Objective:   Vitals:   04/21/21 1022  BP: (!) 112/58  Pulse: 80  Weight: 135 lb 6.4 oz (61.4 kg)    Fetal Status: Fetal Heart Rate (bpm): 152   Movement: Present     General:  Alert, oriented and cooperative. Patient is in no acute distress.  Skin: Skin is warm and dry. No rash noted.   Cardiovascular: Normal heart rate noted  Respiratory: Normal respiratory effort, no problems with respiration noted  Abdomen: Soft, gravid, appropriate for gestational age. Pain/Pressure: Absent     Pelvic: Vag. Bleeding: None     Cervical exam deferred        Extremities: Normal range of motion.  Edema: None  Mental Status: Normal mood and affect. Normal behavior. Normal judgment and thought content.    Assessment and Plan:  Pregnancy: G4P1021 at [redacted]w[redacted]d  1. Supervision of normal first pregnancy, antepartum 2. [redacted] weeks gestation of pregnancy Doing well. No acute concerns. Asking if she can drink protein shakes. We discussed looking at nutrition label (and pulled up nutrition label photo) and making sure sugar content of protein shake is not too  high. Taking PNV. Anatomy US normal - Follow up in 4 weeks  3. Cesarean delivery delivered Prior CS in 11/2019 for fetal intolerance of labor while remote from delivery. Patient considering TOLAC.  - Discussed that she is a candidate for TOLAC and that there plenty of pregnant patients who successfully have a VBAC after CS. However, we also discussed the risks including risk of uterine rupture and that being an emergency that would require an emergent CS. Patient would like to think more about her options.  4. Contraception counseling Patient reports she likely desires further pregnancies in the future however would like to space her pregnancies out. Considering POPs or OCPs.    Preterm labor symptoms and general obstetric precautions including but not limited to vaginal bleeding, contractions, leaking of fluid and fetal movement were reviewed in detail with the patient. Please refer to After Visit Summary for other counseling recommendations.  Return in about 4 weeks (around 05/19/2021) for LROB, CNM if possible.  Warner Mccreedy, MD, MPH OB Fellow, Faculty Practice

## 2021-04-22 IMAGING — US US OB TRANSVAGINAL
1 series · 15 of 28 positions shown · non-contrast
Comparison: April 08, 2019.

CLINICAL DATA: Pregnancy of unknown location.

EXAM:
TRANSVAGINAL OB ULTRASOUND
TECHNIQUE: Transvaginal ultrasound was performed for complete evaluation of the
gestation as well as the maternal uterus, adnexal regions, and
pelvic cul-de-sac.

[Series 1: us ob transvaginal · 15 of 39 slices shown]
[im 1/39]
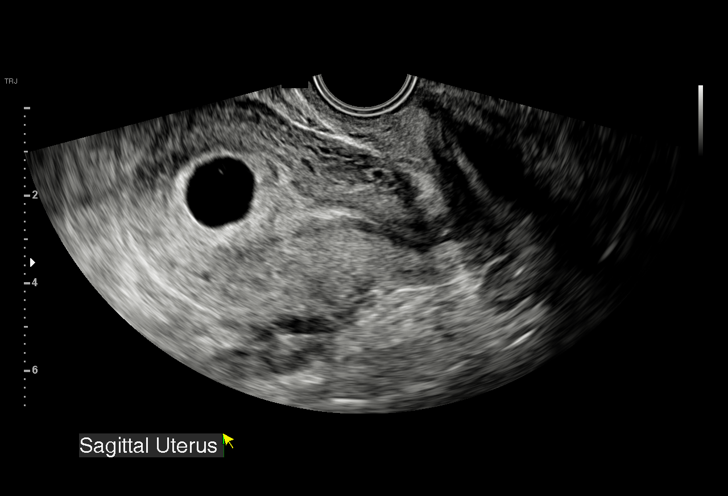
[im 3/39]
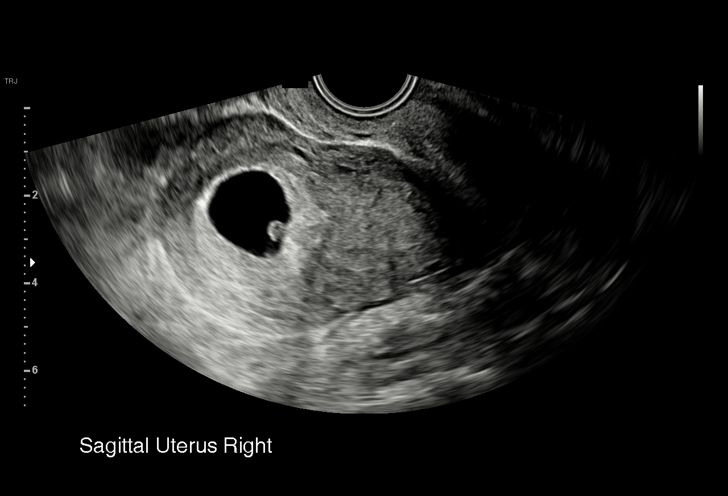
[im 6/39]
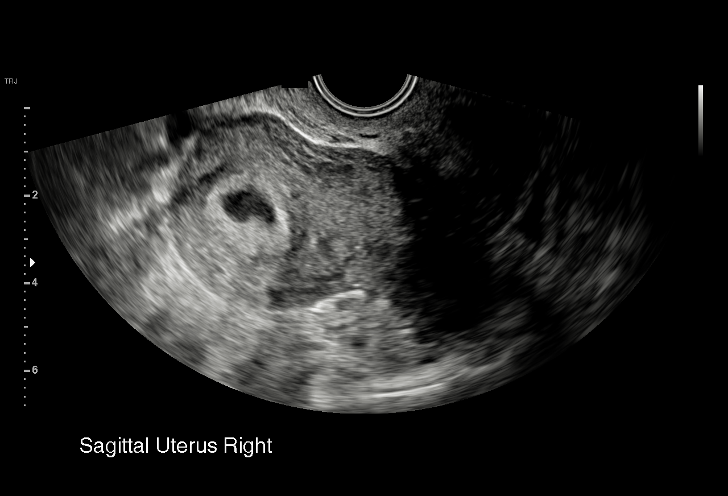
[im 9/39]
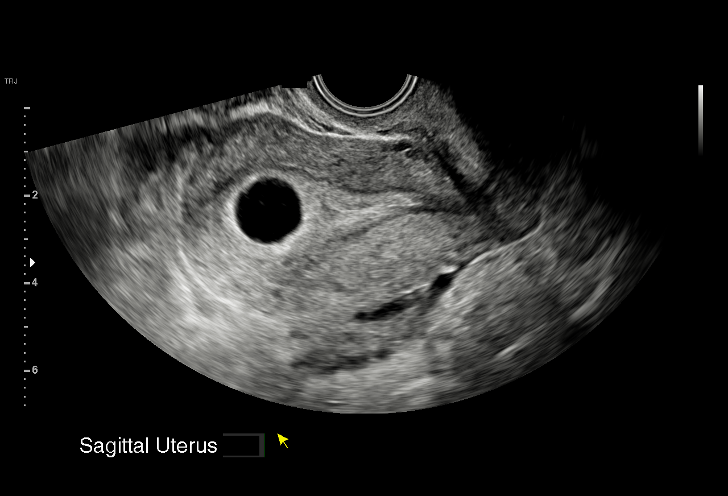
[im 12/39]
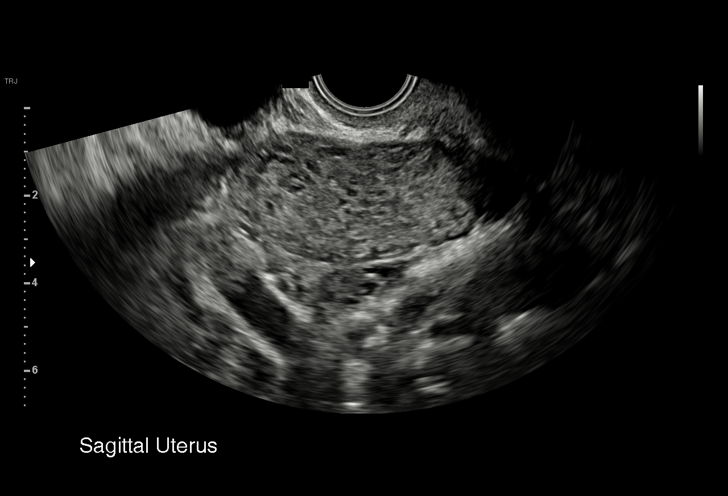
[im 15/39]
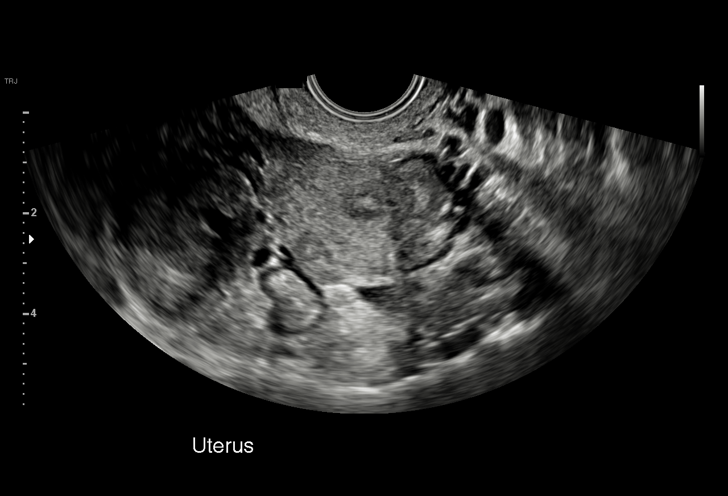
[im 17/39]
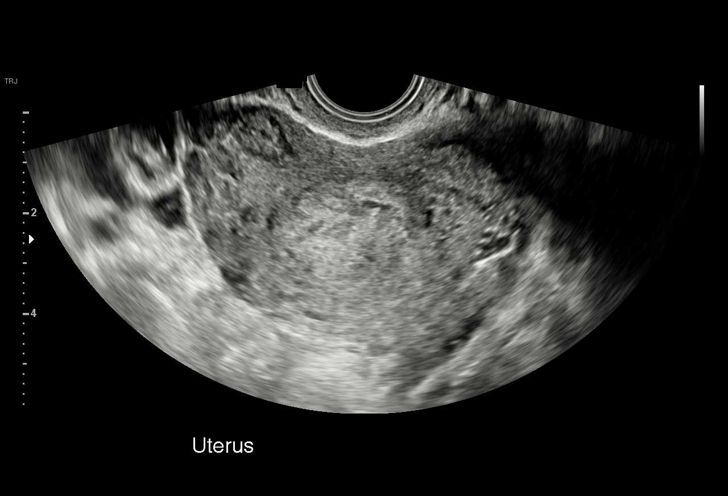
[im 20/39]
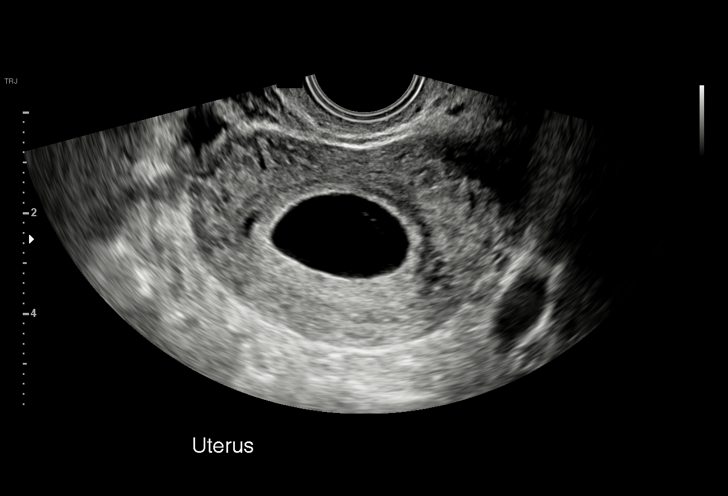
[im 22/39]
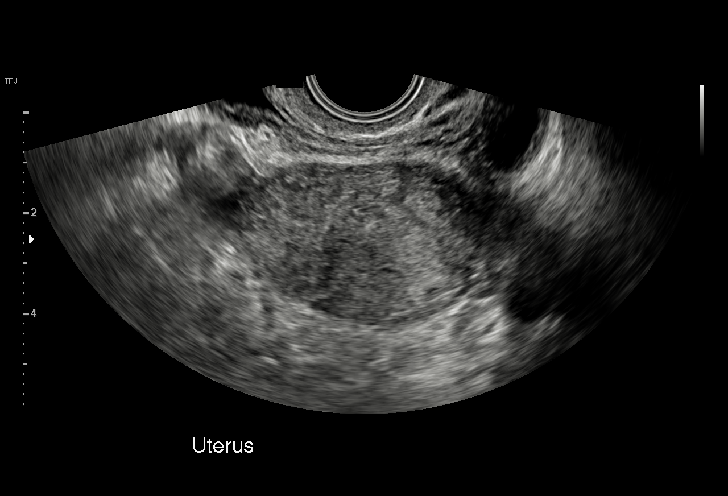
[im 24/39]
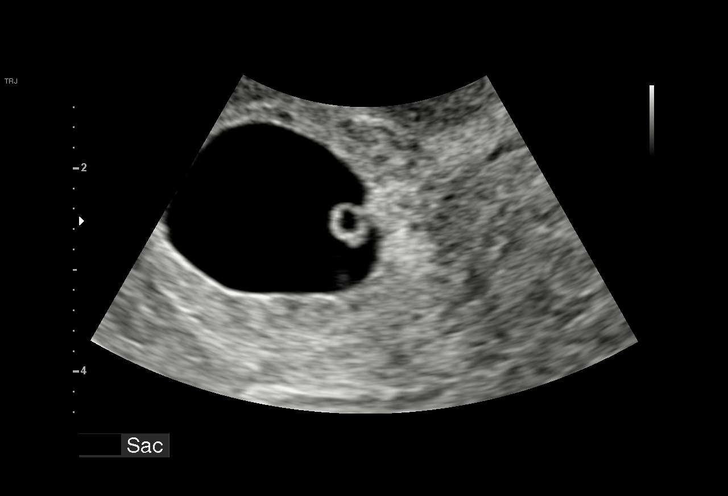
[im 27/39]
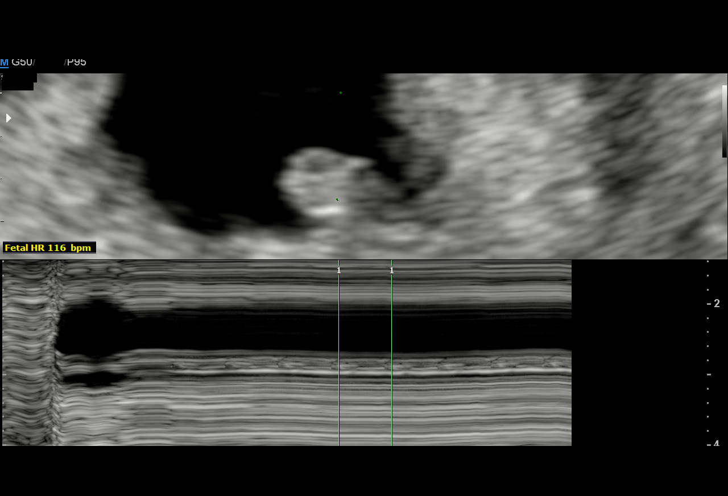
[im 30/39]
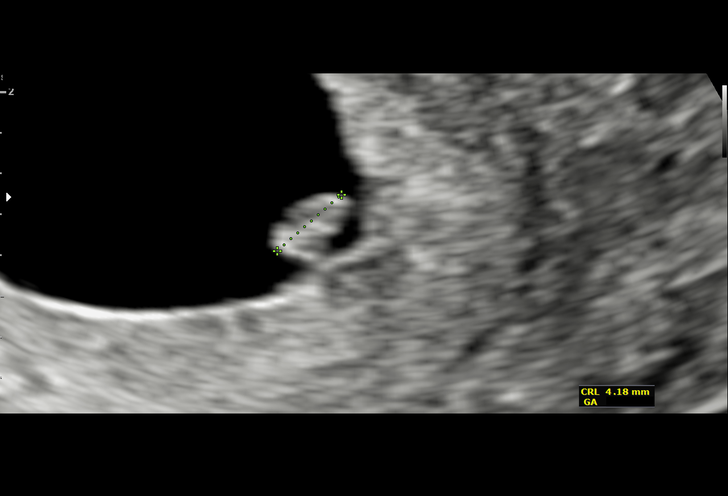
[im 33/39]
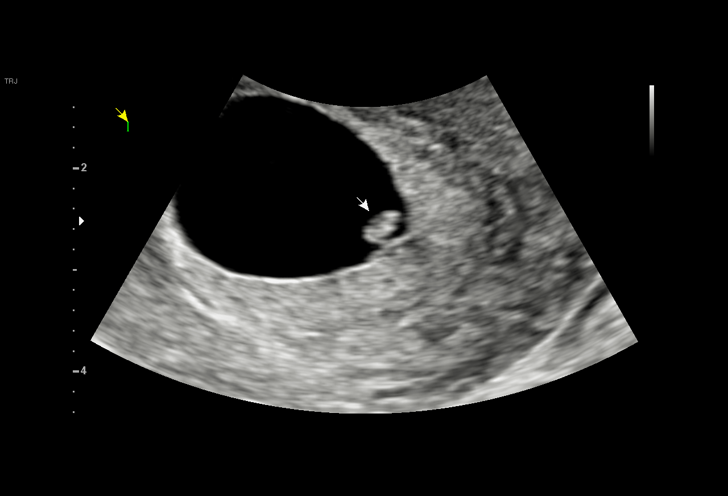
[im 36/39]
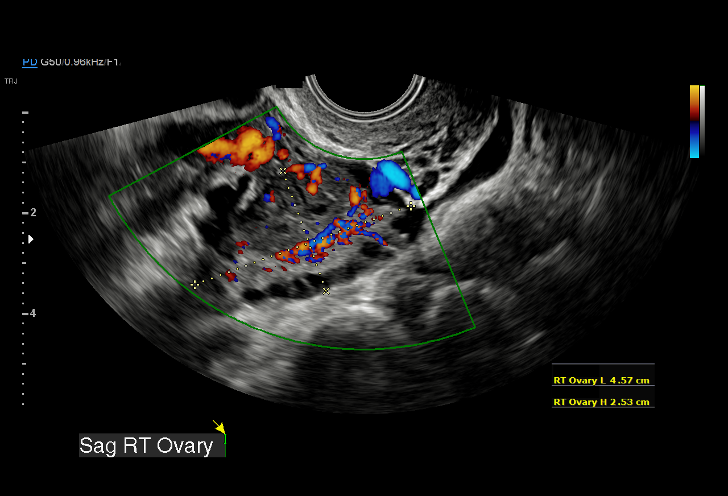
[im 39/39]
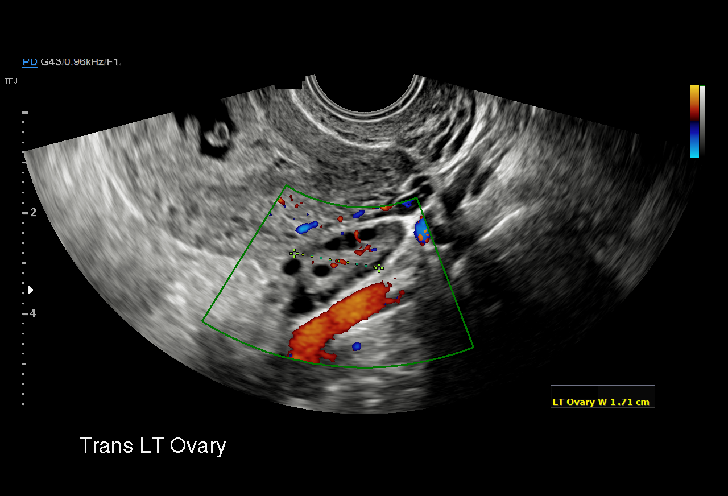

[15 of 28 positions shown; findings below may reference images not displayed]

FINDINGS: Intrauterine gestational sac: Single

Yolk sac:  Visualized.

Embryo:  Visualized.

Cardiac Activity: Visualized.

Heart Rate: 115 bpm

CRL:   4.17 mm   6 w 1 d                  US EDC: December 13, 2019

Subchorionic hemorrhage:  None visualized.

Maternal uterus/adnexae: Corpus luteum cyst seen in right ovary.
Left ovary is unremarkable. Trace free fluid is noted which most
likely physiologic.
IMPRESSION: Single live intrauterine gestation of 6 weeks 1 day.

## 2021-05-08 IMAGING — US US OB COMP LESS 14 WK
1 series · 15 of 28 positions shown · non-contrast
Comparison: None.

CLINICAL DATA: Vaginal spotting

EXAM:
OBSTETRIC <14 WK ULTRASOUND
TECHNIQUE: Transabdominal ultrasound was performed for evaluation of the
gestation as well as the maternal uterus and adnexal regions.

[Series 1: us ob comp less 14 wk · 15 of 38 slices shown]
[im 1/38]
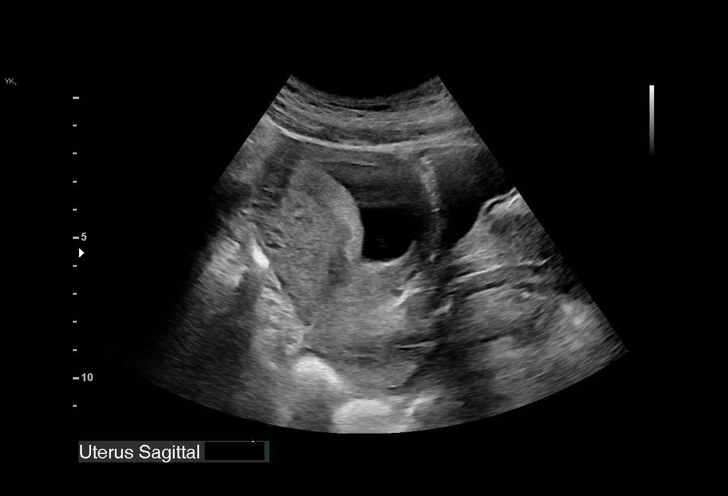
[im 3/38]
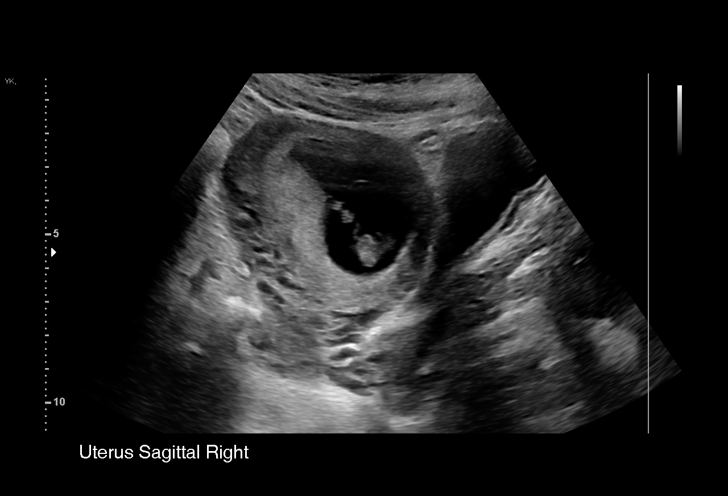
[im 6/38]
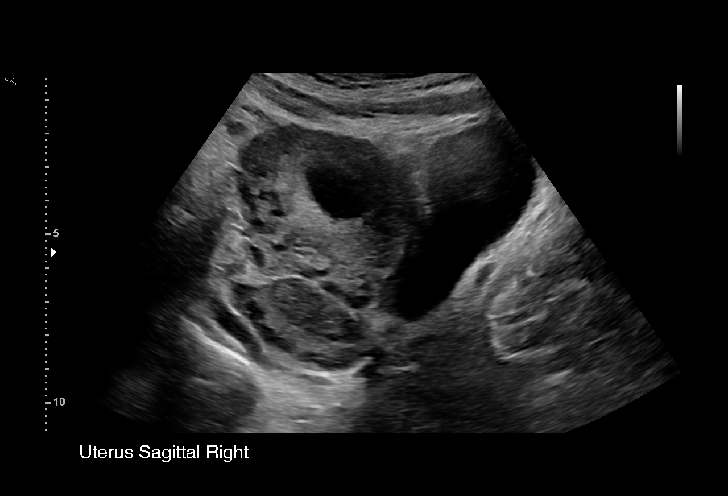
[im 9/38]
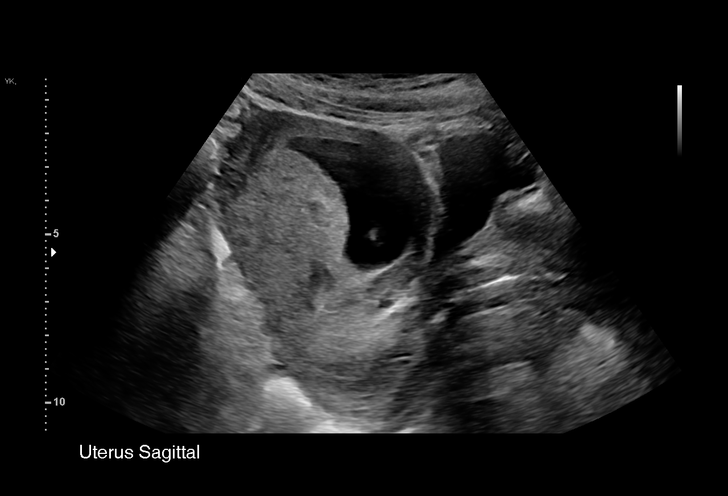
[im 11/38]
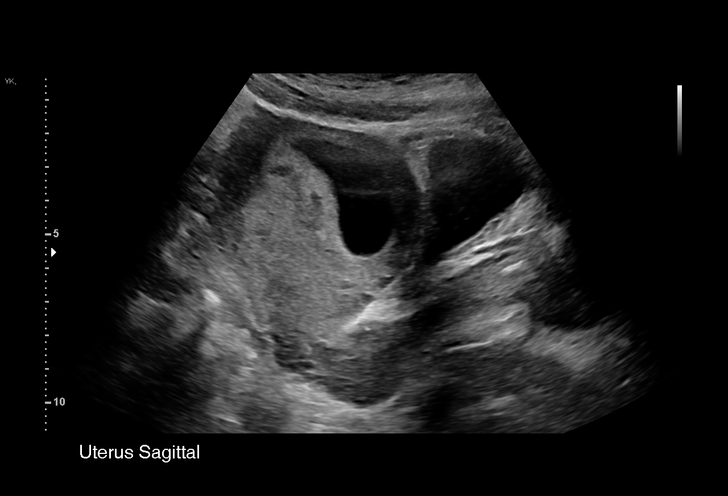
[im 14/38]
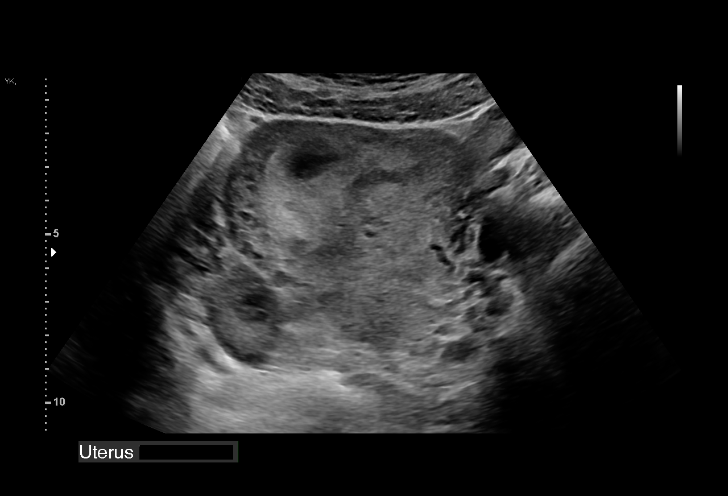
[im 17/38]
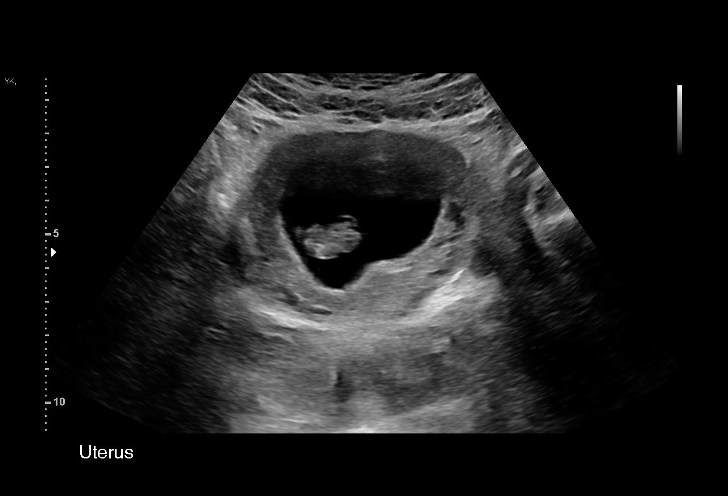
[im 20/38]
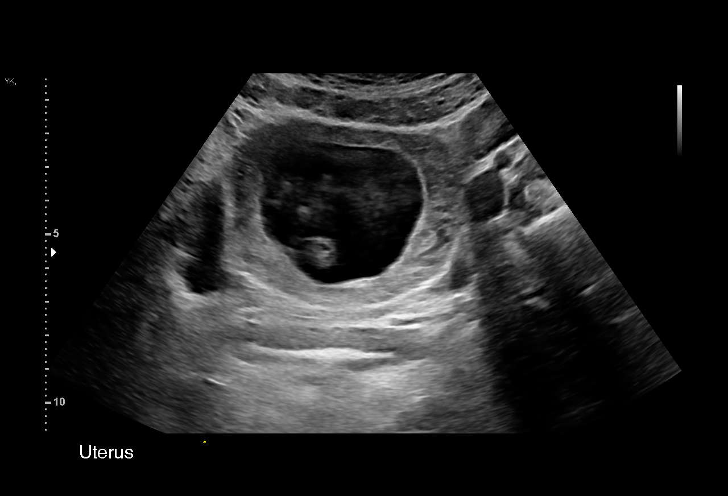
[im 21/38]
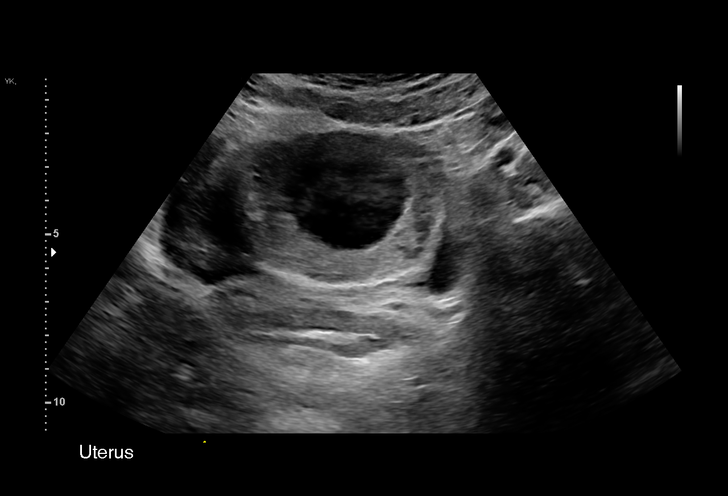
[im 24/38]
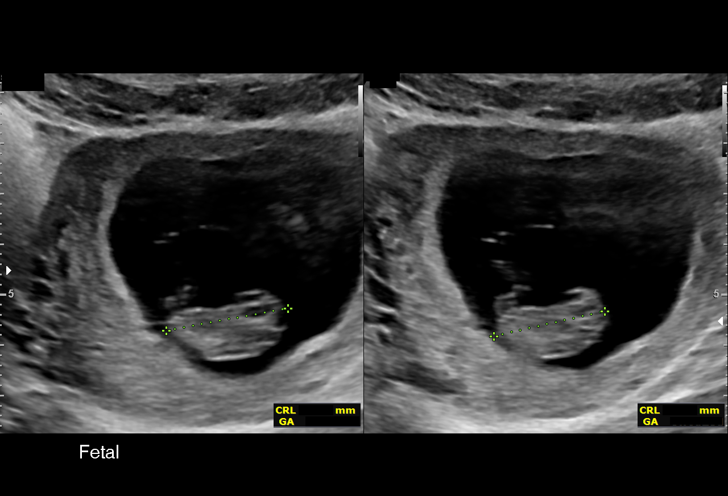
[im 27/38]
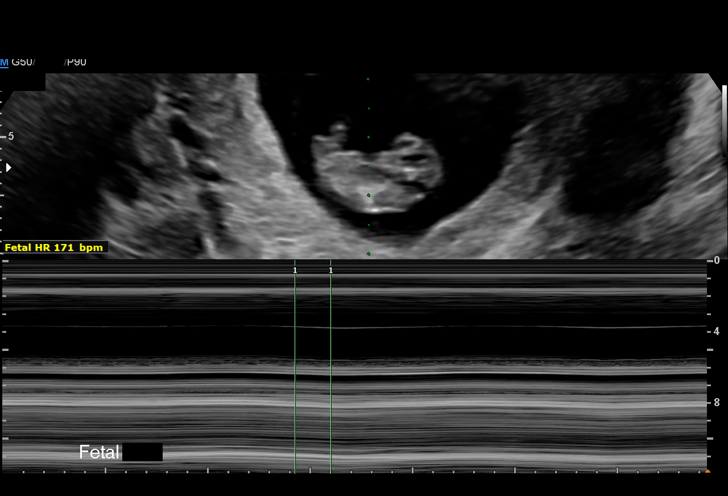
[im 29/38]
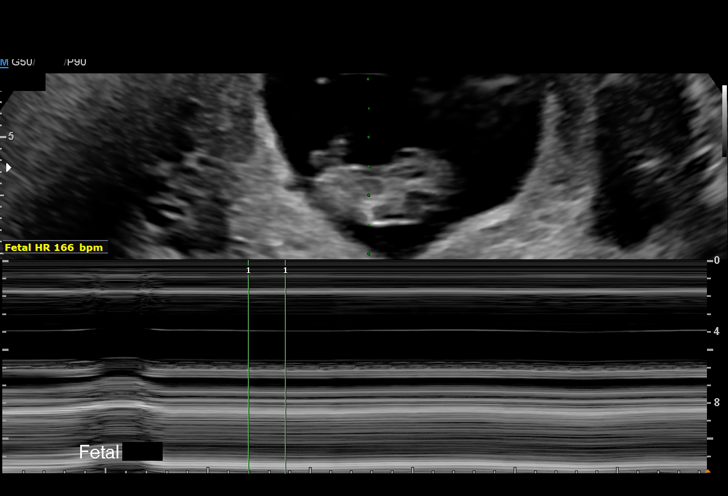
[im 32/38]
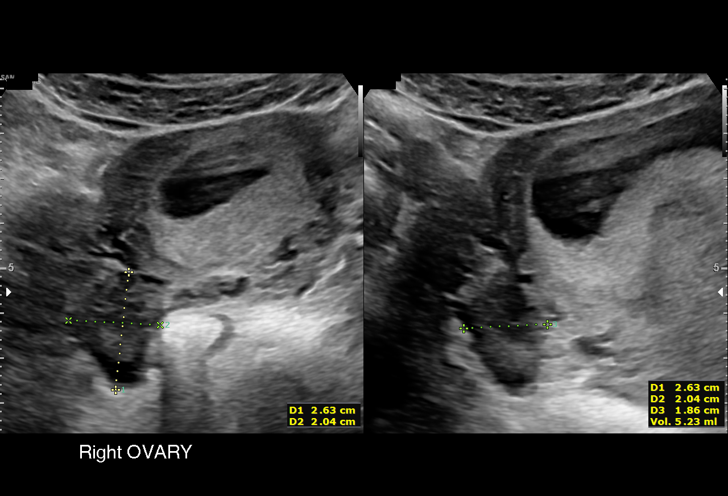
[im 35/38]
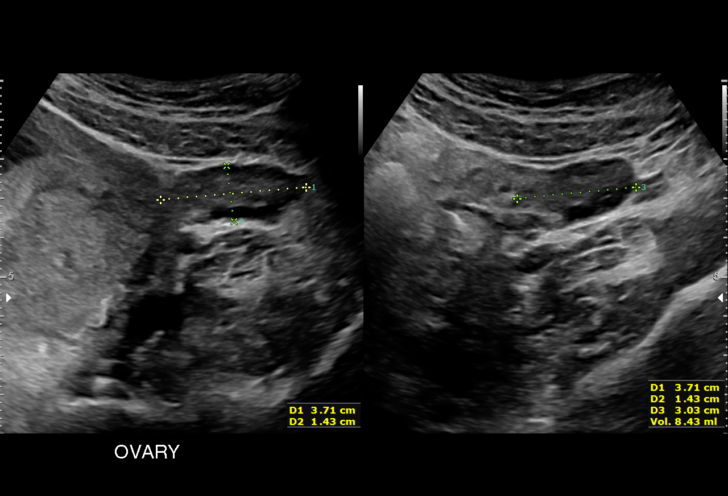
[im 38/38]
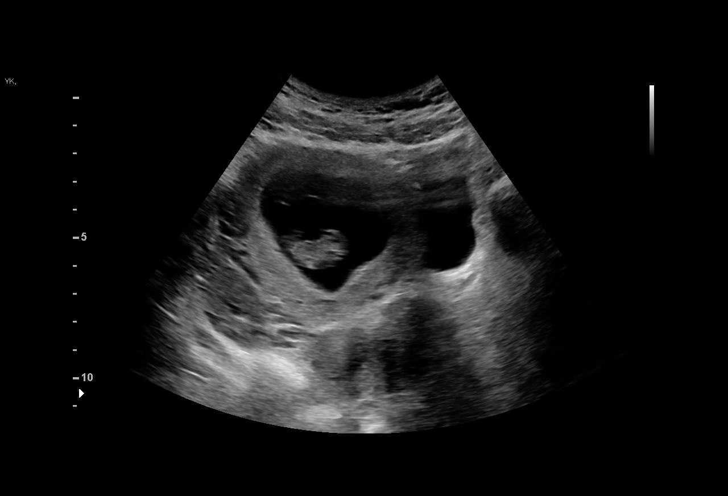

[15 of 28 positions shown; findings below may reference images not displayed]

FINDINGS: Intrauterine gestational sac: Single

Yolk sac:  Visualized.

Embryo:  Visualized.

Cardiac Activity: Visualized.

Heart Rate: 168 bpm

CRL:   23.35 mm   9 w 0 d                  US EDC: 12/09/2019

Subchorionic hemorrhage:  None visualized.

Maternal uterus/adnexae: The ovaries appear normal. The right ovary
measures 2.6 x 2.0 x 2.07 cm. The left ovary measures 3.7 x 3.0 x
1.4 cm.
IMPRESSION: Single live intrauterine pregnancy measuring 9 weeks 0 days.

## 2021-05-19 ENCOUNTER — Ambulatory Visit (INDEPENDENT_AMBULATORY_CARE_PROVIDER_SITE_OTHER): Payer: Medicaid Other | Admitting: Family Medicine

## 2021-05-19 ENCOUNTER — Other Ambulatory Visit: Payer: Self-pay

## 2021-05-19 VITALS — BP 126/54 | HR 79 | Wt 139.1 lb

## 2021-05-19 DIAGNOSIS — D573 Sickle-cell trait: Secondary | ICD-10-CM

## 2021-05-19 DIAGNOSIS — O34219 Maternal care for unspecified type scar from previous cesarean delivery: Secondary | ICD-10-CM

## 2021-05-19 DIAGNOSIS — Z3480 Encounter for supervision of other normal pregnancy, unspecified trimester: Secondary | ICD-10-CM

## 2021-05-19 NOTE — Patient Instructions (Signed)
Preparing for Vaginal Birth After Cesarean Delivery Vaginal birth after cesarean delivery (VBAC) means giving birth vaginally after previously delivering a baby through a cesarean section (C-section). You and your health care provider will discuss your options and whether you may be a good candidate for VBAC. Types of options After a cesarean birth, your options for future deliveries may include: A scheduled cesarean birth. This is done in a hospital with an operating room. A trial of labor after cesarean. A successful trial of labor results in a vaginal delivery. If it is not successful, you will need to have a cesarean birth. A trial of labor after a cesarean should be attempted in facilities where an emergency cesarean birth can be performed. What are the benefits? The benefits of delivering your baby vaginally instead of by a cesarean birth include: A shorter hospital stay. A faster recovery time. Less pain. Avoiding risks associated with major surgery, such as infection and blood clots. Less blood loss and less need for donated blood (transfusions). What are the risks? The main risk of attempting a VBAC is that it may fail. This would force your health care provider to deliver your baby by a C-section. Other risks are rare and may include: Tearing (rupture) of the scar from a past cesarean birth. Other risks associated with vaginal deliveries. If a repeat cesarean birth is needed, the risks include: Blood loss. Infection. Blood clot. Damage to surrounding organs. Removal of the uterus (hysterectomy), if it is damaged. Placenta problems in future pregnancies. What should I know about my past cesarean delivery? It is important to know what type of incision was made in your uterus in a past cesarean birth, such as vertical or transverse. The type of incision can affect the success of your trial of labor. Who are the best candidates for VBAC? The best candidates for VBAC are women  who: Have had one or two prior cesarean births, and their incision makes it safe to attempt a vaginal delivery. Do not have a scar on their uterus. A scar would make it unsafe to attempt a vaginal delivery. Have not had a tear in the wall of their uterus (uterine rupture). Plan to have more pregnancies. A VBAC is also more likely to be successful: In women who have previously given birth vaginally. When labor starts by itself (spontaneously) before the due date. When is VBAC not an option? As your pregnancy progresses, circumstances may change and you may need to reconsider your options. Your situation may also change as you begin a trial of labor. Your health care provider may not want you to attempt a VBAC if: You have had more than two cesarean births. You are past your due date. Your baby's suspected weight is 8.8 lb (4 kg) or more. The scar on your uterus from a previous cesarean birth makes it unsafe to deliver vaginally. You have a history of a uterine rupture. You have preeclampsia. This is a condition that causes high blood pressure along with other symptoms, such as swelling and headaches. What else should I know about my options? Delivering a baby through a VBAC is similar to having a normal spontaneous vaginal delivery. Therefore, it is safe: To try with twins. For your health care provider to try to turn the baby from a breech position (external cephalic version) during labor. With epidural analgesia for pain relief. Consider where you would like to deliver your baby. VBAC should be attempted in facilities where an emergency cesarean birth can be  performed. VBAC is not recommended for home births. Any changes in your health or your baby's health during your pregnancy may make it necessary to change your initial decision about VBAC. Questions to ask your health care provider Am I a good candidate for the trial of labor? What are my chances of a successful vaginal delivery? Is my  preferred birth location equipped for a trial of labor? What are my pain management options during a trial of labor? Where to find more information [employer]: www.acog.org [employer]Mid[employer]: www.midwife.org Summary Vaginal birth after cesarean delivery (VBAC) is giving birth vaginally after previously having a baby through a cesarean section (C-section). A trial of labor should be attempted in facilities where emergency cesarean section procedures can be performed. Your health care provider may recommend a repeat cesarean birth if a vaginal delivery is not safe for you. This information is not intended to replace advice given to you by your health care provider. Make sure you discuss any questions you have with your health care provider. Document Revised: 06/13/2020 Document Reviewed: 06/13/2020 Elsevier Patient Education  2022 [employer].

## 2021-05-19 NOTE — Progress Notes (Signed)
   PRENATAL VISIT NOTE  Subjective:  Shari Davidson is a 30 y.o. [phone] at [redacted]w[redacted]d being seen today for ongoing prenatal care.  She is currently monitored for the following issues for this low-risk pregnancy and has Migraine headache; MYOPIA; Asthma; Depression, major, recurrent, moderate (HCC); Supervision of other normal pregnancy, antepartum; Sickle cell trait (HCC); Previous cesarean section complicating pregnancy, antepartum condition or complication; and Chronic constipation on their problem list.  Patient reports no complaints.  Contractions: Not present. Vag. Bleeding: None.  Movement: Present. Denies leaking of fluid.   The following portions of the patient's history were reviewed and updated as appropriate: allergies, current medications, past family history, past medical history, past social history, past surgical history and problem list.   Objective:   Vitals:   05/19/21 1058  BP: (!) 126/54  Pulse: 79  Weight: 139 lb 1.6 oz (63.1 kg)    Fetal Status: Fetal Heart Rate (bpm): 154 Fundal Height: 23 cm Movement: Present     General:  Alert, oriented and cooperative. Patient is in no acute distress.  Skin: Skin is warm and dry. No rash noted.   Cardiovascular: Normal heart rate noted  Respiratory: Normal respiratory effort, no problems with respiration noted  Abdomen: Soft, gravid, appropriate for gestational age.  Pain/Pressure: Absent     Pelvic: Cervical exam deferred        Extremities: Normal range of motion.  Edema: None  Mental Status: Normal mood and affect. Normal behavior. Normal judgment and thought content.   Assessment and Plan:  Pregnancy: G4P1021 at 107w4d 1. Supervision of other normal pregnancy, antepartum Continue routine prenatal care.  2. Sickle cell trait (HCC)   3. Previous cesarean section complicating pregnancy, antepartum condition or complication Considering TOLAC Written information given  Preterm labor symptoms and general  obstetric precautions including but not limited to vaginal bleeding, contractions, leaking of fluid and fetal movement were reviewed in detail with the patient. Please refer to After Visit Summary for other counseling recommendations.   Return in 4 weeks (on 06/16/2021) for [facility], in person, 28 wk labs.  Future Appointments  Date Time Provider Department Center  06/16/2021  8:35 AM Allayne Stack, DO [facility]  06/16/2021  9:30 AM [facility]    Reva Bores, MD

## 2021-06-05 NOTE — Progress Notes (Deleted)
    SUBJECTIVE:   CHIEF COMPLAINT / HPI:   "Knots in knees" 30 year old G4, P1 at 33 weeks presenting with the above.  She states***.  PERTINENT  PMH / PSH: 26w gestation  OBJECTIVE:   LMP 12/09/2020  ***  General: NAD, pleasant, able to participate in exam Respiratory: No respiratory distress MSK: *** Neuro: alert, no obvious focal deficits Psych: Normal affect and mood  ASSESSMENT/PLAN:   No problem-specific Assessment & Plan notes found for this encounter.     Jackelyn Poling, DO [facility]    {    This will disappear when note is signed, click to select method of visit    :1}

## 2021-06-06 ENCOUNTER — Ambulatory Visit: Payer: Medicaid Other

## 2021-06-11 ENCOUNTER — Other Ambulatory Visit: Payer: Self-pay | Admitting: *Deleted

## 2021-06-11 DIAGNOSIS — Z3480 Encounter for supervision of other normal pregnancy, unspecified trimester: Secondary | ICD-10-CM

## 2021-06-16 ENCOUNTER — Encounter: Payer: Self-pay | Admitting: Family Medicine

## 2021-06-16 ENCOUNTER — Other Ambulatory Visit: Payer: Self-pay

## 2021-06-16 ENCOUNTER — Other Ambulatory Visit (HOSPITAL_COMMUNITY)
Admission: RE | Admit: 2021-06-16 | Discharge: 2021-06-16 | Disposition: A | Discharge status: Home / Self Care | Payer: Medicaid Other | Source: Ambulatory Visit | Attending: Family Medicine | Admitting: Family Medicine

## 2021-06-16 ENCOUNTER — Ambulatory Visit (INDEPENDENT_AMBULATORY_CARE_PROVIDER_SITE_OTHER): Payer: Medicaid Other | Admitting: Family Medicine

## 2021-06-16 ENCOUNTER — Other Ambulatory Visit: Payer: Medicaid Other

## 2021-06-16 VITALS — BP 114/68 | HR 90 | Wt 137.8 lb

## 2021-06-16 DIAGNOSIS — N898 Other specified noninflammatory disorders of vagina: Secondary | ICD-10-CM | POA: Insufficient documentation

## 2021-06-16 DIAGNOSIS — J45909 Unspecified asthma, uncomplicated: Secondary | ICD-10-CM

## 2021-06-16 DIAGNOSIS — Z3480 Encounter for supervision of other normal pregnancy, unspecified trimester: Secondary | ICD-10-CM

## 2021-06-16 DIAGNOSIS — D573 Sickle-cell trait: Secondary | ICD-10-CM

## 2021-06-16 DIAGNOSIS — Z3400 Encounter for supervision of normal first pregnancy, unspecified trimester: Secondary | ICD-10-CM

## 2021-06-16 DIAGNOSIS — Z3A27 27 weeks gestation of pregnancy: Secondary | ICD-10-CM

## 2021-06-16 DIAGNOSIS — Z23 Encounter for immunization: Secondary | ICD-10-CM | POA: Diagnosis not present

## 2021-06-16 DIAGNOSIS — O34219 Maternal care for unspecified type scar from previous cesarean delivery: Secondary | ICD-10-CM

## 2021-06-16 MED ORDER — FAMOTIDINE 20 MG PO TABS: 20.0000 mg | ORAL_TABLET | Freq: Every day | ORAL | 3 refills | Status: DC

## 2021-06-16 MED ORDER — ALBUTEROL SULFATE HFA 108 (90 BASE) MCG/ACT IN AERS: 2.0000 | INHALATION_SPRAY | RESPIRATORY_TRACT | 1 refills | Status: AC | PRN

## 2021-06-16 NOTE — Progress Notes (Signed)
° ° °  Subjective:  Shari Davidson is a 30 y.o. [phone] at [redacted]w[redacted]d being seen today for ongoing prenatal care.  She is currently monitored for the following issues for this low-risk pregnancy and has Asthma; Depression, major, recurrent, moderate ([place]); Supervision of other normal pregnancy, antepartum; Sickle cell trait ([place]); Previous cesarean section complicating pregnancy, antepartum condition or complication; and Chronic constipation on their problem list.  Patient reports  vaginal discharge that is yellow is color but no itching/irritation .  Contractions: Not present. Vag. Bleeding: None.  Movement: Present. Denies leaking of fluid.   The following portions of the patient's history were reviewed and updated as appropriate: allergies, current medications, past family history, past medical history, past social history, past surgical history and problem list.   Objective:   Vitals:   06/16/21 0832  BP: 114/68  Pulse: 90  Weight: 137 lb 12.8 oz (62.5 kg)    Fetal Status: Fetal Heart Rate (bpm): 152 Fundal Height: 27 cm Movement: Present     General:  Alert, oriented and cooperative. Patient is in no acute distress.  Skin: Skin is warm and dry. No rash noted.   Cardiovascular: Normal heart rate noted  Respiratory: Normal respiratory effort, no problems with respiration noted  Abdomen: Soft, gravid, appropriate for gestational age. Pain/Pressure: Absent     Pelvic:  Cervical exam deferred        Extremities: Normal range of motion.  Edema: Trace  Mental Status: Normal mood and affect. Normal behavior. Normal judgment and thought content.    Assessment and Plan:  Pregnancy: G4P1021 at [redacted]w[redacted]d  1. Supervision of other normal pregnancy, antepartum Doing well with normal fetal movement.   2. [redacted] weeks gestation of pregnancy Third trimester labs collected today. Tdap given.  3. Previous cesarean section complicating pregnancy, antepartum condition or complication Primary C/S in  2021 for fetal intolerance of labor. Recently discussed risks vs benefits with Dr. Damita Dunnings and Dr. Cy Blamer. She would like to proceed with VBAC. Re-discussed risks and benefits, consent signed.  4. Sickle cell trait ([place]) Recommended FOB testing, she is going to talk with FOB about kit.   5. Vaginal discharge No symptoms, just more yellow than previous. Self swabbed.   6. Mild intermittent asthma Refilled albuterol inhaler. No current symptoms.   Preterm labor symptoms and general obstetric precautions including but not limited to vaginal bleeding, contractions, leaking of fluid and fetal movement were reviewed in detail with the patient. Please refer to After Visit Summary for other counseling recommendations.   Return in about 2 weeks (around 06/30/2021) for [place].   Patriciaann Clan, DO

## 2021-06-17 LAB — CERVICOVAGINAL ANCILLARY ONLY
Bacterial Vaginitis (gardnerella): NEGATIVE
Candida Glabrata: NEGATIVE
Candida Vaginitis: NEGATIVE
Chlamydia: NEGATIVE
Comment: NEGATIVE
Comment: NEGATIVE
Comment: NEGATIVE
Comment: NEGATIVE
Comment: NEGATIVE
Comment: NORMAL
Neisseria Gonorrhea: NEGATIVE
Trichomonas: NEGATIVE

## 2021-06-17 LAB — CBC
Hematocrit: 30.3 % — ABNORMAL LOW (ref 34.0–46.6)
Hemoglobin: 10.4 g/dL — ABNORMAL LOW (ref 11.1–15.9)
MCH: 32 pg (ref 26.6–33.0)
MCHC: 34.3 g/dL (ref 31.5–35.7)
MCV: 93 fL (ref 79–97)
Platelets: 253 10*3/uL (ref 150–450)
RBC: 3.25 x10E6/uL — ABNORMAL LOW (ref 3.77–5.28)
RDW: 11 % — ABNORMAL LOW (ref 11.7–15.4)
WBC: 5.9 10*3/uL (ref 3.4–10.8)

## 2021-06-17 LAB — HIV ANTIBODY (ROUTINE TESTING W REFLEX): HIV Screen 4th Generation wRfx: NONREACTIVE

## 2021-06-17 LAB — GLUCOSE TOLERANCE, 2 HOURS W/ 1HR
Glucose, 1 hour: 150 mg/dL (ref 70–179)
Glucose, 2 hour: 142 mg/dL (ref 70–152)
Glucose, Fasting: 74 mg/dL (ref 70–91)

## 2021-06-17 LAB — RPR: RPR Ser Ql: NONREACTIVE

## 2021-06-27 ENCOUNTER — Other Ambulatory Visit: Payer: Self-pay | Admitting: Family Medicine

## 2021-06-27 DIAGNOSIS — D509 Iron deficiency anemia, unspecified: Secondary | ICD-10-CM

## 2021-06-27 MED ORDER — FERROUS SULFATE 325 (65 FE) MG PO TABS: 325.0000 mg | ORAL_TABLET | ORAL | 1 refills | Status: DC

## 2021-07-07 ENCOUNTER — Ambulatory Visit (INDEPENDENT_AMBULATORY_CARE_PROVIDER_SITE_OTHER): Payer: Medicaid Other | Admitting: Family Medicine

## 2021-07-07 ENCOUNTER — Encounter: Payer: Self-pay | Admitting: Family Medicine

## 2021-07-07 ENCOUNTER — Other Ambulatory Visit: Payer: Self-pay

## 2021-07-07 VITALS — BP 107/58 | HR 78 | Wt 144.0 lb

## 2021-07-07 DIAGNOSIS — Z3480 Encounter for supervision of other normal pregnancy, unspecified trimester: Secondary | ICD-10-CM

## 2021-07-07 DIAGNOSIS — Z3A30 30 weeks gestation of pregnancy: Secondary | ICD-10-CM

## 2021-07-07 DIAGNOSIS — O34219 Maternal care for unspecified type scar from previous cesarean delivery: Secondary | ICD-10-CM

## 2021-07-07 DIAGNOSIS — D573 Sickle-cell trait: Secondary | ICD-10-CM

## 2021-07-07 NOTE — Progress Notes (Signed)
° ° °  Subjective:  Shari Davidson is a 31 y.o. [phone] at [redacted]w[redacted]d being seen today for ongoing prenatal care.  She is currently monitored for the following issues for this low-risk pregnancy and has Asthma; Depression, major, recurrent, moderate ([place]); Supervision of other normal pregnancy, antepartum; Sickle cell trait ([place]); Previous cesarean section complicating pregnancy, antepartum condition or complication; and Chronic constipation on their problem list.  Patient reports no complaints. Had some cramping this weekend that has resolved. Not drinking much water throughout the day.  Contractions: Irritability. Vag. Bleeding: None.  Movement: Present. Denies leaking of fluid.   The following portions of the patient's history were reviewed and updated as appropriate: allergies, current medications, past family history, past medical history, past social history, past surgical history and problem list.   Objective:   Vitals:   07/07/21 1149  BP: (!) 107/58  Pulse: 78  Weight: 144 lb (65.3 kg)    Fetal Status: Fetal Heart Rate (bpm): 143 Fundal Height: 30 cm Movement: Present     General:  Alert, oriented and cooperative. Patient is in no acute distress.  Skin: Skin is warm and dry. No rash noted.   Cardiovascular: Normal heart rate noted  Respiratory: Normal respiratory effort, no problems with respiration noted  Abdomen: Soft, gravid, appropriate for gestational age. Pain/Pressure: Present     Pelvic:  Cervical exam deferred        Extremities: Normal range of motion.  Edema: None  Mental Status: Normal mood and affect. Normal behavior. Normal judgment and thought content.   Urinalysis:      Assessment and Plan:  Pregnancy: G4P1021 at [redacted]w[redacted]d  1. Supervision of other normal pregnancy, antepartum Doing well with normal fetal movement.   2. [redacted] weeks gestation of pregnancy  3. Sickle cell trait ([place]) Reports FOB declined testing.   4. Previous cesarean section complicating  pregnancy, antepartum condition or complication Planning for TOLAC.   Preterm labor symptoms and general obstetric precautions including but not limited to vaginal bleeding, contractions, leaking of fluid and fetal movement were reviewed in detail with the patient. Please refer to After Visit Summary for other counseling recommendations.  Return in about 2 weeks (around 07/21/2021) for [place].   Patriciaann Clan, DO

## 2021-07-22 ENCOUNTER — Encounter: Payer: Medicaid Other | Admitting: Family Medicine

## 2021-07-22 DIAGNOSIS — O34219 Maternal care for unspecified type scar from previous cesarean delivery: Secondary | ICD-10-CM

## 2021-07-22 DIAGNOSIS — Z3480 Encounter for supervision of other normal pregnancy, unspecified trimester: Secondary | ICD-10-CM

## 2021-07-22 DIAGNOSIS — Z3A32 32 weeks gestation of pregnancy: Secondary | ICD-10-CM

## 2021-07-24 ENCOUNTER — Ambulatory Visit (INDEPENDENT_AMBULATORY_CARE_PROVIDER_SITE_OTHER): Payer: Medicaid Other | Admitting: Family Medicine

## 2021-07-24 ENCOUNTER — Other Ambulatory Visit: Payer: Self-pay

## 2021-07-24 VITALS — BP 122/78 | HR 106 | Wt 146.3 lb

## 2021-07-24 DIAGNOSIS — D573 Sickle-cell trait: Secondary | ICD-10-CM

## 2021-07-24 DIAGNOSIS — O34219 Maternal care for unspecified type scar from previous cesarean delivery: Secondary | ICD-10-CM

## 2021-07-24 DIAGNOSIS — Z3480 Encounter for supervision of other normal pregnancy, unspecified trimester: Secondary | ICD-10-CM

## 2021-07-24 NOTE — Patient Instructions (Signed)

## 2021-07-24 NOTE — Progress Notes (Signed)
° °  PRENATAL VISIT NOTE  Subjective:  Shari Davidson is a 31 y.o. [phone] at [redacted]w[redacted]d being seen today for ongoing prenatal care.  She is currently monitored for the following issues for this high-risk pregnancy and has Asthma; Depression, major, recurrent, moderate ([place]); Supervision of other normal pregnancy, antepartum; Sickle cell trait ([place]); Previous cesarean section complicating pregnancy, antepartum condition or complication; and Chronic constipation on their problem list.  Patient reports no complaints.  Contractions: Irritability. Vag. Bleeding: None.  Movement: Present. Denies leaking of fluid.   The following portions of the patient's history were reviewed and updated as appropriate: allergies, current medications, past family history, past medical history, past social history, past surgical history and problem list.   Objective:   Vitals:   07/24/21 1118  BP: 122/78  Pulse: (!) 106  Weight: 146 lb 4.8 oz (66.4 kg)    Fetal Status: Fetal Heart Rate (bpm): 156 Fundal Height: 31 cm Movement: Present  Presentation: Vertex  General:  Alert, oriented and cooperative. Patient is in no acute distress.  Skin: Skin is warm and dry. No rash noted.   Cardiovascular: Normal heart rate noted  Respiratory: Normal respiratory effort, no problems with respiration noted  Abdomen: Soft, gravid, appropriate for gestational age.  Pain/Pressure: Present     Pelvic: Cervical exam deferred        Extremities: Normal range of motion.  Edema: None  Mental Status: Normal mood and affect. Normal behavior. Normal judgment and thought content.   Assessment and Plan:  Pregnancy: G4P1021 at [redacted]w[redacted]d 1. Supervision of other normal pregnancy, antepartum Continue routine prenatal care.  2. Previous cesarean section complicating pregnancy, antepartum condition or complication For TOLAC - consent signed  3. Sickle cell trait (HCC) 3rd trimester Urine cx. - Culture, OB Urine  Preterm labor  symptoms and general obstetric precautions including but not limited to vaginal bleeding, contractions, leaking of fluid and fetal movement were reviewed in detail with the patient. Please refer to After Visit Summary for other counseling recommendations.   Return in 2 weeks (on 08/07/2021).  Future Appointments  Date Time Provider [place]  07/25/2021  1:30 PM ACCESS TO CARE POOL [facility]  08/07/2021 10:55 AM Ardean Larsen, Mervyn Skeeters, CNM [facility]    Donnamae Jude, MD

## 2021-07-25 ENCOUNTER — Ambulatory Visit: Payer: Medicaid Other

## 2021-07-25 NOTE — Patient Instructions (Incomplete)
It was nice seeing you today!  Blood work today.  See me in 3 months or whenever is a good for you.  Stay well, Jennings Corado, MD [facility] [phone]  --  Make sure to check out at the front desk before you leave today.  Please arrive at least 15 minutes prior to your scheduled appointments.  If you had blood work today, I will send you a MyChart message or a letter if results are normal. Otherwise, I will give you a call.  If you had a referral placed, they will call you to set up an appointment. Please give us a call if you don't hear back in the next 2 weeks.  If you need additional refills before your next appointment, please call your pharmacy first.  

## 2021-07-25 NOTE — Progress Notes (Deleted)
° ° °  SUBJECTIVE:   CHIEF COMPLAINT / HPI:  No chief complaint on file.   ***  PERTINENT  PMH / PSH: depression, sickle cell trait, asthma  Patient Care Team: Lattie Haw, MD as PCP - General (Family Medicine)   OBJECTIVE:   LMP 12/09/2020   Physical Exam   Depression screen PHQ 2/9 07/07/2021  Decreased Interest 0  Down, Depressed, Hopeless 0  PHQ - 2 Score 0  Altered sleeping 0  Tired, decreased energy 0  Change in appetite 0  Feeling bad or failure about yourself  0  Trouble concentrating 0  Moving slowly or fidgety/restless 0  Suicidal thoughts 0  PHQ-9 Score 0  Difficult doing work/chores -  Some recent data might be hidden     {Show previous vital signs (optional):23777}  {Labs   Heme   Chem   Endocrine   Serology   Results Review (optional):23779}  ASSESSMENT/PLAN:   No problem-specific Assessment & Plan notes found for this encounter.    No follow-ups on file.   Zola Button, MD [place]

## 2021-07-26 LAB — CULTURE, OB URINE

## 2021-07-26 LAB — URINE CULTURE, OB REFLEX

## 2021-08-07 ENCOUNTER — Encounter: Payer: Self-pay | Admitting: Obstetrics & Gynecology

## 2021-08-07 ENCOUNTER — Ambulatory Visit (INDEPENDENT_AMBULATORY_CARE_PROVIDER_SITE_OTHER): Payer: Medicaid Other | Admitting: Obstetrics & Gynecology

## 2021-08-07 ENCOUNTER — Other Ambulatory Visit: Payer: Self-pay

## 2021-08-07 VITALS — BP 131/72 | HR 79 | Wt 146.0 lb

## 2021-08-07 DIAGNOSIS — O34219 Maternal care for unspecified type scar from previous cesarean delivery: Secondary | ICD-10-CM

## 2021-08-07 DIAGNOSIS — Z3A35 35 weeks gestation of pregnancy: Secondary | ICD-10-CM

## 2021-08-07 DIAGNOSIS — Z3480 Encounter for supervision of other normal pregnancy, unspecified trimester: Secondary | ICD-10-CM

## 2021-08-07 NOTE — Progress Notes (Signed)
° °  PRENATAL VISIT NOTE  Subjective:  Shari Davidson is a 31 y.o. [phone] at [redacted]w[redacted]d being seen today for ongoing prenatal care.  She is currently monitored for the following issues for this low-risk pregnancy and has Asthma; Depression, major, recurrent, moderate (HCC); Supervision of other normal pregnancy, antepartum; Sickle cell trait (HCC); Previous cesarean section complicating pregnancy, antepartum condition or complication; and Chronic constipation on their problem list.  Patient reports no complaints.  Contractions: Irritability. Vag. Bleeding: None.  Movement: Present. Denies leaking of fluid.   The following portions of the patient's history were reviewed and updated as appropriate: allergies, current medications, past family history, past medical history, past social history, past surgical history and problem list.   Objective:   Vitals:   08/07/21 1122  BP: 131/72  Pulse: 79  Weight: 146 lb (66.2 kg)    Fetal Status: Fetal Heart Rate (bpm): 148 Fundal Height: 35 cm Movement: Present     General:  Alert, oriented and cooperative. Patient is in no acute distress.  Skin: Skin is warm and dry. No rash noted.   Cardiovascular: Normal heart rate noted  Respiratory: Normal respiratory effort, no problems with respiration noted  Abdomen: Soft, gravid, appropriate for gestational age.  Pain/Pressure: Present     Pelvic: Cervical exam deferred        Extremities: Normal range of motion.  Edema: None  Mental Status: Normal mood and affect. Normal behavior. Normal judgment and thought content.   Assessment and Plan:  Pregnancy: G4P1021 at [redacted]w[redacted]d 1. Previous cesarean section complicating pregnancy, antepartum condition or complication Desires TOLAC, consent signed 06/16/22  2. [redacted] weeks gestation of pregnancy 3. Supervision of other normal pregnancy, antepartum No other concerns.  Preterm labor symptoms and general obstetric precautions including but not limited to vaginal  bleeding, contractions, leaking of fluid and fetal movement were reviewed in detail with the patient. Please refer to After Visit Summary for other counseling recommendations.   Return in about 1 week (around 08/14/2021) for Pelvic cultures, OFFICE OB VISIT (MD or APP).  No future appointments.  Jaynie Collins, MD

## 2021-08-07 NOTE — Patient Instructions (Signed)
Return to office for any scheduled appointments. Call the office or go to the MAU at [facility] [place] if:  You begin to have strong, frequent contractions  Your water breaks.  Sometimes it is a big gush of fluid, sometimes it is just a trickle that keeps getting your panties wet or running down your legs  You have vaginal bleeding.  It is normal to have a small amount of spotting if your cervix was checked.   You do not feel your baby moving like normal.  If you do not, get something to eat and drink and lay down and focus on feeling your baby move.   If your baby is still not moving like normal, you should call the office or go to MAU.  Any other obstetric concerns.   

## 2021-08-19 ENCOUNTER — Other Ambulatory Visit: Payer: Self-pay

## 2021-08-19 ENCOUNTER — Ambulatory Visit (INDEPENDENT_AMBULATORY_CARE_PROVIDER_SITE_OTHER): Payer: Medicaid Other | Admitting: Nurse Practitioner

## 2021-08-19 ENCOUNTER — Encounter: Payer: Self-pay | Admitting: Nurse Practitioner

## 2021-08-19 ENCOUNTER — Other Ambulatory Visit (HOSPITAL_COMMUNITY)
Admission: RE | Admit: 2021-08-19 | Discharge: 2021-08-19 | Disposition: A | Discharge status: Home / Self Care | Payer: Medicaid Other | Source: Ambulatory Visit | Attending: Nurse Practitioner | Admitting: Nurse Practitioner

## 2021-08-19 VITALS — BP 123/73 | HR 77 | Wt 152.0 lb

## 2021-08-19 DIAGNOSIS — Z3480 Encounter for supervision of other normal pregnancy, unspecified trimester: Secondary | ICD-10-CM

## 2021-08-19 DIAGNOSIS — D573 Sickle-cell trait: Secondary | ICD-10-CM

## 2021-08-19 DIAGNOSIS — Z3A36 36 weeks gestation of pregnancy: Secondary | ICD-10-CM

## 2021-08-19 DIAGNOSIS — O34219 Maternal care for unspecified type scar from previous cesarean delivery: Secondary | ICD-10-CM

## 2021-08-19 NOTE — Progress Notes (Signed)
° ° °  Subjective:  Shari Davidson is a 31 y.o. [phone] at [redacted]w[redacted]d being seen today for ongoing prenatal care.  She is currently monitored for the following issues for this low-risk pregnancy and has Asthma; Depression, major, recurrent, moderate ([place]); Supervision of other normal pregnancy, antepartum; Sickle cell trait ([place]); Previous cesarean section complicating pregnancy, antepartum condition or complication; and Chronic constipation on their problem list.  Patient reports occasional contractions.  Contractions: Irritability. Vag. Bleeding: None.  Movement: Present. Denies leaking of fluid.   The following portions of the patient's history were reviewed and updated as appropriate: allergies, current medications, past family history, past medical history, past social history, past surgical history and problem list. Problem list updated.  Objective:   Vitals:   08/19/21 1141 08/19/21 1201  BP: (!) 139/97 123/73  Pulse: 77 77  Weight: 152 lb (68.9 kg)     Fetal Status: Fetal Heart Rate (bpm): 150   Movement: Present  Presentation: Vertex  General:  Alert, oriented and cooperative. Patient is in no acute distress.  Skin: Skin is warm and dry. No rash noted.   Cardiovascular: Normal heart rate noted  Respiratory: Normal respiratory effort, no problems with respiration noted  Abdomen: Soft, gravid, appropriate for gestational age. Pain/Pressure: Present     Pelvic:  Cervical exam performed Dilation: 1 Effacement (%): 60, 70 Station: -2  Extremities: Normal range of motion.  Edema: Trace  Mental Status: Normal mood and affect. Normal behavior. Normal judgment and thought content.   Urinalysis:      Assessment and Plan:  Pregnancy: G4P1021 at [redacted]w[redacted]d  1. Supervision of other normal pregnancy, antepartum Noticing braxton hicks contractions Has increased PO fluids Baby is moving well  - GC/Chlamydia probe amp ([place])not at [facility] - Culture, beta strep (group b only)  2.  Previous cesarean section complicating pregnancy, antepartum condition or complication Plans TOLAC  3. Sickle cell trait (HCC)   4. [redacted] weeks gestation of pregnancy   Term labor symptoms and general obstetric precautions including but not limited to vaginal bleeding, contractions, leaking of fluid and fetal movement were reviewed in detail with the patient. Please refer to After Visit Summary for other counseling recommendations.  Return in about 1 week (around 08/26/2021) for ROB.  Earlie Server, RN, MSN, NP-BC Nurse Practitioner, [facility] for [employer], [employer] [facility] 08/19/2021 12:18 PM

## 2021-08-20 LAB — GC/CHLAMYDIA PROBE AMP (~~LOC~~) NOT AT ARMC
Chlamydia: NEGATIVE
Comment: NEGATIVE
Comment: NORMAL
Neisseria Gonorrhea: POSITIVE — AB

## 2021-08-21 ENCOUNTER — Telehealth: Payer: Self-pay

## 2021-08-21 NOTE — Telephone Encounter (Addendum)
-----   Message from Currie Paris, NP sent at 08/21/2021  7:29 AM EST ----- Positive gonorrhea Please call to come in for treatment ASAP.  Discuss partner treatment and no intercourse until 7 days after both partners are treated.  Report to HD.  Pt notified of results and to make sure that her partner(s) get treated as well abstaining from intercourse for at least a week after both have been treated.  Pt stated that she will be able to come in tomorrow at 1030 for treatment.  Front office notified to place pt on the schedule.  STD card faxed to [facility].    Addison Naegeli, RN  08/21/21

## 2021-08-21 NOTE — Progress Notes (Signed)
Positive gonorrhea Please call to come in for treatment ASAP.  Discuss partner treatment and no intercourse until 7 days after both partners are treated.  Report to HD.

## 2021-08-22 ENCOUNTER — Other Ambulatory Visit: Payer: Self-pay

## 2021-08-22 ENCOUNTER — Ambulatory Visit (INDEPENDENT_AMBULATORY_CARE_PROVIDER_SITE_OTHER): Payer: Medicaid Other | Admitting: *Deleted

## 2021-08-22 VITALS — BP 126/82 | HR 78 | Wt 153.3 lb

## 2021-08-22 DIAGNOSIS — Z3480 Encounter for supervision of other normal pregnancy, unspecified trimester: Secondary | ICD-10-CM | POA: Diagnosis not present

## 2021-08-22 DIAGNOSIS — O98219 Gonorrhea complicating pregnancy, unspecified trimester: Secondary | ICD-10-CM | POA: Diagnosis not present

## 2021-08-22 DIAGNOSIS — O34219 Maternal care for unspecified type scar from previous cesarean delivery: Secondary | ICD-10-CM

## 2021-08-22 MED ORDER — CEFTRIAXONE SODIUM 500 MG IJ SOLR
500.0000 mg | Freq: Once | INTRAMUSCULAR | Status: AC
  Administered 2021-08-22: 500 mg via INTRAMUSCULAR

## 2021-08-22 NOTE — Progress Notes (Signed)
[place] here for nurse visit for treatment of gonorrhea. Treatment per protocol with Rocephin. Tolerated without complaint. Advised partner must be treated also and no intercourse until both treated and wait 2 weeks. She voices understanding. Staci Acosta

## 2021-08-23 LAB — CULTURE, BETA STREP (GROUP B ONLY): Strep Gp B Culture: NEGATIVE

## 2021-08-25 ENCOUNTER — Encounter: Payer: Self-pay | Admitting: Nurse Practitioner

## 2021-08-25 NOTE — Progress Notes (Signed)
Patient was assessed and managed by nursing staff during this encounter. I have reviewed the chart and agree with the documentation and plan. I have also made any necessary editorial changes.  Mora Bellman, MD 08/25/2021 12:32 PM

## 2021-08-26 ENCOUNTER — Ambulatory Visit (INDEPENDENT_AMBULATORY_CARE_PROVIDER_SITE_OTHER): Payer: Medicaid Other | Admitting: Nurse Practitioner

## 2021-08-26 ENCOUNTER — Other Ambulatory Visit: Payer: Self-pay

## 2021-08-26 VITALS — BP 126/72 | HR 84 | Wt 152.8 lb

## 2021-08-26 DIAGNOSIS — O98219 Gonorrhea complicating pregnancy, unspecified trimester: Secondary | ICD-10-CM

## 2021-08-26 DIAGNOSIS — Z3480 Encounter for supervision of other normal pregnancy, unspecified trimester: Secondary | ICD-10-CM

## 2021-08-26 DIAGNOSIS — Z3A37 37 weeks gestation of pregnancy: Secondary | ICD-10-CM

## 2021-08-26 DIAGNOSIS — O34219 Maternal care for unspecified type scar from previous cesarean delivery: Secondary | ICD-10-CM

## 2021-08-26 NOTE — Progress Notes (Signed)
° ° °  Subjective:  Shari Davidson is a 31 y.o. [phone] at [redacted]w[redacted]d being seen today for ongoing prenatal care.  She is currently monitored for the following issues for this low-risk pregnancy and has Asthma; Depression, major, recurrent, moderate (HCC); Supervision of other normal pregnancy, antepartum; Sickle cell trait (HCC); Previous cesarean section complicating pregnancy, antepartum condition or complication; and Chronic constipation on their problem list.  Patient reports no complaints.  Contractions: Irritability. Vag. Bleeding: None.  Movement: Present. Denies leaking of fluid.   The following portions of the patient's history were reviewed and updated as appropriate: allergies, current medications, past family history, past medical history, past social history, past surgical history and problem list. Problem list updated.  Objective:   Vitals:   08/26/21 1123  BP: 126/72  Pulse: 84  Weight: 152 lb 12.8 oz (69.3 kg)    Fetal Status: Fetal Heart Rate (bpm): 158   Movement: Present  Presentation: Vertex  General:  Alert, oriented and cooperative. Patient is in no acute distress.  Skin: Skin is warm and dry. No rash noted.   Cardiovascular: Normal heart rate noted  Respiratory: Normal respiratory effort, no problems with respiration noted  Abdomen: Soft, gravid, appropriate for gestational age. Pain/Pressure: Present     Pelvic:  Cervical exam performed Dilation: 1 Effacement (%): 70 Station: -2  Extremities: Normal range of motion.  Edema: Trace  Mental Status: Normal mood and affect. Normal behavior. Normal judgment and thought content.   Urinalysis:      Assessment and Plan:  Pregnancy: G4P1021 at [redacted]w[redacted]d  1. Supervision of other normal pregnancy, antepartum Having contractions that seem to be the start of labor and then they stop. Requesting cervical exam Baby moving well  2. Previous cesarean section complicating pregnancy, antepartum condition or  complication   3. Gonorrhea affecting pregnancy, antepartum Got treatment and notified her partner to get treated  4. [redacted] weeks gestation of pregnancy   Term labor symptoms and general obstetric precautions including but not limited to vaginal bleeding, contractions, leaking of fluid and fetal movement were reviewed in detail with the patient. Please refer to After Visit Summary for other counseling recommendations.  Return in about 1 week (around 09/02/2021) for ROB in person.  Nolene Bernheim, RN, MSN, NP-BC Nurse Practitioner, [facility] for [employer], [facility] 08/26/2021 11:39 AM

## 2021-09-02 ENCOUNTER — Encounter (HOSPITAL_COMMUNITY): Payer: Self-pay | Admitting: Obstetrics and Gynecology

## 2021-09-02 ENCOUNTER — Inpatient Hospital Stay (EMERGENCY_DEPARTMENT_HOSPITAL)
Admission: AD | Admit: 2021-09-02 | Discharge: 2021-09-02 | Disposition: A | Discharge status: Home / Self Care | Payer: Medicaid Other | Source: Home / Self Care | Attending: Obstetrics and Gynecology | Admitting: Obstetrics and Gynecology

## 2021-09-02 ENCOUNTER — Other Ambulatory Visit: Payer: Self-pay

## 2021-09-02 DIAGNOSIS — O479 False labor, unspecified: Secondary | ICD-10-CM

## 2021-09-02 DIAGNOSIS — O34219 Maternal care for unspecified type scar from previous cesarean delivery: Secondary | ICD-10-CM

## 2021-09-02 DIAGNOSIS — Z3A38 38 weeks gestation of pregnancy: Secondary | ICD-10-CM

## 2021-09-02 DIAGNOSIS — Z3689 Encounter for other specified antenatal screening: Secondary | ICD-10-CM

## 2021-09-02 DIAGNOSIS — O471 False labor at or after 37 completed weeks of gestation: Secondary | ICD-10-CM | POA: Insufficient documentation

## 2021-09-02 DIAGNOSIS — Z3480 Encounter for supervision of other normal pregnancy, unspecified trimester: Secondary | ICD-10-CM

## 2021-09-02 NOTE — MAU Note (Signed)
.  Shari Davidson is a 31 y.o. at [redacted]w[redacted]d here in MAU reporting: ctxs every 4-5 minutes since 1000 this morning.  Denies VB or LOF. ? ?Onset of complaint: today @ 1000 ?Pain score: 9 ?Vitals:  ? 09/02/21 1354  ?BP: 133/83  ?Pulse: 94  ?Resp: 18  ?Temp: 98.5 ?F (36.9 ?C)  ?SpO2: 97%  ?   ?FHT: 146 bpm, +FM less than usual ?Lab orders placed from triage:    ?

## 2021-09-02 NOTE — MAU Provider Note (Signed)
Per RN: Ms. Shari Davidson is a 31 y.o. [phone] at [redacted]w[redacted]d  who presents to MAU today complaining contractions q 4-5 minutes since 10 am. No VB or LOF. +FM. Hx of previous CS, planning TOLAC.  ? ?O: BP 129/85 (BP Location: Right Arm)   Pulse (!) 114   Temp 98.5 ?F (36.9 ?C) (Oral)   Resp 18   Ht 5\' 4"  (1.626 m)   Wt 68.3 kg   LMP 12/09/2020   SpO2 100%   BMI 25.85 kg/m?  ? ?Cervical exam:  ?Dilation: 3.5 ?Effacement (%): 70 ?Cervical Position: Posterior ?Station: -2 ?Presentation: Vertex ?Exam by:: Truitt Leep, RNC ? ?Fetal Monitoring: ?Baseline: 150 ?Variability: mod ?Accelerations: + ?Decelerations: no ?Contractions: 2-6 ? ?MDM: no cervical change, stable for discharge. ?A: ?SIUP at [redacted]w[redacted]d  ?False labor ?Reactive NST ? ?P: ?Discharge home ?Follow up @[facility]  as scheduled ?Labor precautions ? ?Julianne Handler, CNM ?09/02/2021 5:30 PM ? ?

## 2021-09-03 ENCOUNTER — Inpatient Hospital Stay (HOSPITAL_COMMUNITY): Payer: Medicaid Other | Admitting: Anesthesiology

## 2021-09-03 ENCOUNTER — Inpatient Hospital Stay (HOSPITAL_COMMUNITY)
Admission: AD | Admit: 2021-09-03 | Discharge: 2021-09-05 | DRG: 787 | Disposition: A | Discharge status: Home / Self Care | Payer: Medicaid Other | Attending: Obstetrics and Gynecology | Admitting: Obstetrics and Gynecology

## 2021-09-03 ENCOUNTER — Encounter (HOSPITAL_COMMUNITY)
Admission: AD | Disposition: A | Discharge status: Home / Self Care | Payer: Self-pay | Source: Home / Self Care | Attending: Obstetrics and Gynecology

## 2021-09-03 ENCOUNTER — Other Ambulatory Visit: Payer: Self-pay

## 2021-09-03 ENCOUNTER — Encounter (HOSPITAL_COMMUNITY): Payer: Self-pay | Admitting: Obstetrics and Gynecology

## 2021-09-03 DIAGNOSIS — O26833 Pregnancy related renal disease, third trimester: Secondary | ICD-10-CM | POA: Diagnosis not present

## 2021-09-03 DIAGNOSIS — O9952 Diseases of the respiratory system complicating childbirth: Secondary | ICD-10-CM | POA: Diagnosis present

## 2021-09-03 DIAGNOSIS — O9081 Anemia of the puerperium: Secondary | ICD-10-CM | POA: Diagnosis not present

## 2021-09-03 DIAGNOSIS — Z3480 Encounter for supervision of other normal pregnancy, unspecified trimester: Secondary | ICD-10-CM

## 2021-09-03 DIAGNOSIS — Z3A38 38 weeks gestation of pregnancy: Secondary | ICD-10-CM

## 2021-09-03 DIAGNOSIS — J45909 Unspecified asthma, uncomplicated: Secondary | ICD-10-CM | POA: Diagnosis present

## 2021-09-03 DIAGNOSIS — O34211 Maternal care for low transverse scar from previous cesarean delivery: Secondary | ICD-10-CM | POA: Diagnosis present

## 2021-09-03 DIAGNOSIS — O34219 Maternal care for unspecified type scar from previous cesarean delivery: Secondary | ICD-10-CM | POA: Diagnosis present

## 2021-09-03 DIAGNOSIS — O1424 HELLP syndrome, complicating childbirth: Secondary | ICD-10-CM | POA: Diagnosis present

## 2021-09-03 DIAGNOSIS — Z87891 Personal history of nicotine dependence: Secondary | ICD-10-CM

## 2021-09-03 DIAGNOSIS — D62 Acute posthemorrhagic anemia: Secondary | ICD-10-CM | POA: Diagnosis not present

## 2021-09-03 DIAGNOSIS — D573 Sickle-cell trait: Secondary | ICD-10-CM | POA: Diagnosis present

## 2021-09-03 DIAGNOSIS — N179 Acute kidney failure, unspecified: Secondary | ICD-10-CM | POA: Diagnosis not present

## 2021-09-03 DIAGNOSIS — O9902 Anemia complicating childbirth: Secondary | ICD-10-CM | POA: Diagnosis not present

## 2021-09-03 DIAGNOSIS — Z20822 Contact with and (suspected) exposure to covid-19: Secondary | ICD-10-CM | POA: Diagnosis present

## 2021-09-03 DIAGNOSIS — Z98891 History of uterine scar from previous surgery: Secondary | ICD-10-CM

## 2021-09-03 DIAGNOSIS — O1414 Severe pre-eclampsia complicating childbirth: Secondary | ICD-10-CM | POA: Diagnosis not present

## 2021-09-03 DIAGNOSIS — O26893 Other specified pregnancy related conditions, third trimester: Secondary | ICD-10-CM | POA: Diagnosis present

## 2021-09-03 LAB — COMPREHENSIVE METABOLIC PANEL
ALT: 100 U/L — ABNORMAL HIGH (ref 0–44)
ALT: 83 U/L — ABNORMAL HIGH (ref 0–44)
ALT: 79 U/L — ABNORMAL HIGH (ref 0–44)
AST: 103 U/L — ABNORMAL HIGH (ref 15–41)
AST: 89 U/L — ABNORMAL HIGH (ref 15–41)
AST: 84 U/L — ABNORMAL HIGH (ref 15–41)
Albumin: 2.4 g/dL — ABNORMAL LOW (ref 3.5–5.0)
Albumin: 2.1 g/dL — ABNORMAL LOW (ref 3.5–5.0)
Albumin: 2 g/dL — ABNORMAL LOW (ref 3.5–5.0)
Alkaline Phosphatase: 143 U/L — ABNORMAL HIGH (ref 38–126)
Alkaline Phosphatase: 136 U/L — ABNORMAL HIGH (ref 38–126)
Alkaline Phosphatase: 133 U/L — ABNORMAL HIGH (ref 38–126)
Anion gap: 11 (ref 5–15)
Anion gap: 12 (ref 5–15)
Anion gap: 8 (ref 5–15)
BUN: 6 mg/dL (ref 6–20)
BUN: 7 mg/dL (ref 6–20)
BUN: <5 mg/dL — ABNORMAL LOW (ref 6–20)
CO2: 20 mmol/L — ABNORMAL LOW (ref 22–32)
CO2: 17 mmol/L — ABNORMAL LOW (ref 22–32)
CO2: 23 mmol/L (ref 22–32)
Calcium: 8.8 mg/dL — ABNORMAL LOW (ref 8.9–10.3)
Calcium: 7.9 mg/dL — ABNORMAL LOW (ref 8.9–10.3)
Calcium: 8.4 mg/dL — ABNORMAL LOW (ref 8.9–10.3)
Chloride: 105 mmol/L (ref 98–111)
Chloride: 106 mmol/L (ref 98–111)
Chloride: 108 mmol/L (ref 98–111)
Creatinine, Ser: 0.83 mg/dL (ref 0.44–1.00)
Creatinine, Ser: 1.06 mg/dL — ABNORMAL HIGH (ref 0.44–1.00)
Creatinine, Ser: 0.78 mg/dL (ref 0.44–1.00)
GFR, Estimated: >60 mL/min (ref >60)
GFR, Estimated: >60 mL/min (ref >60)
GFR, Estimated: >60 mL/min (ref >60)
Glucose, Bld: 118 mg/dL — ABNORMAL HIGH (ref 70–99)
Glucose, Bld: 208 mg/dL — ABNORMAL HIGH (ref 70–99)
Glucose, Bld: 98 mg/dL (ref 70–99)
Potassium: 3.7 mmol/L (ref 3.5–5.1)
Potassium: 3.9 mmol/L (ref 3.5–5.1)
Potassium: 4.2 mmol/L (ref 3.5–5.1)
Sodium: 136 mmol/L (ref 135–145)
Sodium: 135 mmol/L (ref 135–145)
Sodium: 139 mmol/L (ref 135–145)
Total Bilirubin: 0.5 mg/dL (ref 0.3–1.2)
Total Bilirubin: 0.5 mg/dL (ref 0.3–1.2)
Total Bilirubin: 0.5 mg/dL (ref 0.3–1.2)
Total Protein: 6.4 g/dL — ABNORMAL LOW (ref 6.5–8.1)
Total Protein: 5.1 g/dL — ABNORMAL LOW (ref 6.5–8.1)
Total Protein: 5.2 g/dL — ABNORMAL LOW (ref 6.5–8.1)

## 2021-09-03 LAB — CBC
HCT: 31 % — ABNORMAL LOW (ref 36.0–46.0)
HCT: 27.5 % — ABNORMAL LOW (ref 36.0–46.0)
HCT: 29.3 % — ABNORMAL LOW (ref 36.0–46.0)
Hemoglobin: 10.1 g/dL — ABNORMAL LOW (ref 12.0–15.0)
Hemoglobin: 8.9 g/dL — ABNORMAL LOW (ref 12.0–15.0)
Hemoglobin: 10 g/dL — ABNORMAL LOW (ref 12.0–15.0)
MCH: 29.9 pg (ref 26.0–34.0)
MCH: 30.7 pg (ref 26.0–34.0)
MCH: 30.7 pg (ref 26.0–34.0)
MCHC: 32.6 g/dL (ref 30.0–36.0)
MCHC: 32.4 g/dL (ref 30.0–36.0)
MCHC: 34.1 g/dL (ref 30.0–36.0)
MCV: 91.7 fL (ref 80.0–100.0)
MCV: 94.8 fL (ref 80.0–100.0)
MCV: 89.9 fL (ref 80.0–100.0)
Platelets: 205 10*3/uL (ref 150–400)
Platelets: 167 10*3/uL (ref 150–400)
Platelets: 201 10*3/uL (ref 150–400)
RBC: 3.38 MIL/uL — ABNORMAL LOW (ref 3.87–5.11)
RBC: 2.9 MIL/uL — ABNORMAL LOW (ref 3.87–5.11)
RBC: 3.26 MIL/uL — ABNORMAL LOW (ref 3.87–5.11)
RDW: 12.7 % (ref 11.5–15.5)
RDW: 12.5 % (ref 11.5–15.5)
RDW: 12.5 % (ref 11.5–15.5)
WBC: 9.7 10*3/uL (ref 4.0–10.5)
WBC: 11.5 10*3/uL — ABNORMAL HIGH (ref 4.0–10.5)
WBC: 10.9 10*3/uL — ABNORMAL HIGH (ref 4.0–10.5)
nRBC: 0 % (ref 0.0–0.2)
nRBC: 0 % (ref 0.0–0.2)
nRBC: 0 % (ref 0.0–0.2)

## 2021-09-03 LAB — PROTEIN / CREATININE RATIO, URINE
Creatinine, Urine: 162 mg/dL
Protein Creatinine Ratio: 0.2 mg/mg{Cre} — ABNORMAL HIGH (ref 0.00–0.15)
Total Protein, Urine: 33 mg/dL

## 2021-09-03 LAB — TYPE AND SCREEN
ABO/RH(D): O POS
Antibody Screen: NEGATIVE

## 2021-09-03 LAB — RESP PANEL BY RT-PCR (FLU A&B, COVID) ARPGX2
Influenza A by PCR: NEGATIVE
Influenza B by PCR: NEGATIVE
SARS Coronavirus 2 by RT PCR: NEGATIVE

## 2021-09-03 LAB — RPR: RPR Ser Ql: NONREACTIVE

## 2021-09-03 LAB — MAGNESIUM: Magnesium: 5.6 mg/dL — ABNORMAL HIGH (ref 1.7–2.4)

## 2021-09-03 SURGERY — Surgical Case
Anesthesia: Epidural

## 2021-09-03 MED ORDER — MAGNESIUM SULFATE BOLUS VIA INFUSION
6.0000 g | Freq: Once | INTRAVENOUS | Status: AC
  Administered 2021-09-03: 6 g via INTRAVENOUS
  Filled 2021-09-03: qty 1000

## 2021-09-03 MED ORDER — STERILE WATER FOR IRRIGATION IR SOLN
Status: DC | PRN
  Administered 2021-09-03: 1

## 2021-09-03 MED ORDER — PHENYLEPHRINE HCL-NACL 20-0.9 MG/250ML-% IV SOLN
INTRAVENOUS | Status: DC | PRN
Start: 2021-09-03 — End: 2021-09-03

## 2021-09-03 MED ORDER — OXYCODONE-ACETAMINOPHEN 5-325 MG PO TABS: 2.0000 | ORAL_TABLET | ORAL | Status: DC | PRN

## 2021-09-03 MED ORDER — SODIUM CHLORIDE 0.9 % IV SOLN
500.0000 mg | INTRAVENOUS | Status: AC
  Administered 2021-09-03: 500 mg via INTRAVENOUS

## 2021-09-03 MED ORDER — ACETAMINOPHEN 325 MG PO TABS: 650.0000 mg | ORAL_TABLET | ORAL | Status: DC | PRN

## 2021-09-03 MED ORDER — MENTHOL 3 MG MT LOZG: 1.0000 | LOZENGE | OROMUCOSAL | Status: DC | PRN

## 2021-09-03 MED ORDER — TERBUTALINE SULFATE 1 MG/ML IJ SOLN
INTRAMUSCULAR | Status: AC
  Filled 2021-09-03: qty 1

## 2021-09-03 MED ORDER — LABETALOL HCL 5 MG/ML IV SOLN: 80.0000 mg | INTRAVENOUS | Status: DC | PRN

## 2021-09-03 MED ORDER — OXYTOCIN-SODIUM CHLORIDE 30-0.9 UT/500ML-% IV SOLN
INTRAVENOUS | Status: AC
  Filled 2021-09-03: qty 500

## 2021-09-03 MED ORDER — OXYTOCIN-SODIUM CHLORIDE 30-0.9 UT/500ML-% IV SOLN
INTRAVENOUS | Status: DC | PRN
  Administered 2021-09-03 (×2): 500 mL via INTRAVENOUS

## 2021-09-03 MED ORDER — KETOROLAC TROMETHAMINE 30 MG/ML IJ SOLN
INTRAMUSCULAR | Status: AC
  Filled 2021-09-03: qty 1

## 2021-09-03 MED ORDER — LIDOCAINE HCL (PF) 1 % IJ SOLN: INTRAMUSCULAR | Status: DC | PRN

## 2021-09-03 MED ORDER — CEFAZOLIN SODIUM-DEXTROSE 2-3 GM-%(50ML) IV SOLR
INTRAVENOUS | Status: DC | PRN
  Administered 2021-09-03: 2 g via INTRAVENOUS

## 2021-09-03 MED ORDER — ONDANSETRON HCL 4 MG/2ML IJ SOLN
INTRAMUSCULAR | Status: DC | PRN
  Administered 2021-09-03: 4 mg via INTRAVENOUS

## 2021-09-03 MED ORDER — PROPOFOL 10 MG/ML IV BOLUS
INTRAVENOUS | Status: DC | PRN
  Administered 2021-09-03: 200 mg via INTRAVENOUS

## 2021-09-03 MED ORDER — DIBUCAINE (PERIANAL) 1 % EX OINT: 1.0000 "application " | TOPICAL_OINTMENT | CUTANEOUS | Status: DC | PRN

## 2021-09-03 MED ORDER — OXYCODONE-ACETAMINOPHEN 5-325 MG PO TABS: 1.0000 | ORAL_TABLET | ORAL | Status: DC | PRN

## 2021-09-03 MED ORDER — ONDANSETRON HCL 4 MG/2ML IJ SOLN: 4.0000 mg | Freq: Three times a day (TID) | INTRAMUSCULAR | Status: DC | PRN

## 2021-09-03 MED ORDER — SODIUM CHLORIDE 0.9% FLUSH: 9.0000 mL | INTRAVENOUS | Status: DC | PRN

## 2021-09-03 MED ORDER — TERBUTALINE SULFATE 1 MG/ML IJ SOLN: 0.2500 mg | Freq: Once | INTRAMUSCULAR | Status: AC

## 2021-09-03 MED ORDER — SUCCINYLCHOLINE CHLORIDE 200 MG/10ML IV SOSY
PREFILLED_SYRINGE | INTRAVENOUS | Status: DC | PRN
  Administered 2021-09-03: 160 mg via INTRAVENOUS

## 2021-09-03 MED ORDER — LACTATED RINGERS IV SOLN
500.0000 mL | Freq: Once | INTRAVENOUS | Status: AC
  Administered 2021-09-03: 500 mL via INTRAVENOUS

## 2021-09-03 MED ORDER — DIPHENHYDRAMINE HCL 25 MG PO CAPS: 25.0000 mg | ORAL_CAPSULE | ORAL | Status: DC | PRN

## 2021-09-03 MED ORDER — AMISULPRIDE (ANTIEMETIC) 5 MG/2ML IV SOLN
10.0000 mg | Freq: Once | INTRAVENOUS | Status: DC | PRN
  Filled 2021-09-03: qty 4

## 2021-09-03 MED ORDER — SCOPOLAMINE 1 MG/3DAYS TD PT72
1.0000 | MEDICATED_PATCH | Freq: Once | TRANSDERMAL | Status: DC
  Administered 2021-09-03: 1.5 mg via TRANSDERMAL

## 2021-09-03 MED ORDER — ACETAMINOPHEN 500 MG PO TABS: 1000.0000 mg | ORAL_TABLET | Freq: Four times a day (QID) | ORAL | Status: DC

## 2021-09-03 MED ORDER — SODIUM CHLORIDE 0.9 % IV SOLN
INTRAVENOUS | Status: AC
  Filled 2021-09-03: qty 5

## 2021-09-03 MED ORDER — OXYTOCIN BOLUS FROM INFUSION: 333.0000 mL | Freq: Once | INTRAVENOUS | Status: DC

## 2021-09-03 MED ORDER — WITCH HAZEL-GLYCERIN EX PADS: 1.0000 "application " | MEDICATED_PAD | CUTANEOUS | Status: DC | PRN

## 2021-09-03 MED ORDER — KETOROLAC TROMETHAMINE 30 MG/ML IJ SOLN: 30.0000 mg | Freq: Four times a day (QID) | INTRAMUSCULAR | Status: DC | PRN

## 2021-09-03 MED ORDER — EPHEDRINE 5 MG/ML INJ: 10.0000 mg | INTRAVENOUS | Status: DC | PRN

## 2021-09-03 MED ORDER — OXYTOCIN-SODIUM CHLORIDE 30-0.9 UT/500ML-% IV SOLN: 2.5000 [IU]/h | INTRAVENOUS | Status: AC

## 2021-09-03 MED ORDER — FENTANYL CITRATE (PF) 100 MCG/2ML IJ SOLN
INTRAMUSCULAR | Status: DC | PRN
  Administered 2021-09-03: 100 ug via EPIDURAL

## 2021-09-03 MED ORDER — MEASLES, MUMPS & RUBELLA VAC IJ SOLR: 0.5000 mL | Freq: Once | INTRAMUSCULAR | Status: DC

## 2021-09-03 MED ORDER — SOD CITRATE-CITRIC ACID 500-334 MG/5ML PO SOLN: 30.0000 mL | ORAL | Status: DC

## 2021-09-03 MED ORDER — LIDOCAINE-EPINEPHRINE (PF) 2 %-1:200000 IJ SOLN
INTRAMUSCULAR | Status: DC | PRN
Start: 2021-09-03 — End: 2021-09-03
  Administered 2021-09-03 (×2): 10 mL via EPIDURAL
  Administered 2021-09-03 (×2): 5 mL via EPIDURAL

## 2021-09-03 MED ORDER — SOD CITRATE-CITRIC ACID 500-334 MG/5ML PO SOLN: 30.0000 mL | ORAL | Status: DC | PRN

## 2021-09-03 MED ORDER — SIMETHICONE 80 MG PO CHEW
80.0000 mg | CHEWABLE_TABLET | Freq: Three times a day (TID) | ORAL | Status: DC
  Administered 2021-09-03 – 2021-09-05 (×5): 80 mg via ORAL
  Filled 2021-09-03 (×5): qty 1

## 2021-09-03 MED ORDER — OXYCODONE HCL 5 MG/5ML PO SOLN: 5.0000 mg | Freq: Once | ORAL | Status: DC | PRN

## 2021-09-03 MED ORDER — MEPERIDINE HCL 25 MG/ML IJ SOLN
6.2500 mg | INTRAMUSCULAR | Status: DC | PRN
  Administered 2021-09-03: 6.25 mg via INTRAVENOUS

## 2021-09-03 MED ORDER — LIDOCAINE HCL (PF) 1 % IJ SOLN: 30.0000 mL | INTRAMUSCULAR | Status: DC | PRN

## 2021-09-03 MED ORDER — DIPHENHYDRAMINE HCL 50 MG/ML IJ SOLN: 12.5000 mg | Freq: Four times a day (QID) | INTRAMUSCULAR | Status: DC | PRN

## 2021-09-03 MED ORDER — FENTANYL-BUPIVACAINE-NACL 0.5-0.125-0.9 MG/250ML-% EP SOLN
12.0000 mL/h | EPIDURAL | Status: DC | PRN
  Administered 2021-09-03: 12 mL/h via EPIDURAL
  Filled 2021-09-03: qty 250

## 2021-09-03 MED ORDER — LABETALOL HCL 200 MG PO TABS
200.0000 mg | ORAL_TABLET | Freq: Two times a day (BID) | ORAL | Status: DC
  Administered 2021-09-03 – 2021-09-04 (×3): 200 mg via ORAL
  Filled 2021-09-03 (×3): qty 1

## 2021-09-03 MED ORDER — TRANEXAMIC ACID-NACL 1000-0.7 MG/100ML-% IV SOLN
INTRAVENOUS | Status: DC | PRN
  Administered 2021-09-03: 1000 mg via INTRAVENOUS

## 2021-09-03 MED ORDER — OXYCODONE HCL 5 MG PO TABS
5.0000 mg | ORAL_TABLET | ORAL | Status: DC | PRN
  Administered 2021-09-03 – 2021-09-05 (×5): 5 mg via ORAL
  Filled 2021-09-03 (×5): qty 1

## 2021-09-03 MED ORDER — LACTATED RINGERS AMNIOINFUSION: INTRAVENOUS | Status: DC

## 2021-09-03 MED ORDER — MEPERIDINE HCL 25 MG/ML IJ SOLN
INTRAMUSCULAR | Status: AC
  Filled 2021-09-03: qty 1

## 2021-09-03 MED ORDER — FENTANYL CITRATE (PF) 100 MCG/2ML IJ SOLN: 25.0000 ug | INTRAMUSCULAR | Status: DC | PRN

## 2021-09-03 MED ORDER — HYDRALAZINE HCL 20 MG/ML IJ SOLN: 10.0000 mg | INTRAMUSCULAR | Status: DC | PRN

## 2021-09-03 MED ORDER — SIMETHICONE 80 MG PO CHEW: 80.0000 mg | CHEWABLE_TABLET | ORAL | Status: DC | PRN

## 2021-09-03 MED ORDER — LABETALOL HCL 5 MG/ML IV SOLN: 20.0000 mg | INTRAVENOUS | Status: DC | PRN

## 2021-09-03 MED ORDER — ONDANSETRON HCL 4 MG/2ML IJ SOLN: 4.0000 mg | Freq: Four times a day (QID) | INTRAMUSCULAR | Status: DC | PRN

## 2021-09-03 MED ORDER — LACTATED RINGERS IV SOLN: INTRAVENOUS | Status: DC

## 2021-09-03 MED ORDER — NALOXONE HCL 0.4 MG/ML IJ SOLN: 0.4000 mg | INTRAMUSCULAR | Status: DC | PRN

## 2021-09-03 MED ORDER — ACETAMINOPHEN 10 MG/ML IV SOLN
INTRAVENOUS | Status: AC
  Filled 2021-09-03: qty 100

## 2021-09-03 MED ORDER — SENNOSIDES-DOCUSATE SODIUM 8.6-50 MG PO TABS
2.0000 | ORAL_TABLET | ORAL | Status: DC
  Administered 2021-09-04: 12:00:00 2 via ORAL
  Filled 2021-09-03: qty 2

## 2021-09-03 MED ORDER — PRENATAL MULTIVITAMIN CH
1.0000 | ORAL_TABLET | Freq: Every day | ORAL | Status: DC
  Administered 2021-09-04 – 2021-09-05 (×2): 1 via ORAL
  Filled 2021-09-03 (×2): qty 1

## 2021-09-03 MED ORDER — CEFAZOLIN SODIUM-DEXTROSE 2-4 GM/100ML-% IV SOLN: 2.0000 g | INTRAVENOUS | Status: DC

## 2021-09-03 MED ORDER — TRANEXAMIC ACID-NACL 1000-0.7 MG/100ML-% IV SOLN
INTRAVENOUS | Status: AC
  Filled 2021-09-03: qty 100

## 2021-09-03 MED ORDER — PHENYLEPHRINE 40 MCG/ML (10ML) SYRINGE FOR IV PUSH (FOR BLOOD PRESSURE SUPPORT)
80.0000 ug | PREFILLED_SYRINGE | INTRAVENOUS | Status: DC | PRN
  Administered 2021-09-03: 80 ug via INTRAVENOUS

## 2021-09-03 MED ORDER — DIPHENHYDRAMINE HCL 12.5 MG/5ML PO ELIX: 12.5000 mg | ORAL_SOLUTION | Freq: Four times a day (QID) | ORAL | Status: DC | PRN

## 2021-09-03 MED ORDER — CEFAZOLIN SODIUM-DEXTROSE 2-4 GM/100ML-% IV SOLN: 2.0000 g | Freq: Three times a day (TID) | INTRAVENOUS | Status: DC

## 2021-09-03 MED ORDER — DIPHENHYDRAMINE HCL 50 MG/ML IJ SOLN: 12.5000 mg | INTRAMUSCULAR | Status: DC | PRN

## 2021-09-03 MED ORDER — TERBUTALINE SULFATE 1 MG/ML IJ SOLN: 0.2500 mg | Freq: Once | INTRAMUSCULAR | Status: DC

## 2021-09-03 MED ORDER — ENOXAPARIN SODIUM 40 MG/0.4ML IJ SOSY
40.0000 mg | PREFILLED_SYRINGE | INTRAMUSCULAR | Status: DC
  Administered 2021-09-04 – 2021-09-05 (×2): 40 mg via SUBCUTANEOUS
  Filled 2021-09-03 (×2): qty 0.4

## 2021-09-03 MED ORDER — FENTANYL CITRATE (PF) 100 MCG/2ML IJ SOLN: INTRAMUSCULAR | Status: DC | PRN

## 2021-09-03 MED ORDER — ZOLPIDEM TARTRATE 5 MG PO TABS: 5.0000 mg | ORAL_TABLET | Freq: Every evening | ORAL | Status: DC | PRN

## 2021-09-03 MED ORDER — OXYCODONE HCL 5 MG PO TABS: 5.0000 mg | ORAL_TABLET | Freq: Once | ORAL | Status: DC | PRN

## 2021-09-03 MED ORDER — FENTANYL CITRATE (PF) 250 MCG/5ML IJ SOLN
INTRAMUSCULAR | Status: AC
  Filled 2021-09-03: qty 5

## 2021-09-03 MED ORDER — IBUPROFEN 600 MG PO TABS: 600.0000 mg | ORAL_TABLET | Freq: Four times a day (QID) | ORAL | Status: DC

## 2021-09-03 MED ORDER — DIPHENHYDRAMINE HCL 25 MG PO CAPS: 25.0000 mg | ORAL_CAPSULE | Freq: Four times a day (QID) | ORAL | Status: DC | PRN

## 2021-09-03 MED ORDER — ACETAMINOPHEN 500 MG PO TABS
1000.0000 mg | ORAL_TABLET | Freq: Four times a day (QID) | ORAL | Status: DC
  Administered 2021-09-03 – 2021-09-05 (×5): 1000 mg via ORAL
  Filled 2021-09-03 (×6): qty 2

## 2021-09-03 MED ORDER — PHENYLEPHRINE 40 MCG/ML (10ML) SYRINGE FOR IV PUSH (FOR BLOOD PRESSURE SUPPORT)
80.0000 ug | PREFILLED_SYRINGE | INTRAVENOUS | Status: DC | PRN
  Filled 2021-09-03: qty 10

## 2021-09-03 MED ORDER — LABETALOL HCL 5 MG/ML IV SOLN: 40.0000 mg | INTRAVENOUS | Status: DC | PRN

## 2021-09-03 MED ORDER — ACETAMINOPHEN 10 MG/ML IV SOLN
1000.0000 mg | Freq: Once | INTRAVENOUS | Status: DC | PRN
  Administered 2021-09-03: 1000 mg via INTRAVENOUS

## 2021-09-03 MED ORDER — OXYTOCIN-SODIUM CHLORIDE 30-0.9 UT/500ML-% IV SOLN
2.5000 [IU]/h | INTRAVENOUS | Status: DC
  Filled 2021-09-03: qty 500

## 2021-09-03 MED ORDER — SCOPOLAMINE 1 MG/3DAYS TD PT72
MEDICATED_PATCH | TRANSDERMAL | Status: AC
  Filled 2021-09-03: qty 1

## 2021-09-03 MED ORDER — MAGNESIUM SULFATE 40 GM/1000ML IV SOLN
2.0000 g/h | INTRAVENOUS | Status: DC
  Filled 2021-09-03: qty 1000

## 2021-09-03 MED ORDER — PHENYLEPHRINE 40 MCG/ML (10ML) SYRINGE FOR IV PUSH (FOR BLOOD PRESSURE SUPPORT)
PREFILLED_SYRINGE | INTRAVENOUS | Status: DC | PRN
  Administered 2021-09-03: 80 ug via INTRAVENOUS
  Administered 2021-09-03: 120 ug via INTRAVENOUS
  Administered 2021-09-03 (×3): 80 ug via INTRAVENOUS

## 2021-09-03 MED ORDER — NALOXONE HCL 4 MG/10ML IJ SOLN
1.0000 ug/kg/h | INTRAVENOUS | Status: DC | PRN
  Filled 2021-09-03: qty 5

## 2021-09-03 MED ORDER — SODIUM CHLORIDE 0.9% FLUSH: 3.0000 mL | INTRAVENOUS | Status: DC | PRN

## 2021-09-03 MED ORDER — MAGNESIUM SULFATE 40 GM/1000ML IV SOLN
2.0000 g/h | INTRAVENOUS | Status: AC
  Administered 2021-09-04: 02:00:00 2 g/h via INTRAVENOUS
  Filled 2021-09-03 (×3): qty 1000

## 2021-09-03 MED ORDER — ONDANSETRON HCL 4 MG/2ML IJ SOLN: 4.0000 mg | Freq: Once | INTRAMUSCULAR | Status: DC | PRN

## 2021-09-03 MED ORDER — KETOROLAC TROMETHAMINE 30 MG/ML IJ SOLN
30.0000 mg | Freq: Four times a day (QID) | INTRAMUSCULAR | Status: DC | PRN
  Filled 2021-09-03: qty 1

## 2021-09-03 MED ORDER — SUCCINYLCHOLINE CHLORIDE 200 MG/10ML IV SOSY
PREFILLED_SYRINGE | INTRAVENOUS | Status: AC
  Filled 2021-09-03: qty 10

## 2021-09-03 MED ORDER — COCONUT OIL OIL: 1.0000 "application " | TOPICAL_OIL | Status: DC | PRN

## 2021-09-03 MED ORDER — HYDROMORPHONE 1 MG/ML IV SOLN
INTRAVENOUS | Status: DC
  Administered 2021-09-03: 2.7 mg via INTRAVENOUS
  Administered 2021-09-04 (×2): 0.6 mg via INTRAVENOUS
  Filled 2021-09-03: qty 30

## 2021-09-03 MED ORDER — PHENYLEPHRINE 40 MCG/ML (10ML) SYRINGE FOR IV PUSH (FOR BLOOD PRESSURE SUPPORT)
PREFILLED_SYRINGE | INTRAVENOUS | Status: AC
  Filled 2021-09-03: qty 20

## 2021-09-03 MED ORDER — LACTATED RINGERS IV SOLN
500.0000 mL | INTRAVENOUS | Status: DC | PRN
  Administered 2021-09-03: 500 mL via INTRAVENOUS

## 2021-09-03 MED ORDER — MORPHINE SULFATE (PF) 4 MG/ML IV SOLN
4.0000 mg | Freq: Once | INTRAVENOUS | Status: AC
  Administered 2021-09-03: 4 mg via INTRAMUSCULAR
  Filled 2021-09-03: qty 1

## 2021-09-03 SURGICAL SUPPLY — 36 items
APL SKNCLS STERI-STRIP NONHPOA (GAUZE/BANDAGES/DRESSINGS) ×1
BENZOIN TINCTURE PRP APPL 2/3 (GAUZE/BANDAGES/DRESSINGS) ×2 IMPLANT
CHLORAPREP W/TINT 26ML (MISCELLANEOUS) ×3 IMPLANT
CLAMP CORD UMBIL (MISCELLANEOUS) IMPLANT
CLOTH BEACON ORANGE TIMEOUT ST (SAFETY) ×2 IMPLANT
DRSG OPSITE POSTOP 4X10 (GAUZE/BANDAGES/DRESSINGS) ×2 IMPLANT
ELECT REM PT RETURN 9FT ADLT (ELECTROSURGICAL) ×2
ELECTRODE REM PT RTRN 9FT ADLT (ELECTROSURGICAL) ×1 IMPLANT
EXTRACTOR VACUUM M CUP 4 TUBE (SUCTIONS) IMPLANT
GLOVE BIOGEL PI IND STRL 7.0 (GLOVE) ×2 IMPLANT
GLOVE BIOGEL PI IND STRL 7.5 (GLOVE) ×2 IMPLANT
GLOVE BIOGEL PI INDICATOR 7.0 (GLOVE) ×2
GLOVE BIOGEL PI INDICATOR 7.5 (GLOVE) ×2
GLOVE ECLIPSE 7.5 STRL STRAW (GLOVE) ×2 IMPLANT
GOWN STRL REUS W/TWL LRG LVL3 (GOWN DISPOSABLE) ×6 IMPLANT
KIT ABG SYR 3ML LUER SLIP (SYRINGE) IMPLANT
NDL HYPO 25X5/8 SAFETYGLIDE (NEEDLE) IMPLANT
NEEDLE HYPO 25X5/8 SAFETYGLIDE (NEEDLE) IMPLANT
NS IRRIG 1000ML POUR BTL (IV SOLUTION) ×2 IMPLANT
PACK C SECTION WH (CUSTOM PROCEDURE TRAY) ×2 IMPLANT
PAD ABD 7.5X8 STRL (GAUZE/BANDAGES/DRESSINGS) ×1 IMPLANT
PAD OB MATERNITY 4.3X12.25 (PERSONAL CARE ITEMS) ×2 IMPLANT
PENCIL SMOKE EVAC W/HOLSTER (ELECTROSURGICAL) ×2 IMPLANT
RTRCTR C-SECT PINK 25CM LRG (MISCELLANEOUS) ×2 IMPLANT
SPONGE GAUZE 4X4 12PLY STER LF (GAUZE/BANDAGES/DRESSINGS) ×2 IMPLANT
STRIP CLOSURE SKIN 1/2X4 (GAUZE/BANDAGES/DRESSINGS) ×2 IMPLANT
SUT PLAIN 2 0 XLH (SUTURE) ×1 IMPLANT
SUT VIC AB 0 CT1 36 (SUTURE) ×2 IMPLANT
SUT VIC AB 0 CTX 36 (SUTURE) ×6
SUT VIC AB 0 CTX36XBRD ANBCTRL (SUTURE) ×2 IMPLANT
SUT VIC AB 2-0 CT1 27 (SUTURE) ×2
SUT VIC AB 2-0 CT1 TAPERPNT 27 (SUTURE) ×1 IMPLANT
SUT VIC AB 4-0 KS 27 (SUTURE) ×2 IMPLANT
TOWEL OR 17X24 6PK STRL BLUE (TOWEL DISPOSABLE) ×2 IMPLANT
TRAY FOLEY W/BAG SLVR 14FR LF (SET/KITS/TRAYS/PACK) ×2 IMPLANT
WATER STERILE IRR 1000ML POUR (IV SOLUTION) ×2 IMPLANT

## 2021-09-03 NOTE — Discharge Summary (Shared)
Postpartum Discharge Summary  Date of Service updated***     Patient Name: Shari Davidson DOB: 1990/10/06 MRN: [id number]  Date of admission: 09/03/2021 Delivery date:09/03/2021  Delivering provider: Laurey Arrow [place]  Date of discharge: 09/05/2021  Admitting diagnosis: Indication for care in labor or delivery [O75.9] Intrauterine pregnancy: [redacted]w[redacted]d     Secondary diagnosis:  Active Problems:   Asthma   Supervision of other normal pregnancy, antepartum   Sickle cell trait ([place])   Previous cesarean section complicating pregnancy, antepartum condition or complication   Status post repeat low transverse cesarean section  Additional problems: ***None    Discharge diagnosis: Term Pregnancy Delivered and ***                                              Post partum procedures:*** Augmentation: AROM Complications: None  Hospital course: Onset of Labor With Unplanned C/S   31 y.o. yo P3A2505 at [redacted]w[redacted]d was admitted in Active Labor on 09/03/2021. Patient had a labor course significant for patient in advanced dilation having significant recurrent variable decelerations after epidural placement and AROM that did not resolve with amnioinfusion. Her cervix was 9.5cm and swollen and head was zero station and given non-reassuring tracing she was taken for cesarean delivery. Delivery details as follows: Membrane Rupture Time/Date: 5:34 AM ,09/03/2021   Delivery Method:C-Section, Low Transverse  Details of operation can be found in separate operative note. Patient had an uncomplicated postpartum course.  She is ambulating,tolerating a regular diet, passing flatus, and urinating well.  Patient is discharged home in stable condition 09/05/21.  Newborn Data: Birth date:09/03/2021  Birth time:9:04 AM  Gender:Female  Living status:Living  Apgars:7 ,9  Weight:3969 g   Magnesium Sulfate received: {Mag received:30440022} BMZ received: No Rhophylac:N/A MMR:N/A T-DaP:Given prenatally Flu:  Yes Transfusion:{Transfusion received:30440034}  Physical exam  Vitals:   09/04/21 1938 09/04/21 2316 09/05/21 0328 09/05/21 0734  BP: (!) 118/56 110/64 (!) 110/54 115/60  Pulse: 98 90 91 82  Resp: $Remo'16 16 18 18  'uwWPn$ Temp: 98.9 F (37.2 C) 98.4 F (36.9 C) 98 F (36.7 C) 98.9 F (37.2 C)  TempSrc: Oral Oral Oral Oral  SpO2: 99% 99% 98% 95%  Weight:      Height:       General: {Exam; general:21111117} Lochia: {Desc; appropriate/inappropriate:30686::"appropriate"} Uterine Fundus: {Desc; firm/soft:30687} Incision: {Exam; incision:21111123} DVT Evaluation: {Exam; LZJ:6734193} Labs: Lab Results  Component Value Date   WBC 12.2 (H) 09/05/2021   HGB 9.3 (L) 09/05/2021   HCT 28.7 (L) 09/05/2021   MCV 91.7 09/05/2021   PLT 244 09/05/2021   CMP Latest Ref Rng & Units 09/05/2021  Glucose 70 - 99 mg/dL 100(H)  BUN 6 - 20 mg/dL 12  Creatinine 0.44 - 1.00 mg/dL 0.83  Sodium 135 - 145 mmol/L 138  Potassium 3.5 - 5.1 mmol/L 4.3  Chloride 98 - 111 mmol/L 104  CO2 22 - 32 mmol/L 26  Calcium 8.9 - 10.3 mg/dL 8.7(L)  Total Protein 6.5 - 8.1 g/dL 5.4(L)  Total Bilirubin 0.3 - 1.2 mg/dL <0.1(L)  Alkaline Phos 38 - 126 U/L 115  AST 15 - 41 U/L 36  ALT 0 - 44 U/L 50(H)   Flavia Shipper Score: Edinburgh Postnatal Depression Scale Screening Tool 09/05/2021  I have been able to laugh and see the funny side of things. 0  I have looked forward with enjoyment  to things. 0  I have blamed myself unnecessarily when things went wrong. 0  I have been anxious or worried for no good reason. 0  I have felt scared or panicky for no good reason. 0  Things have been getting on top of me. 0  I have been so unhappy that I have had difficulty sleeping. 0  I have felt sad or miserable. 0  I have been so unhappy that I have been crying. 0  The thought of harming myself has occurred to me. 0  Edinburgh Postnatal Depression Scale Total 0     After visit meds:  Allergies as of 09/05/2021   No Known Allergies       Medication List     TAKE these medications    albuterol 108 (90 Base) MCG/ACT inhaler Commonly known as: Proventil HFA Inhale 2 puffs into the lungs every 4 (four) hours as needed for shortness of breath or wheezing. for shortness of breath.   [employer] Prenatal Gummy 0.4 MG Chew Chew 1 each by mouth at bedtime.   famotidine 20 MG tablet Commonly known as: Pepcid Take 1 tablet (20 mg total) by mouth daily. Can increase to twice daily if need. For heartburn.   furosemide 20 MG tablet Commonly known as: LASIX Take 1 tablet (20 mg total) by mouth 2 (two) times daily for 2 days.   labetalol 100 MG tablet Commonly known as: NORMODYNE Take 1 tablet (100 mg total) by mouth 2 (two) times daily.   oxyCODONE-acetaminophen 5-325 MG tablet Commonly known as: PERCOCET/ROXICET Take 1-2 tablets by mouth every 6 (six) hours as needed.   simethicone 80 MG chewable tablet Commonly known as: MYLICON Chew 1 tablet (80 mg total) by mouth 4 (four) times daily as needed for flatulence.         Discharge home in stable condition Infant Feeding: {Baby feeding:23562} Infant Disposition:{CHL IP OB HOME WITH OBSJGG:83662} Discharge instruction: per After Visit Summary and Postpartum booklet. Activity: Advance as tolerated. Pelvic rest for 6 weeks.  Diet: {OB HUTM:54650354} Future Appointments: Future Appointments  Date Time Provider [place]  09/10/2021 10:20 AM [facility] NURSE [facility]  10/01/2021 10:15 AM Radene Gunning, MD [facility]   Follow up Visit:  [place] for [facility] at [facility]. Go in 5 day(s).   Specialty: Obstetrics and Gynecology Why: blood pressure and incision check Contact information: [address] [place] [address] [place] for [employer] at [facility]. Go in 26 day(s).   Specialty: Obstetrics and Gynecology Why: routine after  baby visit Contact information: [place] [address] [phone]               Message sent to [facility] by Dr. Cy Blamer on 3/8  Please schedule this patient for a In person postpartum visit in 4 weeks with the following provider: MD. Additional Postpartum F/U:Incision check 1 week and BP check 1 week  High risk pregnancy complicated by: {YKZLDJTTSVXB:93903} Delivery mode:  C-Section, Low Transverse  Anticipated Birth Control:  POPs   09/05/2021 Aletha Halim, MD

## 2021-09-03 NOTE — Anesthesia Procedure Notes (Signed)
Procedure Name: Intubation ?Date/Time: 09/03/2021 9:00 AM ?Performed by: Kyung Rudd, CRNA ?Pre-anesthesia Checklist: Patient identified, Emergency Drugs available, Suction available and Patient being monitored ?Patient Re-evaluated:Patient Re-evaluated prior to induction ?Oxygen Delivery Method: Circle system utilized ?Preoxygenation: Pre-oxygenation with 100% oxygen ?Induction Type: Rapid sequence and Cricoid Pressure applied ?Laryngoscope Size: Glidescope and 3 ?Tube type: Oral ?Tube size: 7.0 mm ?Number of attempts: 1 ?Airway Equipment and Method: Stylet ?Placement Confirmation: ETT inserted through vocal cords under direct vision, positive ETCO2 and breath sounds checked- equal and bilateral ?Secured at: 21 cm ?Tube secured with: Tape ?Dental Injury: Teeth and Oropharynx as per pre-operative assessment  ?Comments: Grade I view on screen.  ? ? ? ? ?

## 2021-09-03 NOTE — Transfer of Care (Signed)
Immediate Anesthesia Transfer of Care Note ? ?Patient: Shari Davidson ? ?Procedure(s) Performed: CESAREAN SECTION ? ?Patient Location: PACU ? ?Anesthesia Type:General ? ?Level of Consciousness: drowsy ? ?Airway & Oxygen Therapy: Patient Spontanous Breathing ? ?Post-op Assessment: Report given to RN, Post -op Vital signs reviewed and stable and Patient moving all extremities ? ?Post vital signs: Reviewed and stable ? ?Last Vitals:  ?Vitals Value Taken Time  ?BP 116/64 09/03/21 1000  ?Temp    ?Pulse 135 09/03/21 1007  ?Resp 24 09/03/21 1007  ?SpO2 99 % 09/03/21 1007  ?Vitals shown include unvalidated device data. ? ?Last Pain:  ?Vitals:  ? 09/03/21 0650  ?TempSrc: Axillary  ?PainSc:   ?   ? ?  ? ?Complications: No notable events documented. ?

## 2021-09-03 NOTE — Op Note (Addendum)
Cesarean Section Operative Report ? ?[place] ? ?09/03/2021 ? ?Indications: fetal indications ? ?Pre-operative Diagnosis: repeat low transverse cesarean section ? ?Post-operative Diagnosis: Same  ? ?Surgeon: Surgeon(s) and Role: ?   * Adlai Nieblas, Ailene Rud, MD - Primary ?   Renard Matter, MD - Assisting  ? ?Attending Attestation: I was present and scrubbed for the entire procedure.  ? ? ?Anesthesia: epidural converted to general ?  ?Estimated Blood Loss: 362 ml ? ?Total IV Fluids: 500 ml LR ? ?Urine Output:: 100 ml dark yellow urine ? ?Specimens: none ? ?Findings: Viable female infant in cephalic presentation; Apgars pending; weight pending; arterial cord pH 7.15;  meconium-stained amniotic fluid; intact placenta with three vessel cord; normal uterus, fallopian tubes and ovaries bilaterally. ? ?Baby condition / location:  Nursery  ? ?Complications: no complications. No significant adhesive disease. ? ?Indications: ?Shari Davidson is a 31 y.o. [phone] with an IUP [redacted]w[redacted]d presenting for SOL. This was a TOLAC. Proceded to 9.5 centimeters with recurrent deep late decelerations. Unable to push past cervical lip. Decision made to proceed urgently to c/s. Decision made at 8:30. Incision 9:00. Delivery 9:04. There was an approximately 5 minute delay due to the need for general anesthesia. ? ?The risks, benefits, complications, treatment options, and exected outcomes were discussed with the patient . The patient dwith the proposed plan, giving informed consent. identified as [place] and the procedure verified as C-Section Delivery. ? ?Procedure Details:  ?The patient was taken back to the operative suite where epidrual anesthesia was dosed. ? ?A time out was held and the above information confirmed.  ? ?After induction of anesthesia, fetal heart tones were checked and found to be 180. The patient was draped and prepped in the usual sterile manner and placed in a dorsal supine position  with a leftward tilt. Anesthesia found to be inadequate, was dosed and again found to be inadequate, and so decision made to proceed w/ general anesthesia. A Pfannenstiel incision was made and carried down through the subcutaneous tissue to the fascia. Fascial incision was made and sharply then bluntly extended transversely. The fascia was separated from the underlying rectus tissue superiorly and inferiorly. The peritoneum was identified and bluntly entered and extended longitudinally. Alexis retractor was placed. A bladder flap was not created. A low transverse uterine incision was made and extended bluntly. Delivered from cephalic presentation was a viable infant with Apgars and weight as above.  After waiting 30 seconds for delayed cord cutting, the umbilical cord was clamped and cut cord blood was obtained for evaluation. Cord ph was sent. The placenta was removed Intact and appeared normal. The uterine outline, tubes and ovaries appeared normal. The uterine incision was closed with running locked sutures of 0-Vicryl with an imbricating layer of the same.   Hemostasis was observed. The peritoneum was closed with 2-0 Vicryl. The rectus muscles were examined and hemostasis observed. The fascia was then reapproximated with running sutures of 0-Vicryl. The subcuticular closure was performed using 2-0 plain gut. The skin was closed with 4-0 Vicryl. ? ?Instrument, sponge, and needle counts were correct prior the abdominal closure and were correct at the conclusion of the case.  ? ?Urine output low normal, BPs were elevated at end of labor, and LFTs were also elevated. We will monitor closely for signs of preE and may start magnesium. ? ? ? ?Disposition: PACU - hemodynamically stable.  ? ?Maternal Condition: stable  ? ? ? ? ? ?Signed: ?Patsy Lager WoukMD 09/03/2021  9:40 AM ? ? ?

## 2021-09-03 NOTE — Progress Notes (Signed)
Shari Davidson is a 31 y.o. [phone] at [redacted]w[redacted]d admitted for active labor ? ?Subjective: ?comfortable with epidural, denies headache, visual changes, ruq/epigastric pain, n/v, and no complaints ? ?Objective: ?BP 126/90   Pulse 91   Temp 98.3 ?F (36.8 ?C) (Oral)   Ht 5\' 4"  (1.626 m)   Wt 68 kg   LMP 12/09/2020   SpO2 100%   BMI 25.75 kg/m?  ?No intake/output data recorded. ? ?FHR baseline 170 bpm, Variability: minimal , Accelerations:not present, Decelerations:  Absent- in attempt to improve tachycardia, turned pt from Rt lateral to Lt lateral, began to have late/variables, so turned back to Rt lateral, I came in @ 0639, SVE 8-9/80/0, then back to Rt lateral ?Toco: q 2-107mins  ? ?SVE:   Dilation: 9 ?Effacement (%): 80 ?Station: 0 ?Exam by:: K.Anaclara Acklin, CNM ? ?Labs: ?Lab Results  ?Component Value Date  ? WBC 9.7 09/03/2021  ? HGB 10.1 (L) 09/03/2021  ? HCT 31.0 (L) 09/03/2021  ? MCV 91.7 09/03/2021  ? PLT 205 09/03/2021  ? ? ?Assessment / Plan: ?Spontaneous labor, progressing normally, fetal tachycardia w/ decels, Dr. 11/03/2021 called @ [phone], will come evaluate strip ? ?Labor: active ?Fetal Wellbeing:  Category II ?Pain Control:  epidural ?Pre-eclampsia:  labs pending, bp stable, asymptomatic ?I/D:  GBS neg ?Anticipated MOD:  undetermined ? ?4540 CNM, WHNP-BC ?09/03/2021, 6:50 AM  ?

## 2021-09-03 NOTE — Anesthesia Procedure Notes (Signed)
Epidural ?Patient location during procedure: OB ?Start time: 09/03/2021 4:54 AM ?End time: 09/03/2021 5:06 AM ? ?Staffing ?Anesthesiologist: Lucretia Kern, MD ?Performed: anesthesiologist  ? ?Preanesthetic Checklist ?Completed: patient identified, IV checked, risks and benefits discussed, monitors and equipment checked, pre-op evaluation and timeout performed ? ?Epidural ?Patient position: sitting ?Prep: DuraPrep ?Patient monitoring: heart rate, continuous pulse ox and blood pressure ?Approach: midline ?Location: L3-L4 ?Injection technique: LOR air ? ?Needle:  ?Needle type: Tuohy  ?Needle gauge: 17 G ?Needle length: 9 cm ?Needle insertion depth: 5 cm ?Catheter type: closed end flexible ?Catheter size: 19 Gauge ?Catheter at skin depth: 10 cm ?Test dose: negative ? ?Assessment ?Events: blood not aspirated, injection not painful, no injection resistance, no paresthesia and negative IV test ? ?Additional Notes ?Reason for block:procedure for pain ? ? ? ?

## 2021-09-03 NOTE — Lactation Note (Addendum)
This note was copied from a baby's chart. ?Lactation Consultation Note ? ?Patient Name: Shari Davidson ?Today's Date: 09/03/2021 ?  ?Age: 30 hrs ?LC checked with RN, Jamesetta Orleans if this was a good time to see Mom. Mom in midst transfer to first floor due to elevated blood pressure to get started on Mg. LC to follow up after transfer. ? ?LC checked with RN, Jerrell Mylar to see if this was a good time to come. Mom sleeping due to Mg. Dad feeding infant a bottle with formula currently.  ? ?RN to alert [facility] staff when Mom able to work on latching and setting up her DEBP.   ? ?Maternal Data ?  ? ?Feeding ?  ? ?LATCH Score ?  ? ?  ? ?  ? ?  ? ?  ? ?  ? ? ?Lactation Tools Discussed/Used ?  ? ?Interventions ?  ? ?Discharge ?  ? ?Consult Status ?  ? ? ? ?Cherie Lasalle  Nicholson-Springer ?09/03/2021, 1:23 PM ? ? ? ?

## 2021-09-03 NOTE — Lactation Note (Signed)
This note was copied from a baby's chart. ?Lactation Consultation Note ? ?Patient Name: Shari Davidson ?Today's Date: 09/03/2021 ?  ?Age:30 hours ?Today's Date: 09/03/2021 ?Reason for consult: Initial assessment;Early term 37-38.6wks (2nd C/S delivery, per mom having pain but wants to try and latch infant at the breast, infant was cuing to breastfeed.) ?Mom's feeding choice is breastfeeding and supplementing with formula. ?Per mom, this is her 1st time latching infant at the breast, at 1530 infant was given 15 mls of formula. ?LC entered the room, infant was cuing to breastfeed, mom latched infant on her right breast using the football hold position, infant latched with depth, swallows observed, infant BF for 15 minutes. ?Afterwards mom used DEBP, was pumping as LC left the room, mom knows to pump every 3 hours for 15 minutes on initial setting. ?Mom shown how to use DEBP & how to disassemble, clean, & reassemble parts.  ?Mom's feeding plan: ?1- Mom will BF infant according to hunger cues, 8 to 12+ times within 24 hours, skin to skin. ?2- Mom will attempt to latch infant on both breast during a feeding. ?3- Mom knows to ask RN/LC for further latch assistance if needed. ?4- Mom plans to supplement infant with any EBM that she pumping or formula after latching infant at the breast, Day 1 mom offer 7-10 mls per feeding. ?5- Mom will continue to use DEBP every 3 hours for 15 minutes and mom will offer any EBM that is expressed first before formula by finger feeding or spoon. ?Maternal Data ?Has patient been taught Hand Expression?: No ?Does the patient have breastfeeding experience prior to this delivery?: No ?How long did the patient breastfeed?: Per mom, she did not BF her 67 month old daughter ?Maternal Data ?  ? ?Feeding ?Nipple Type: Slow - flow ? ?LATCH Score ?  ?See flow sheet -9 ?  ? ?  ? ?  ? ?  ? ?  ? ? ?Lactation Tools Discussed/Used ?  ? ?Interventions ?  ? ?Discharge ?  ? ?Consult Status ?  ? ? ? ?Danelle Earthly ?09/03/2021, 4:38 PM ? ? ? ?

## 2021-09-03 NOTE — Progress Notes (Signed)
Here at term in labor for tolac, has progressed to rim with fetal tachycardia and deep late decelerations with every contraction. Has a cervical lip that we have not been able to reduce and has attempted to push for 20 minutes. Will give terbutaline and proceed urgently to c/s. Antibiotics ordered ?

## 2021-09-03 NOTE — Anesthesia Postprocedure Evaluation (Signed)
Anesthesia Post Note ? ?Patient: Shari Davidson ? ?Procedure(s) Performed: CESAREAN SECTION ? ?  ? ?Patient location during evaluation: PACU ?Anesthesia Type: General ?Level of consciousness: awake ?Pain management: pain level controlled ?Vital Signs Assessment: post-procedure vital signs reviewed and stable ?Respiratory status: spontaneous breathing and respiratory function stable ?Cardiovascular status: stable ?Postop Assessment: no apparent nausea or vomiting ?Anesthetic complications: no ? ? ?No notable events documented. ? ?Last Vitals:  ?Vitals:  ? 09/03/21 1100 09/03/21 1108  ?BP: (!) 142/88 (!) 144/93  ?Pulse: 97 (!) 102  ?Resp: 18 16  ?Temp:  37.3 ?C  ?SpO2: 98% 99%  ?  ?Last Pain:  ?Vitals:  ? 09/03/21 1108  ?TempSrc: Oral  ?PainSc: 0-No pain  ? ?Pain Goal:   ? ?  ?  ?  ?  ?  ?  ?Epidural/Spinal Function Cutaneous sensation: Tingles (09/03/21 1108), Patient able to flex knees: Yes (09/03/21 1108), Patient able to lift hips off bed: Yes (09/03/21 1108), Back pain beyond tenderness at insertion site: No (09/03/21 1108), Progressively worsening motor and/or sensory loss: No (09/03/21 1108), Bowel and/or bladder incontinence post epidural: No (09/03/21 1108) ? ?Merlinda Frederick ? ? ? ? ?

## 2021-09-03 NOTE — Progress Notes (Signed)
Patient ID: Shari Davidson, female   DOB: [birth year], 31 y.o.   MRN: [id number] ?[id number] Reviewed strip w/ Dr. Debroah Loop, [phone] he went in to speak w/ pt. Will continue to monitor strip for now.  ?Cheral Marker, CNM, WHNP-BC ?09/03/2021 ?7:24 AM  ?

## 2021-09-03 NOTE — Addendum Note (Signed)
Addendum  created 09/03/21 1218 by Quentin Ore, CRNA  ? Flowsheet accepted  ?  ?

## 2021-09-03 NOTE — H&P (Addendum)
OBSTETRIC ADMISSION HISTORY AND PHYSICAL ? ?Shari Davidson is a 31 y.o. female [phone] with IUP at [redacted]w[redacted]d for TOLAC/SOL. She reports +FMs, No LOF, no VB, no blurry vision, headaches or peripheral edema, and RUQ pain.  She plans on breast feeding. She requests OCPs for birth control. ? ?She received her prenatal care at [facility]. ? ?Dating: By [place] --->  Estimated Date of Delivery: 09/11/21 ? ?Sono:   ? ?@[redacted]w[redacted]d , CWD, normal anatomy, breech presentation, anterior lie, 286g, 65% EFW ? ? ?Prenatal History/Complications: ?-Asthma ?-Sickle cell trait ?-Cesarean for nrFHT at 5 cm ?-+GC s/p tx ? ?Past Medical History: ?Past Medical History:  ?Diagnosis Date  ? Asthma   ? Chlamydia 08/06/11  ? treated at Urgent Care  ? Gonorrhea 04/20/12  ? treated at Urgent Care  ? Migraine   ? Myopia 07/08/2007  ? Qualifier: Diagnosis of  By: 09/05/2007 NP, Humberto Seals    ? Trichomonas contact, treated   ? ? ?Past Surgical History: ?Past Surgical History:  ?Procedure Laterality Date  ? CESAREAN SECTION  12/01/2019  ? Procedure: CESAREAN SECTION;  Surgeon: 01/31/2020, MD;  Location: [facility] LD ORS;  Service: Obstetrics;;  ? ? ?Obstetrical History: ?OB History   ? ? Gravida  ?4  ? Para  ?1  ? Term  ?1  ? Preterm  ?   ? AB  ?2  ? Living  ?1  ?  ? ? SAB  ?2  ? IAB  ?   ? Ectopic  ?   ? Multiple  ?0  ? Live Births  ?1  ?   ?  ?  ? ? ?Social History ?Social History  ? ?Socioeconomic History  ? Marital status: Single  ?  Spouse name: Not on file  ? Number of children: Not on file  ? Years of education: Not on file  ? Highest education level: High school graduate  ?Occupational History  ? Not on file  ?Tobacco Use  ? Smoking status: Former  ?  Packs/day: 0.30  ?  Types: Cigarettes  ?  Quit date: 08/30/2014  ?  Years since quitting: 7.0  ? Smokeless tobacco: Never  ?Vaping Use  ? Vaping Use: Never used  ?Substance and Sexual Activity  ? Alcohol use: Not Currently  ?  Comment: OCCASIONAL  ? Drug use: No  ? Sexual activity: Yes  ?  Birth control/protection: None   ?Other Topics Concern  ? Not on file  ?Social History Narrative  ?  Lives with mother and 2 sisters  ? ?Social Determinants of Health  ? ?Financial Resource Strain: Not on file  ?Food Insecurity: No Food Insecurity  ? Worried About 10/30/2014 in the Last Year: Never true  ? Ran Out of Food in the Last Year: Never true  ?Transportation Needs: No Transportation Needs  ? Lack of Transportation (Medical): No  ? Lack of Transportation (Non-Medical): No  ?Physical Activity: Not on file  ?Stress: Not on file  ?Social Connections: Not on file  ? ? ?Family History: ?Family History  ?Problem Relation Age of Onset  ? Hypertension Other   ? Healthy Mother   ? Healthy Father   ? ? ?Allergies: ?No Known Allergies ? ?Medications Prior to Admission  ?Medication Sig Dispense Refill Last Dose  ? famotidine (PEPCID) 20 MG tablet Take 1 tablet (20 mg total) by mouth daily. Can increase to twice daily if need. For heartburn. 60 tablet 3 Past Month  ? Prenatal Multivit-Min-FA ([facility] PRENATAL  GUMMY) 0.4 MG CHEW Chew 1 each by mouth at bedtime. 30 tablet 11 09/02/2021  ? albuterol (PROVENTIL HFA) 108 (90 Base) MCG/ACT inhaler Inhale 2 puffs into the lungs every 4 (four) hours as needed for shortness of breath or wheezing. for shortness of breath. 18 g 1 More than a month  ? ? ? ?Review of Systems  ? ?All systems reviewed and negative except as stated in HPI ? ?Height 5\' 4"  (1.626 m), weight 68 kg, last menstrual period 12/09/2020, not currently breastfeeding. ?General appearance: alert, cooperative, and no distress and in discomfort ?Lungs: Normal work of breathing on room air ?Heart: Regular 2+ pulses bilaterally ?Abdomen: Soft, non-tender ?Extremities: Homans sign is negative, no sign of DVT ?Presentation: cephalic ?Fetal monitoring - Baseline: 145 bpm, Variability: Good {> 6 bpm), Accelerations: Reactive, and Decelerations: Absent ?Uterine activityFrequency: Every 2 minutes ?Dilation: 5.5 ?Effacement (%): 90 ?Station: -1 ?Exam  by:: [email or web address], RN ? ? ?Prenatal labs: ?ABO, Rh: O/Positive/-- (08/31 1223) ?Antibody: Negative (08/31 1223) ?Rubella: 1.76 (08/31 1223) ?RPR: Non Reactive (12/19 0825)  ?HBsAg: Negative (08/31 1223)  ?HIV: Non Reactive (12/19 0825)  ?GBS: Negative/-- (02/21 1217)  ?1 hr Glucola: Normal ?Genetic screening: NIPS low risk/AFP negative ?Anatomy 01-11-1998: Normal ? ?Prenatal Transfer Tool  ?Maternal Diabetes: No ?Genetic Screening: Normal ?Maternal Ultrasounds/Referrals: Normal ?Fetal Ultrasounds or other Referrals:  Referred to Materal Fetal Medicine  ?Maternal Substance Abuse:  No ?Significant Maternal Medications:  None ?Significant Maternal Lab Results: Group B Strep negative ? ?No results found for this or any previous visit (from the past 24 hour(s)). ? ?Patient Active Problem List  ? Diagnosis Date Noted  ? Chronic constipation 02/20/2021  ? Previous cesarean section complicating pregnancy, antepartum condition or complication 12/01/2019  ? Sickle cell trait (HCC) 07/25/2019  ? Supervision of other normal pregnancy, antepartum 05/22/2019  ? Depression, major, recurrent, moderate (HCC)   ? Asthma 08/26/2006  ? ? ?Assessment/Plan:  ?Shari Davidson is a 31 y.o. [phone] at [redacted]w[redacted]d here for TOLAC with SOL. She has a hx of CS for nrFHT at 5 cm. ? ?#Labor: Following SOL, cervical change has been observed and dilation is at 5.5 cm. Expectant management ?#Pain: Epidural ?#FWB: Category 1 ?#ID:  GBS Negative, +GC s/p tx ?#MOF: Breast ?#MOC: OCPs (progesterone) ?#Circ:  N/A ? 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 ? ?I spoke with and examined patient and agree with resident/PA-S/MS/SNM's note and plan of care.  ?Pt admitted in SOL, received morphine 4mg  IM x 1 in MAU, progressed from 3.5 to 5cm. Prev c/s for NRFHR @ 5cm, wants TOLAC, consent signed previously. Wants epidural. Pregnancy uncomplicated other than recent +GC, was treated ?O: VSS, cat 1 FHR, regular uc's, SVE 5/90 ?A/P: [redacted]w[redacted]d SOL,  TOLAC, GBS neg, expectant mangement, plans to breastfeed and POPs for contraception ?6/90, CNM, WHNP-BC ?09/03/2021 ?4:59 AM  ?  ?

## 2021-09-03 NOTE — MAU Note (Signed)
PT SAYS SHE WAS HERE YESTERDAY - 3-4 CM ?  UC'S STRONGER ? ?

## 2021-09-03 NOTE — Progress Notes (Signed)
Patient ID: Shari Davidson, female   DOB: [birth year], 31 y.o.   MRN: [id number] ?[phone]- noted deep repetitive variables, notified Dr. Roselie Awkward, he went in to see her, SVE per him thick but soft ant lip, had pt push x 2 and he reduced lip. Wants her to continue pushing w/ nurses, will continue to monitor.  ?Roma Schanz, CNM, WHNP-BC ?09/03/2021 ?8:01 AM  ?

## 2021-09-03 NOTE — Progress Notes (Signed)
Patient ID: Shari Davidson, female   DOB: 11/14/1990, 31 y.o.   MRN: [id number] ?Shari Davidson is a 31 y.o. [phone] at [redacted]w[redacted]d admitted for active labor ? ?Called to room by RN for prolonged decel after epidural ?Subjective: ?Uncomfortable w/ contractions ? ?Objective: ?BP 126/90   Pulse 91   Temp 98.3 ?F (36.8 ?C) (Oral)   Ht 5\' 4"  (1.626 m)   Wt 68 kg   LMP 12/09/2020   SpO2 100%   BMI 25.75 kg/m?  ?No intake/output data recorded. ? ?FHR prior to epidural- had begun to have a few lates, but +accel right before being taken off of efm for epidural x 12/11/2020, after epidural FHR 130-145 x58min w/ mod variability, then into prolonged decel x 3m w/ nadir of 60. She was placed in Lt lateral, IV bolus started, I was called @ [phone] and entered room @ 0514, pt was in hands and knees and nurse was giving phenylephrine as I entered room. Decel resolved ~1.64min after I arrived, and then a few minutes later we placed pt in Lt lateral, baseline 155-160 w/ variables late in timing- I stepped out and called Dr. 4m @ [phone] and made him aware of all events. Went back in and pt turned to Rt lateral, began to have deep repetitive variables, so repeat SVE by 8416, SNM 9/90/-1, AROM thick particulate mec, IUPC placed, terbutaline given, Dr. Jon Gills called to come @ 0534, amnioinfusion started, pt placed in hands and knees again. Dr. Debroah Loop called @ [phone], in @ [phone]. FHR improved to baseline 155 w/ minimal variability, early x 1, then no decels noted, Dr. 0160 out of room, will continue to monitor closely ?Toco: regular, every 2-3 minutes ? ?Debroah Loop CNM, WHNP-BC ?09/03/2021, 5:42 AM  ?

## 2021-09-03 NOTE — Progress Notes (Signed)
I presented to patient's room where she was resting comfortably in bed. We reviewed labor course, indications for cesarean, indications for general anesthesia, patient's severe preeclampsia and how we will treat that, etc., answering all questions. ?

## 2021-09-03 NOTE — Anesthesia Preprocedure Evaluation (Signed)
Anesthesia Evaluation  ?Patient identified by MRN, date of birth, ID band ?Patient awake ? ? ? ?Reviewed: ?Allergy & Precautions, H&P , NPO status , Patient's Chart, lab work & pertinent test results ? ?History of Anesthesia Complications ?Negative for: history of anesthetic complications ? ?Airway ?Mallampati: II ? ?TM Distance: >3 FB ? ? ? ? Dental ?  ?Pulmonary ?asthma , former smoker,  ?  ?Pulmonary exam normal ? ? ? ? ? ? ? Cardiovascular ?negative cardio ROS ? ? ?Rhythm:regular Rate:Normal ? ? ?  ?Neuro/Psych ?Depression negative neurological ROS ? negative psych ROS  ? GI/Hepatic ?negative GI ROS, Neg liver ROS,   ?Endo/Other  ?negative endocrine ROS ? Renal/GU ?negative Renal ROS  ?negative genitourinary ?  ?Musculoskeletal ?negative musculoskeletal ROS ?(+)  ? Abdominal ?  ?Peds ? Hematology ?negative hematology ROS ?(+)   ?Anesthesia Other Findings ? ? Reproductive/Obstetrics ?(+) Pregnancy (TOLAC) ? ?  ? ? ? ? ? ? ? ? ? ? ? ? ? ?  ?  ? ? ? ? ? ? ? ? ?Anesthesia Physical ?Anesthesia Plan ? ?ASA: 2 ? ?Anesthesia Plan: Epidural  ? ?Post-op Pain Management:   ? ?Induction:  ? ?PONV Risk Score and Plan:  ? ?Airway Management Planned:  ? ?Additional Equipment:  ? ?Intra-op Plan:  ? ?Post-operative Plan:  ? ?Informed Consent: I have reviewed the patients History and Physical, chart, labs and discussed the procedure including the risks, benefits and alternatives for the proposed anesthesia with the patient or authorized representative who has indicated his/her understanding and acceptance.  ? ? ? ? ? ?Plan Discussed with:  ? ?Anesthesia Plan Comments:   ? ? ? ? ? ? ?Anesthesia Quick Evaluation ? ?

## 2021-09-04 LAB — CBC
HCT: 29.4 % — ABNORMAL LOW (ref 36.0–46.0)
Hemoglobin: 9.6 g/dL — ABNORMAL LOW (ref 12.0–15.0)
MCH: 29.6 pg (ref 26.0–34.0)
MCHC: 32.7 g/dL (ref 30.0–36.0)
MCV: 90.7 fL (ref 80.0–100.0)
Platelets: 216 10*3/uL (ref 150–400)
RBC: 3.24 MIL/uL — ABNORMAL LOW (ref 3.87–5.11)
RDW: 12.8 % (ref 11.5–15.5)
WBC: 12.1 10*3/uL — ABNORMAL HIGH (ref 4.0–10.5)
nRBC: 0 % (ref 0.0–0.2)

## 2021-09-04 LAB — COMPREHENSIVE METABOLIC PANEL
ALT: 69 U/L — ABNORMAL HIGH (ref 0–44)
AST: 64 U/L — ABNORMAL HIGH (ref 15–41)
Albumin: 1.9 g/dL — ABNORMAL LOW (ref 3.5–5.0)
Alkaline Phosphatase: 134 U/L — ABNORMAL HIGH (ref 38–126)
Anion gap: 8 (ref 5–15)
BUN: 5 mg/dL — ABNORMAL LOW (ref 6–20)
CO2: 25 mmol/L (ref 22–32)
Calcium: 8 mg/dL — ABNORMAL LOW (ref 8.9–10.3)
Chloride: 101 mmol/L (ref 98–111)
Creatinine, Ser: 0.82 mg/dL (ref 0.44–1.00)
GFR, Estimated: >60 mL/min (ref >60)
Glucose, Bld: 83 mg/dL (ref 70–99)
Potassium: 4.1 mmol/L (ref 3.5–5.1)
Sodium: 134 mmol/L — ABNORMAL LOW (ref 135–145)
Total Bilirubin: 0.7 mg/dL (ref 0.3–1.2)
Total Protein: 5.4 g/dL — ABNORMAL LOW (ref 6.5–8.1)

## 2021-09-04 LAB — MAGNESIUM: Magnesium: 6.8 mg/dL (ref 1.7–2.4)

## 2021-09-04 MED ORDER — HYDROMORPHONE HCL 1 MG/ML IJ SOLN
1.0000 mg | INTRAMUSCULAR | Status: DC | PRN
  Administered 2021-09-04 (×2): 1 mg via INTRAVENOUS
  Filled 2021-09-04 (×2): qty 1

## 2021-09-04 MED ORDER — SODIUM CHLORIDE 0.9 % IV SOLN
500.0000 mg | Freq: Once | INTRAVENOUS | Status: AC
  Administered 2021-09-04: 10:00:00 500 mg via INTRAVENOUS
  Filled 2021-09-04: qty 25

## 2021-09-04 NOTE — Lactation Note (Signed)
This note was copied from a baby's chart. ?Lactation Consultation Note ? ?Patient Name: Shari Davidson ?Today's Date: 09/04/2021 ?Reason for consult: Follow-up assessment;1st time breastfeeding;Early term 37-38.6wks;Infant weight loss;Breastfeeding assistance;Other (Comment) (1 % weight loss,) ?Age:30 hours ?Mom off the Mag SO4 and up in the room moving about.  ?LC reviewed the doc flow sheets and baby has been breast feeding and formula feeding. Voids and stools QS . Per mom has been pumping with the #27 F and its comfortable. As of yet no results. LC reassured mom especially with B/P issues.  ?Baby awake after assessment and offered to assist to latch on the right breast / football. To start baby was trying to latch by nibbling his way on.  ?LC assisted and worked with mom to latch the baby with depth and baby still feeding at 16 mins with swallows. Mom comfortable. Plans to post pump when she gets finished.  ?Latch score 8  ? ? ?Maternal Data ?Has patient been taught Hand Expression?: Yes ? ?Feeding ?Mother's Current Feeding Choice: Breast Milk and Formula ?Nipple Type: Slow - flow ? ?LATCH Score ?Latch: Repeated attempts needed to sustain latch, nipple held in mouth throughout feeding, stimulation needed to elicit sucking reflex. ? ?Audible Swallowing: Spontaneous and intermittent (increased with brest compressions and warm moist compress in on the breast while feeding -) ? ?Type of Nipple: Everted at rest and after stimulation ? ?Comfort (Breast/Nipple): Soft / non-tender ? ?Hold (Positioning): Assistance needed to correctly position infant at breast and maintain latch. (per mom comfortable) ? ?LATCH Score: 8 ? ? ?Lactation Tools Discussed/Used ?Tools: Pump;Flanges ?Flange Size: 27;Other (comment) (per mom comfortable) ?Breast pump type: Double-Electric Breast Pump ? ?Interventions ?Interventions: Breast feeding basics reviewed;Education;Assisted with latch;Skin to skin;Breast massage;Hand express;Breast  compression;Adjust position;Support pillows;Position options ? ?Discharge ?  ? ?Consult Status ?Consult Status: Follow-up ?Date: 09/05/21 ?Follow-up type: In-patient ? ? ? ?Shari Davidson ?09/04/2021, 4:48 PM ? ? ? ?

## 2021-09-04 NOTE — Progress Notes (Signed)
Postpartum Day 1: Cesarean Delivery  at [redacted]w[redacted]d for NRFHT, severe preeclampsia ?Subjective: ?Patient reports incisional pain and tolerating PO.  Foley still in place. No flatus yet. ?Patient denies any headaches, visual symptoms, RUQ/epigastric pain or other concerning symptoms.  Baby is stable at bedside. ? ?Objective: ?Vital signs in last 24 hours: ?Temp:  [96.8 ?F (36 ?C)-99.2 ?F (37.3 ?C)] 98.6 ?F (37 ?C) (03/09 9030) ?Pulse Rate:  [63-144] 98 (03/09 0748) ?Resp:  [10-25] 14 (03/09 0748) ?BP: (102-147)/(64-137) 126/86 (03/09 0748) ?SpO2:  [95 %-100 %] 96 % (03/09 0748) ?Patient Vitals for the past 24 hrs: ? BP Temp Temp src Pulse Resp SpO2  ?09/04/21 0748 126/86 98.6 ?F (37 ?C) Oral 98 14 96 %  ?09/04/21 0745 -- -- -- -- 14 96 %  ?09/04/21 0553 -- -- -- -- 19 100 %  ?09/04/21 0418 129/85 97.9 ?F (36.6 ?C) Oral 93 16 98 %  ?09/04/21 0137 -- -- -- -- 10 96 %  ?09/03/21 2323 102/78 97.7 ?F (36.5 ?C) Oral 86 16 96 %  ?09/03/21 2115 -- -- -- -- 16 100 %  ?09/03/21 2100 123/86 -- -- 63 -- --  ?09/03/21 2000 123/82 -- -- 76 -- --  ?09/03/21 1857 -- -- -- -- 17 --  ?09/03/21 1747 -- -- -- -- 17 100 %  ?09/03/21 1700 -- -- -- -- 17 --  ?09/03/21 1600 133/82 98 ?F (36.7 ?C) Oral 83 18 98 %  ?09/03/21 1510 -- -- -- -- 17 --  ?09/03/21 1501 136/81 -- -- 80 -- --  ?09/03/21 1411 -- -- -- -- -- 98 %  ?09/03/21 1406 -- -- -- -- -- 96 %  ?09/03/21 1401 129/80 -- -- 80 -- 98 %  ?09/03/21 1400 -- -- -- -- 17 --  ?09/03/21 1356 -- -- -- -- -- 99 %  ?09/03/21 1351 -- -- -- -- -- 98 %  ?09/03/21 1346 -- -- -- -- -- 96 %  ?09/03/21 1341 -- -- -- -- -- 97 %  ?09/03/21 1336 -- -- -- -- -- 96 %  ?09/03/21 1332 131/76 -- -- (!) 107 -- --  ?09/03/21 1331 -- -- -- -- -- 97 %  ?09/03/21 1326 -- -- -- -- -- 97 %  ?09/03/21 1321 -- -- -- -- -- 97 %  ?09/03/21 1316 -- -- -- -- -- 96 %  ?09/03/21 1315 131/69 99.1 ?F (37.3 ?C) Oral 89 17 97 %  ?09/03/21 1210 129/70 99.2 ?F (37.3 ?C) Oral 93 16 96 %  ?09/03/21 1108 (!) 144/93 99.1 ?F (37.3 ?C) Oral  (!) 102 16 99 %  ?09/03/21 1100 (!) 142/88 -- -- 97 18 98 %  ?09/03/21 1059 -- -- -- 99 20 98 %  ?09/03/21 1058 -- -- -- 97 16 97 %  ?09/03/21 1057 -- -- -- (!) 106 17 97 %  ?09/03/21 1056 -- -- -- (!) 108 16 98 %  ?09/03/21 1055 -- -- -- (!) 121 19 97 %  ?09/03/21 1054 -- -- -- (!) 120 19 99 %  ?09/03/21 1053 -- -- -- (!) 114 17 98 %  ?09/03/21 1052 -- -- -- (!) 110 19 99 %  ?09/03/21 1051 -- -- -- (!) 104 16 98 %  ?09/03/21 1050 -- -- -- (!) 113 19 99 %  ?09/03/21 1049 -- -- -- (!) 101 17 99 %  ?09/03/21 1048 -- -- -- (!) 106 18 98 %  ?09/03/21 1047 -- -- -- (!) 104 17 98 %  ?  09/03/21 1046 -- -- -- (!) 103 16 97 %  ?09/03/21 1045 135/86 98.6 ?F (37 ?C) Axillary (!) 106 17 98 %  ?09/03/21 1044 -- -- -- (!) 106 19 98 %  ?09/03/21 1043 -- -- -- (!) 106 18 99 %  ?09/03/21 1042 -- -- -- (!) 111 17 99 %  ?09/03/21 1041 -- -- -- (!) 109 16 98 %  ?09/03/21 1040 -- -- -- (!) 109 19 99 %  ?09/03/21 1039 -- -- -- (!) 114 19 99 %  ?09/03/21 1038 -- -- -- (!) 115 19 99 %  ?09/03/21 1037 -- -- -- (!) 119 (!) 21 99 %  ?09/03/21 1036 (!) 147/78 -- -- (!) 122 (!) 24 98 %  ?09/03/21 1035 -- -- -- (!) 132 20 97 %  ?09/03/21 1034 -- -- -- (!) 135 (!) 23 98 %  ?09/03/21 1033 -- -- -- (!) 132 20 97 %  ?09/03/21 1032 -- -- -- (!) 129 (!) 21 95 %  ?09/03/21 1031 -- -- -- (!) 128 (!) 23 97 %  ?09/03/21 1030 (!) 147/137 -- -- (!) 126 (!) 21 96 %  ?09/03/21 1029 -- -- -- (!) 127 20 97 %  ?09/03/21 1028 -- -- -- (!) 124 (!) 22 97 %  ?09/03/21 1027 -- -- -- (!) 129 20 96 %  ?09/03/21 1026 -- -- -- (!) 128 (!) 21 97 %  ?09/03/21 1025 -- -- -- (!) 131 20 98 %  ?09/03/21 1024 -- -- -- (!) 132 (!) 22 98 %  ?09/03/21 1023 -- -- -- (!) 128 (!) 21 96 %  ?09/03/21 1022 -- -- -- (!) 134 (!) 22 98 %  ?09/03/21 1021 -- -- -- (!) 129 (!) 21 96 %  ?09/03/21 1020 -- -- -- (!) 135 (!) 21 96 %  ?09/03/21 1019 -- -- -- (!) 134 (!) 21 97 %  ?09/03/21 1018 -- -- -- (!) 137 (!) 22 96 %  ?09/03/21 1017 -- -- -- (!) 139 (!) 21 97 %  ?09/03/21 1016 -- -- -- (!) 137  (!) 24 96 %  ?09/03/21 1015 (!) 120/103 (!) 96.8 ?F (36 ?C) Axillary (!) 144 18 97 %  ?09/03/21 1014 -- -- -- (!) 141 (!) 22 98 %  ?09/03/21 1013 -- -- -- (!) 137 (!) 25 98 %  ?09/03/21 1012 -- -- -- (!) 133 (!) 23 98 %  ?09/03/21 1011 -- -- -- (!) 132 20 97 %  ?09/03/21 1010 -- -- -- (!) 133 (!) 24 97 %  ?09/03/21 1009 -- -- -- (!) 133 18 97 %  ?09/03/21 1008 -- -- -- (!) 134 19 97 %  ?09/03/21 1007 -- -- -- (!) 135 (!) 24 99 %  ?09/03/21 1006 -- -- -- (!) 137 (!) 21 99 %  ?09/03/21 1005 -- -- -- (!) 137 (!) 25 99 %  ?09/03/21 1004 -- -- -- (!) 138 (!) 22 99 %  ?09/03/21 1003 -- -- -- (!) 139 20 98 %  ?09/03/21 1000 116/64 -- -- (!) 136 (!) 21 99 %  ?09/03/21 0840 (!) 146/84 -- -- (!) 138 -- --  ?09/03/21 0830 (!) 144/84 -- -- (!) 116 -- --  ? ? ?Physical Exam:  ?General: alert and no distress ?Lochia: appropriate ?Uterine Fundus: firm ?Incision: healing well, no significant drainage, dressing C/D/I ?DVT Evaluation: No evidence of DVT seen on physical exam.  Negative Homan's sign. No cords or calf tenderness. No significant calf/ankle edema. ? ?  CBC Latest Ref Rng & Units 09/04/2021 09/03/2021 09/03/2021  ?WBC 4.0 - 10.5 K/uL 12.1(H) 10.9(H) 11.5(H)  ?Hemoglobin 12.0 - 15.0 g/dL 1.6(X9.6(L) 10.0(L) 8.9(L)  ?Hematocrit 36.0 - 46.0 % 29.4(L) 29.3(L) 27.5(L)  ?Platelets 150 - 400 K/uL 216 201 167  ? ?CMP Latest Ref Rng & Units 09/04/2021 09/03/2021 09/03/2021  ?Glucose 70 - 99 mg/dL 83 98 096(E208(H)  ?BUN 6 - 20 mg/dL 5(L) <4(V<5(L) 7  ?Creatinine 0.44 - 1.00 mg/dL 4.090.82 8.110.78 9.14(N1.06(H)  ?Sodium 135 - 145 mmol/L 134(L) 139 135  ?Potassium 3.5 - 5.1 mmol/L 4.1 4.2 3.9  ?Chloride 98 - 111 mmol/L 101 108 106  ?CO2 22 - 32 mmol/L 25 23 17(L)  ?Calcium 8.9 - 10.3 mg/dL 8.0(L) 8.4(L) 7.9(L)  ?Total Protein 6.5 - 8.1 g/dL 8.2(N5.4(L) 5.2(L) 5.1(L)  ?Total Bilirubin 0.3 - 1.2 mg/dL 0.7 0.5 0.5  ?Alkaline Phos 38 - 126 U/L 134(H) 133(H) 136(H)  ?AST 15 - 41 U/L 64(H) 84(H) 89(H)  ?ALT 0 - 44 U/L 69(H) 79(H) 83(H)  ? ? ?Assessment/Plan: ?Status post Cesarean  section. Doing well postoperatively.  ?LFTs and Cr are trending down. Good UOP, foley to be discontinued ?Continue Labetalol 200 mg po bid and Lasix 20 mg po bid ?PCA discontinued, oral analgesia as needed. ?Venofer ordered for acute blood loss anemia, she did not want oral iron due to history of constipation. ?Breastfeeding ?Continue current care. ? ? ?Jaynie CollinsUgonna Deo Mehringer, MD ?09/04/2021, 7:57 AM ? ? ?

## 2021-09-05 ENCOUNTER — Other Ambulatory Visit (HOSPITAL_COMMUNITY): Payer: Self-pay

## 2021-09-05 LAB — CBC
HCT: 28.7 % — ABNORMAL LOW (ref 36.0–46.0)
Hemoglobin: 9.3 g/dL — ABNORMAL LOW (ref 12.0–15.0)
MCH: 29.7 pg (ref 26.0–34.0)
MCHC: 32.4 g/dL (ref 30.0–36.0)
MCV: 91.7 fL (ref 80.0–100.0)
Platelets: 244 10*3/uL (ref 150–400)
RBC: 3.13 MIL/uL — ABNORMAL LOW (ref 3.87–5.11)
RDW: 13.1 % (ref 11.5–15.5)
WBC: 12.2 10*3/uL — ABNORMAL HIGH (ref 4.0–10.5)
nRBC: 0 % (ref 0.0–0.2)

## 2021-09-05 LAB — COMPREHENSIVE METABOLIC PANEL
ALT: 50 U/L — ABNORMAL HIGH (ref 0–44)
AST: 36 U/L (ref 15–41)
Albumin: 2 g/dL — ABNORMAL LOW (ref 3.5–5.0)
Alkaline Phosphatase: 115 U/L (ref 38–126)
Anion gap: 8 (ref 5–15)
BUN: 12 mg/dL (ref 6–20)
CO2: 26 mmol/L (ref 22–32)
Calcium: 8.7 mg/dL — ABNORMAL LOW (ref 8.9–10.3)
Chloride: 104 mmol/L (ref 98–111)
Creatinine, Ser: 0.83 mg/dL (ref 0.44–1.00)
GFR, Estimated: >60 mL/min (ref >60)
Glucose, Bld: 100 mg/dL — ABNORMAL HIGH (ref 70–99)
Potassium: 4.3 mmol/L (ref 3.5–5.1)
Sodium: 138 mmol/L (ref 135–145)
Total Bilirubin: <0.1 mg/dL — ABNORMAL LOW (ref 0.3–1.2)
Total Protein: 5.4 g/dL — ABNORMAL LOW (ref 6.5–8.1)

## 2021-09-05 MED ORDER — LABETALOL HCL 100 MG PO TABS
100.0000 mg | ORAL_TABLET | Freq: Two times a day (BID) | ORAL | 1 refills | Status: DC
  Filled 2021-09-05: qty 42, 21d supply, fill #0

## 2021-09-05 MED ORDER — FUROSEMIDE 40 MG PO TABS
20.0000 mg | ORAL_TABLET | Freq: Two times a day (BID) | ORAL | Status: DC
  Administered 2021-09-05: 20 mg via ORAL
  Filled 2021-09-05: qty 1

## 2021-09-05 MED ORDER — SIMETHICONE 80 MG PO CHEW
80.0000 mg | CHEWABLE_TABLET | Freq: Four times a day (QID) | ORAL | 0 refills | Status: DC | PRN
  Filled 2021-09-05: qty 30, 8d supply, fill #0

## 2021-09-05 MED ORDER — FUROSEMIDE 20 MG PO TABS
20.0000 mg | ORAL_TABLET | Freq: Two times a day (BID) | ORAL | 0 refills | Status: DC
  Filled 2021-09-05: qty 4, 2d supply, fill #0

## 2021-09-05 MED ORDER — LABETALOL HCL 100 MG PO TABS
100.0000 mg | ORAL_TABLET | Freq: Two times a day (BID) | ORAL | Status: DC
  Administered 2021-09-05: 100 mg via ORAL
  Filled 2021-09-05: qty 1

## 2021-09-05 MED ORDER — OXYCODONE-ACETAMINOPHEN 5-325 MG PO TABS
1.0000 | ORAL_TABLET | Freq: Four times a day (QID) | ORAL | 0 refills | Status: DC | PRN
Start: 2021-09-05 — End: 2023-09-13
  Filled 2021-09-05: qty 30, 4d supply, fill #0

## 2021-09-05 NOTE — Progress Notes (Signed)
Pt given discharge instructions. Pt verbalized understanding. All questions answered. Pt currently awaiting for her medications to be delivered by pharmacy.  ?

## 2021-09-05 NOTE — Progress Notes (Signed)
Meds delivered to patient by pharmacy.  ?

## 2021-09-10 ENCOUNTER — Encounter: Payer: Medicaid Other | Admitting: Family Medicine

## 2021-09-10 ENCOUNTER — Ambulatory Visit: Payer: Self-pay

## 2021-09-13 ENCOUNTER — Telehealth (HOSPITAL_COMMUNITY): Payer: Self-pay

## 2021-09-13 NOTE — Telephone Encounter (Signed)
"  I'm doing fine. I have a doctor appointment coming up on April 5th. My incision is still sore and has some swelling." RN reviewed incision care and signs of infection. RN told patient to call OB about swelling at incision.  ? ?"She's doing fine, doing ok. I switched to formula. I was breastfeeding. She is doing well, pretty healthy. She sleeps in a bassinet." RN reviewed ABC's of safe sleep with patient. Patient declines any questions or concerns about baby. ? ?EPDS score is 0. ? ?Marcelino Duster Garet Hooton,RN3,MSN,RNC-MNN ?09/13/2021,1534 ?

## 2021-09-17 ENCOUNTER — Encounter: Payer: Self-pay | Admitting: Family Medicine

## 2021-09-22 ENCOUNTER — Telehealth: Payer: Self-pay

## 2021-09-22 ENCOUNTER — Ambulatory Visit: Payer: Medicaid Other

## 2021-09-22 NOTE — Telephone Encounter (Signed)
Called patient to follow up on missed appt for BP and incision check. VM left stating pt may return call to reschedule. MyChart message sent.  ?

## 2021-09-24 ENCOUNTER — Encounter: Payer: Self-pay | Admitting: Family Medicine

## 2021-10-01 ENCOUNTER — Ambulatory Visit: Payer: Self-pay | Admitting: Obstetrics and Gynecology

## 2021-10-17 ENCOUNTER — Ambulatory Visit: Payer: Medicaid Other | Admitting: Obstetrics and Gynecology

## 2021-10-17 ENCOUNTER — Encounter: Payer: Self-pay | Admitting: Obstetrics and Gynecology

## 2021-10-17 DIAGNOSIS — Z3480 Encounter for supervision of other normal pregnancy, unspecified trimester: Secondary | ICD-10-CM

## 2021-10-17 DIAGNOSIS — O34219 Maternal care for unspecified type scar from previous cesarean delivery: Secondary | ICD-10-CM

## 2021-10-19 NOTE — Progress Notes (Signed)
Patient did not keep her postpartum appointment for 10/17/21. ? ?Shari Copa MD ?Attending ?[facility] [employer] [facility]) ? ?

## 2021-12-02 ENCOUNTER — Encounter: Payer: Self-pay | Admitting: *Deleted

## 2022-09-05 IMAGING — US US OB < 14 WEEKS - US OB TV
1 series · 15 of 28 positions shown · non-contrast
Comparison: None this pregnancy

CLINICAL DATA: Pregnant patient in first-trimester pregnancy with
left lower quadrant pain.

EXAM:
OBSTETRIC <14 WK US AND TRANSVAGINAL OB US
TECHNIQUE: Both transabdominal and transvaginal ultrasound examinations were
performed for complete evaluation of the gestation as well as the
maternal uterus, adnexal regions, and pelvic cul-de-sac.
Transvaginal technique was performed to assess early pregnancy.

[Series 1: us ob < 14 weeks - us ob tv · 15 of 67 slices shown]
[im 1/67]
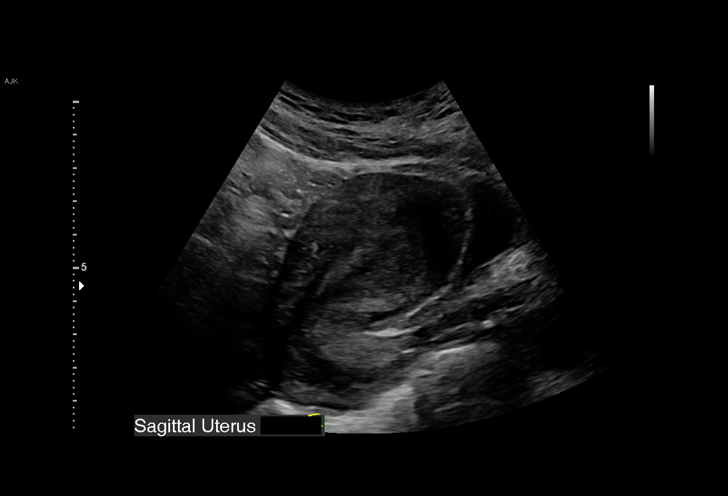
[im 5/67]
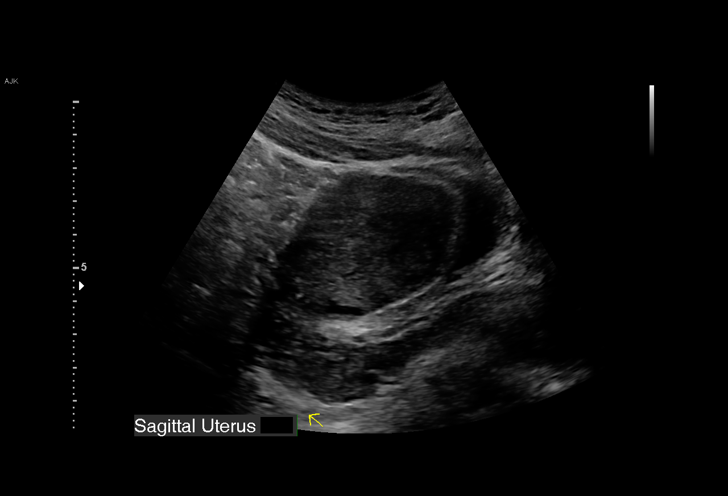
[im 10/67]
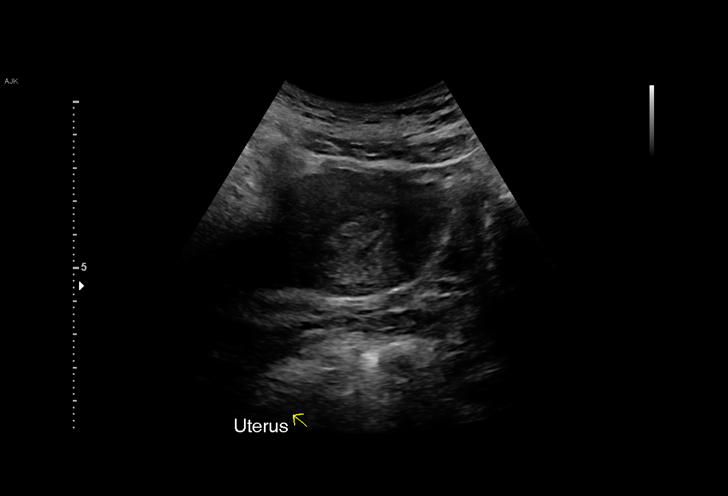
[im 15/67]
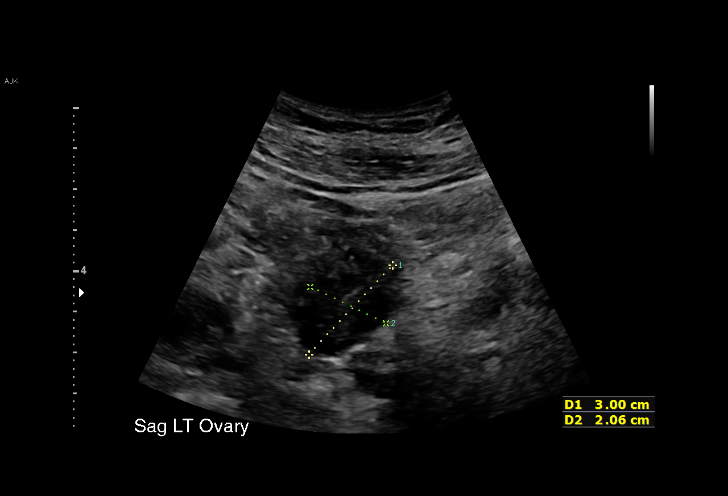
[im 20/67]
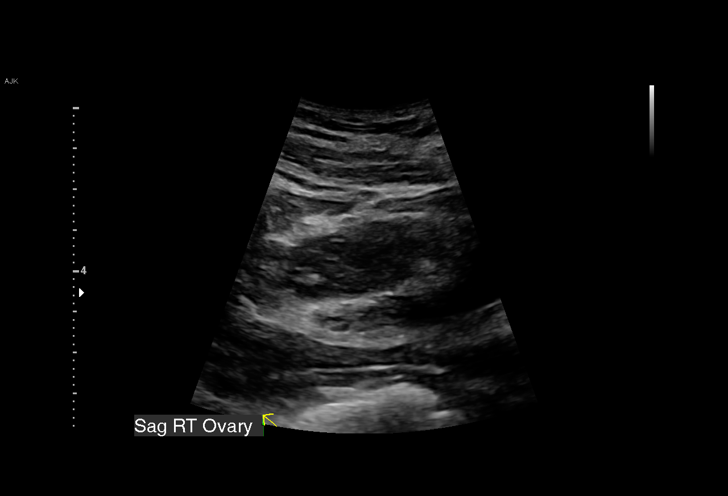
[im 25/67]
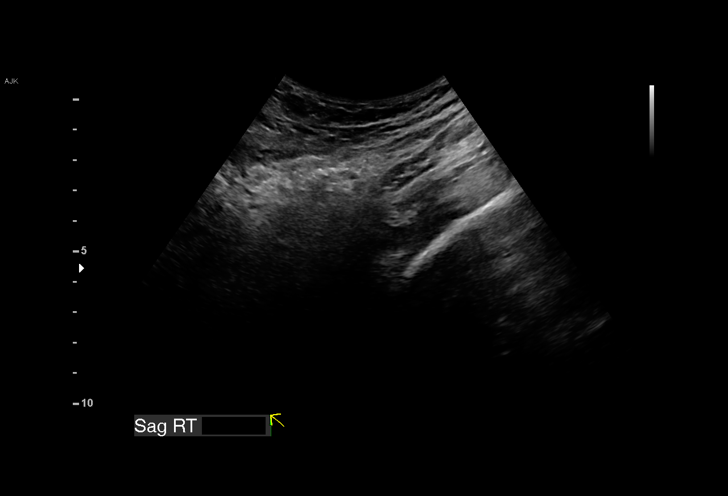
[im 30/67]
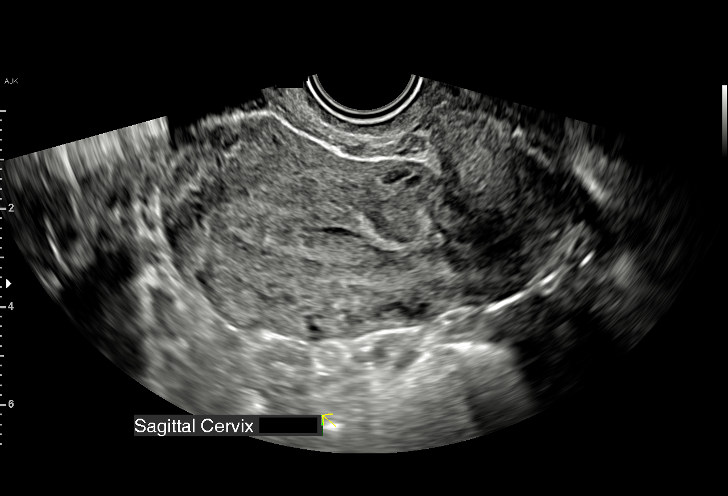
[im 35/67]
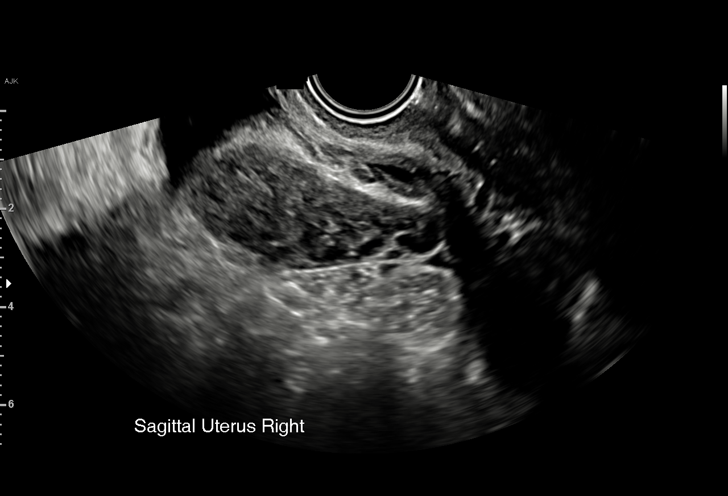
[im 37/67]
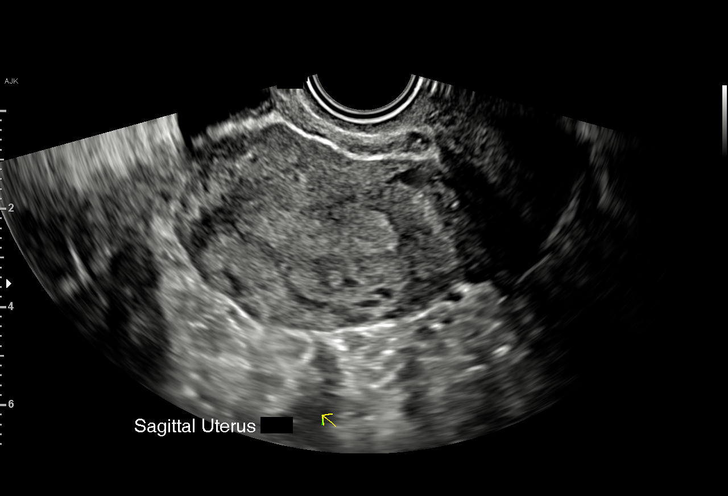
[im 42/67]
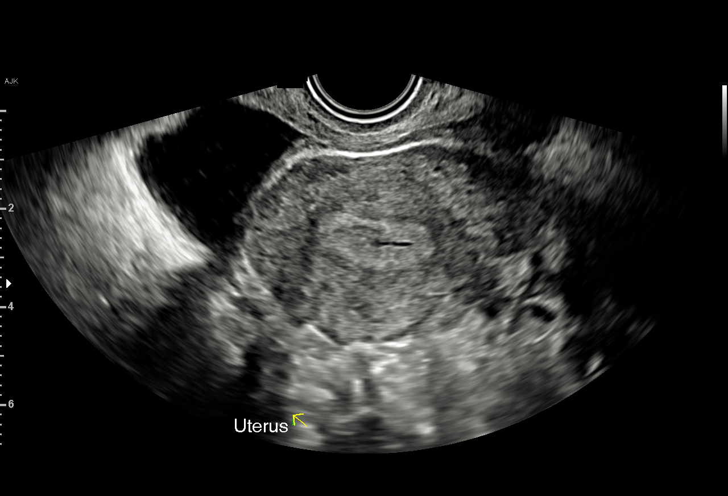
[im 47/67]
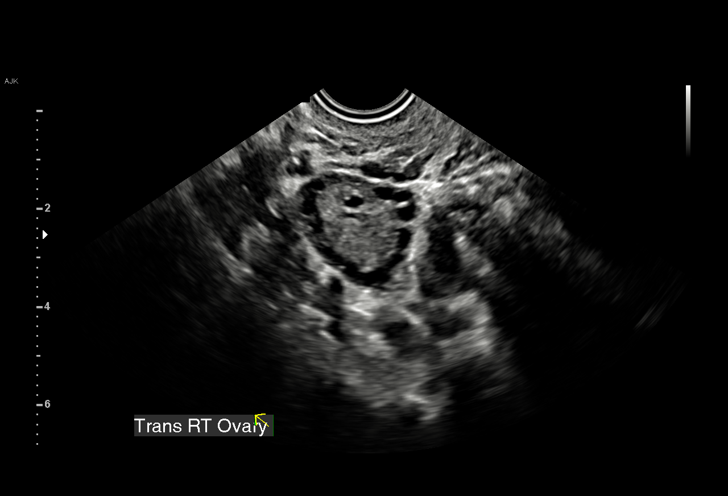
[im 52/67]
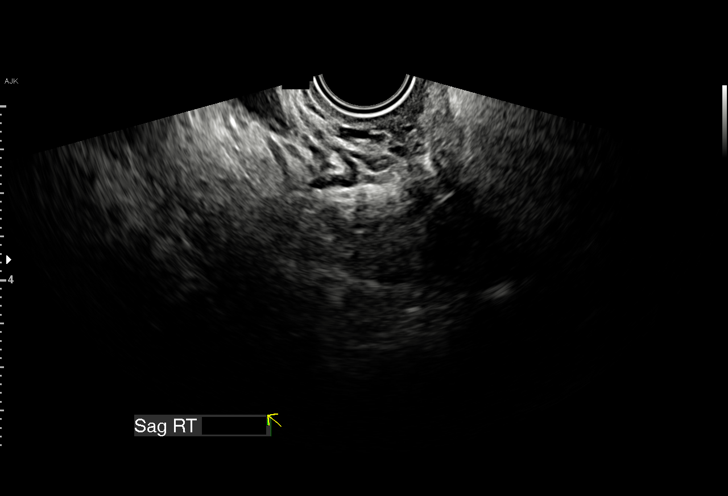
[im 57/67]
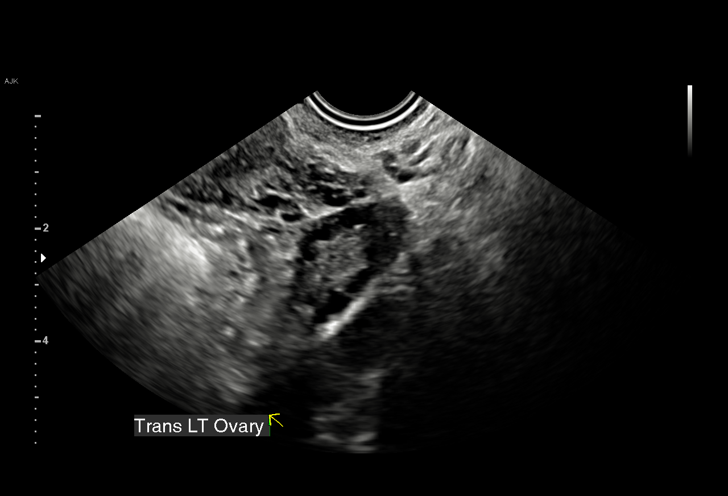
[im 62/67]
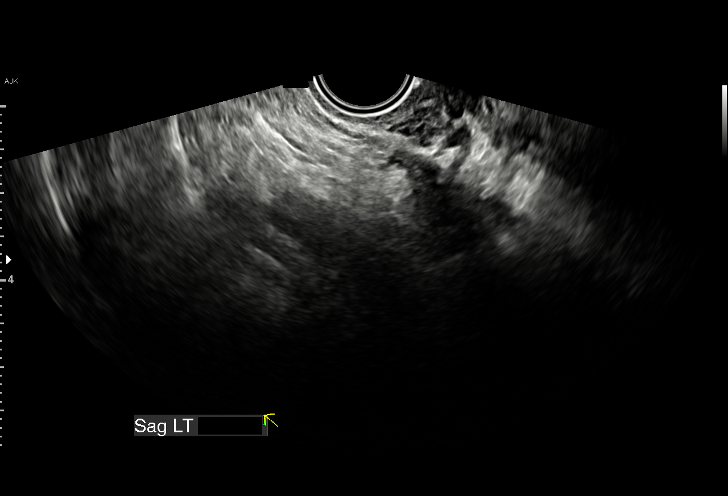
[im 67/67]
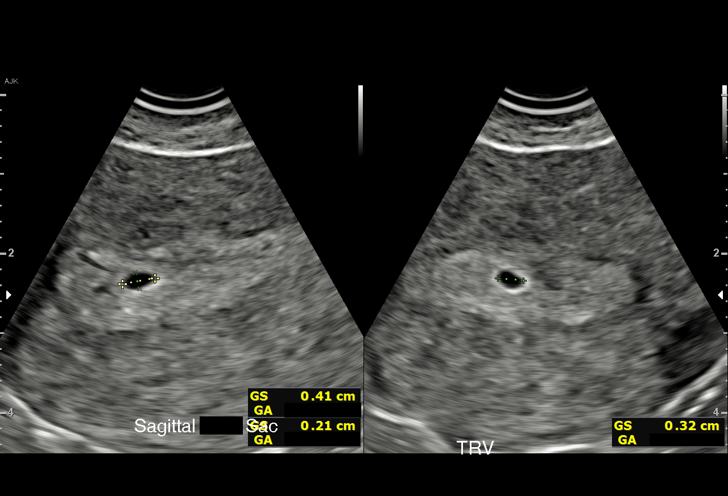

[15 of 28 positions shown; findings below may reference images not displayed]

FINDINGS: Intrauterine gestational sac: Single

Yolk sac:  Not Visualized.

Embryo:  Not Visualized.

Cardiac Activity: Not Visualized.

MSD: 3 mm   5 w   0 d

Subchorionic hemorrhage:  None visualized.

Maternal uterus/adnexae: Probable early intrauterine gestational
sac. There is trace free fluid in the endometrial canal but does not
appear to be subchorionic hemorrhage. There is a corpus luteal cyst
in the right ovary. The left ovary is normal. No pelvic free fluid
or adnexal mass.
IMPRESSION: Probable early intrauterine gestational sac, but no yolk sac, fetal
pole, or cardiac activity yet visualized. Recommend follow-up
quantitative B-HCG levels and follow-up US in 14 days to assess
viability. This recommendation follows SRU consensus guidelines:
Diagnostic Criteria for Nonviable Pregnancy Early in the First
Trimester. N Engl J Med 6035; [DATE].

## 2023-01-22 IMAGING — US US OB COMP LESS 14 WK
1 series · 15 of 26 positions shown · non-contrast
Comparison: None.

CLINICAL DATA: Left lower quadrant pain, quantitative beta hCG
pending

EXAM:
OBSTETRIC <14 WK ULTRASOUND
TECHNIQUE: Transabdominal ultrasound was performed for evaluation of the
gestation as well as the maternal uterus and adnexal regions.

[Series 1: us ob comp less 14 wk · 15 of 26 slices shown]
[im 1/26]
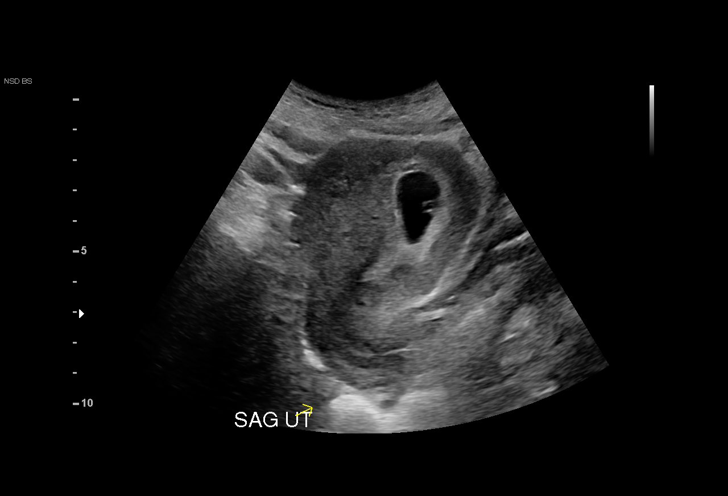
[im 3/26]
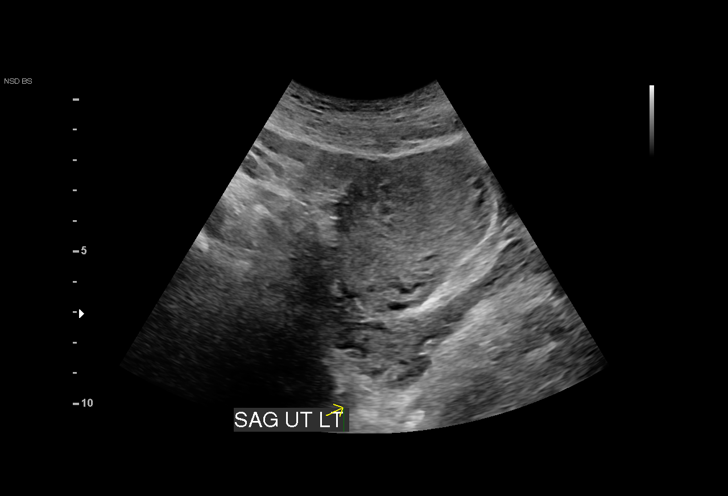
[im 5/26]
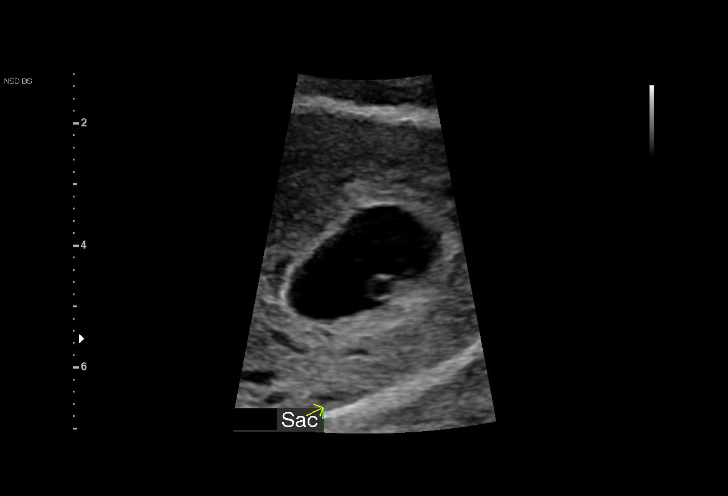
[im 7/26]
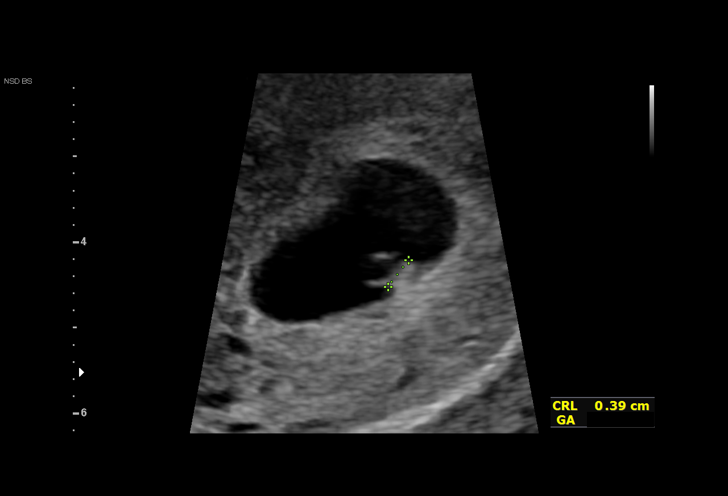
[im 8/26]
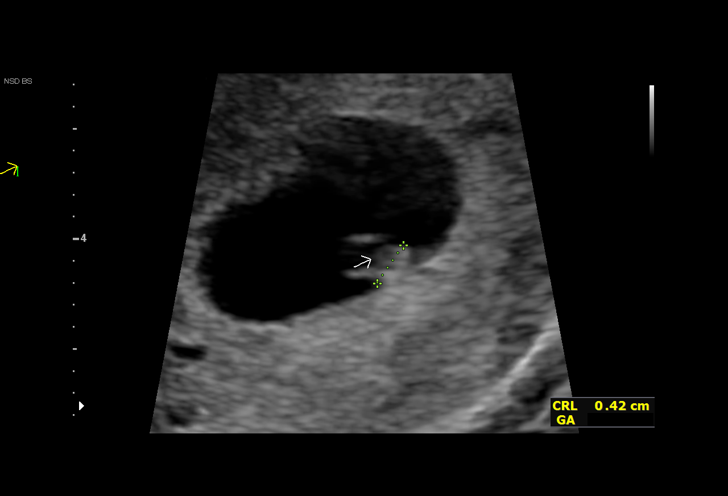
[im 10/26]
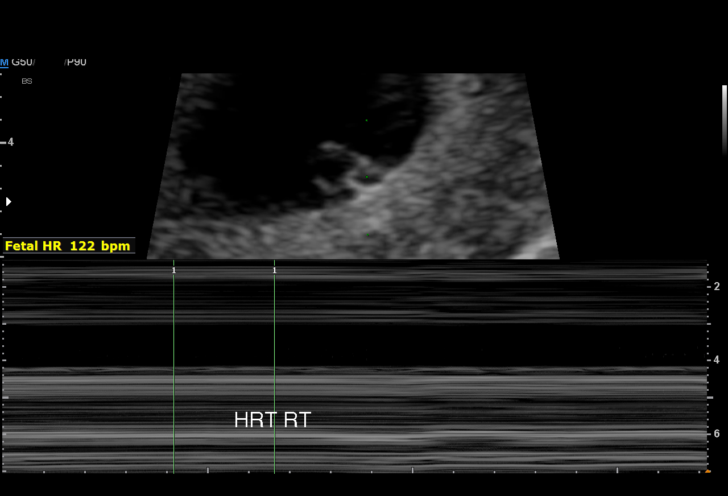
[im 12/26]
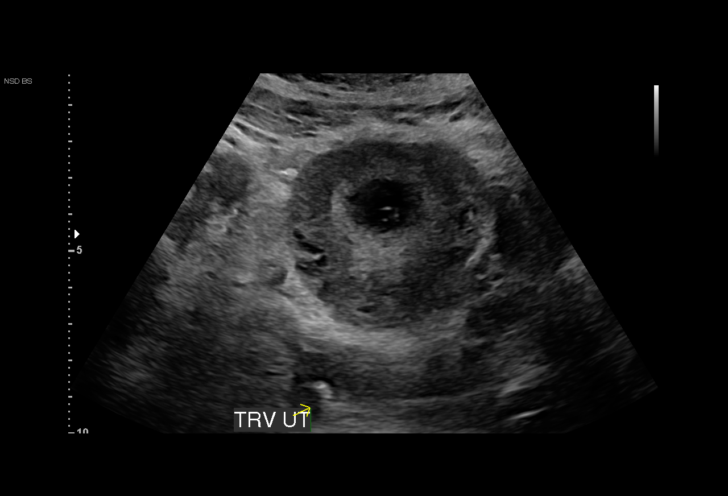
[im 14/26]
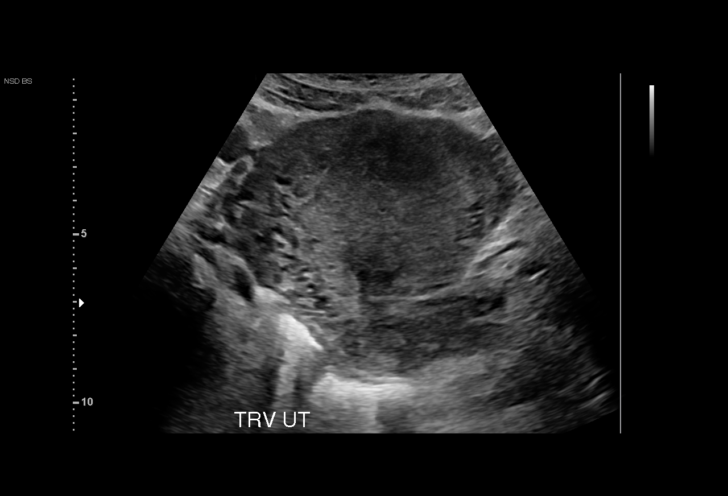
[im 15/26]
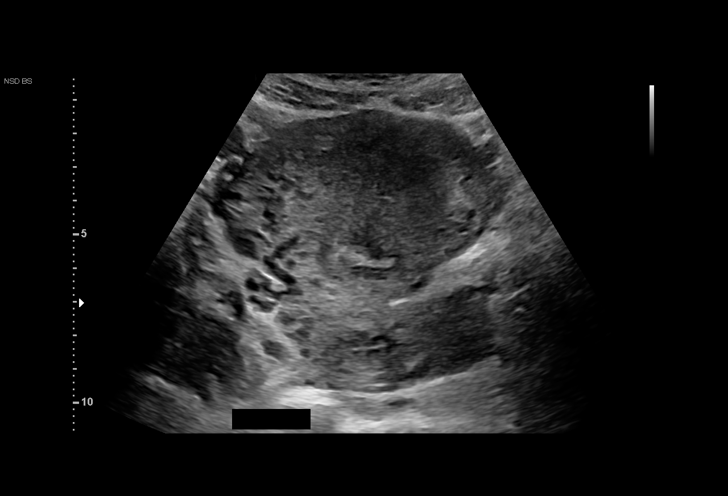
[im 17/26]
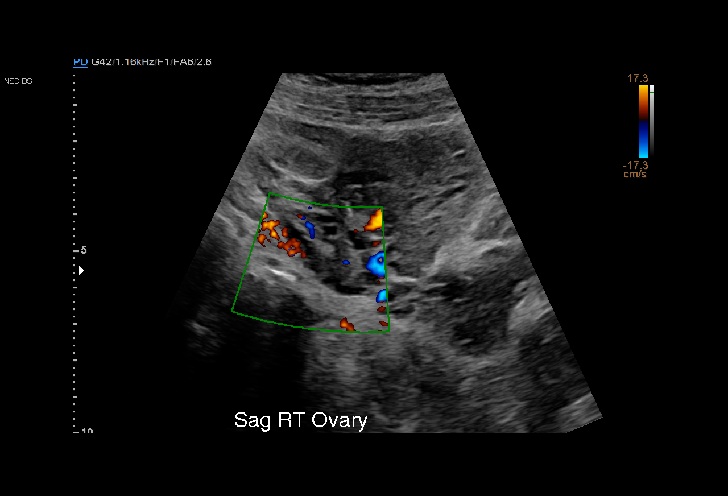
[im 19/26]
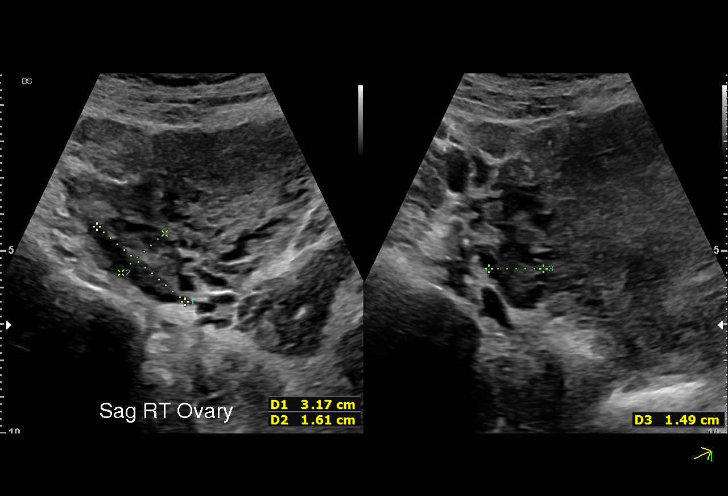
[im 20/26]
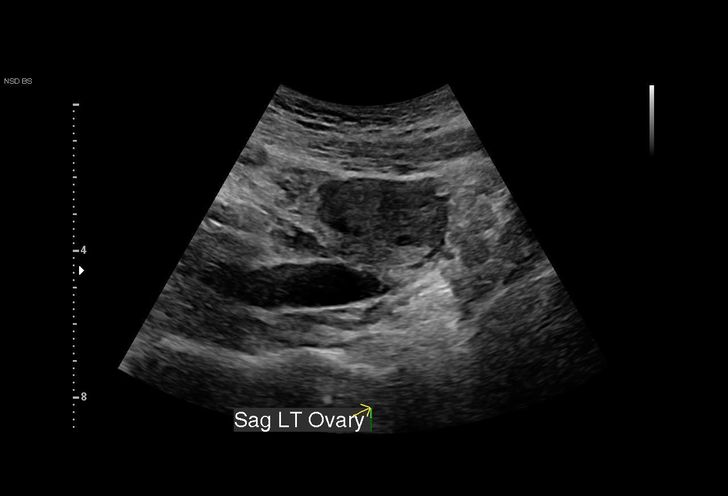
[im 22/26]
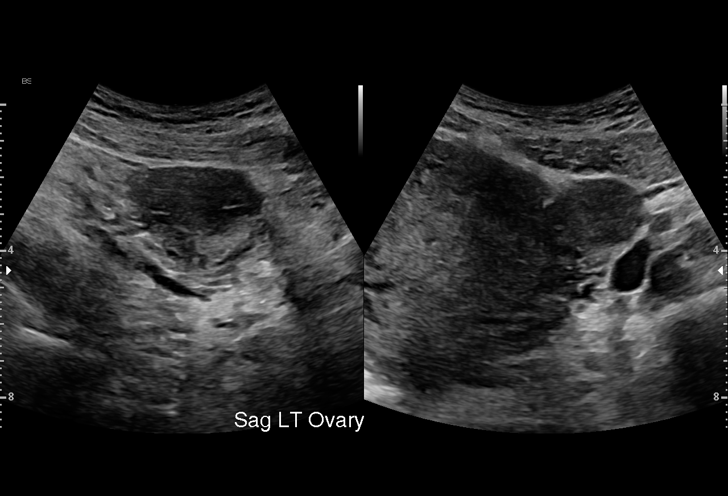
[im 24/26]
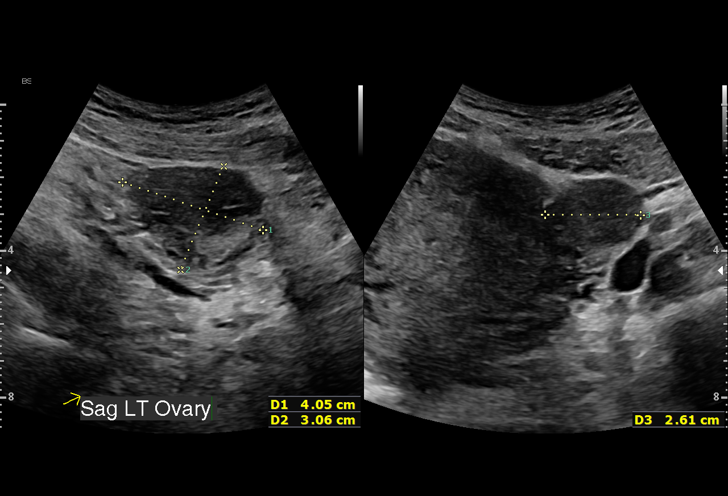
[im 26/26]
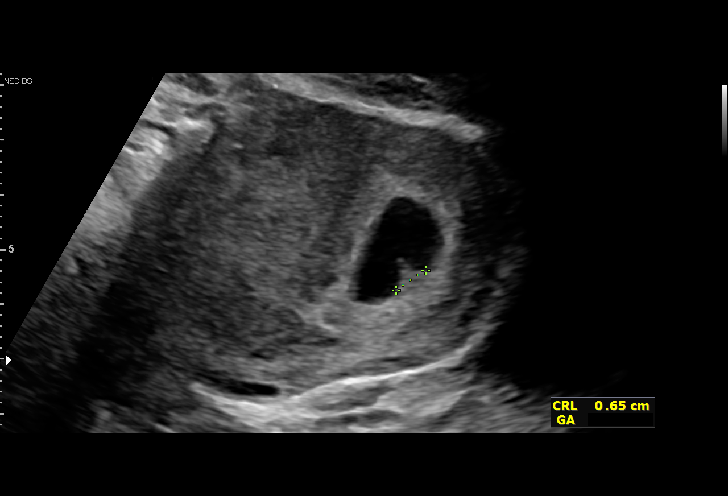

[15 of 26 positions shown; findings below may reference images not displayed]

FINDINGS: Intrauterine gestational sac: Single

Yolk sac:  Visualized.

Embryo:  Visualized.

Cardiac Activity: Visualized.

Heart Rate: 122 bpm

CRL:   6.8 mm   6 w 3 d                  US EDC: 09/11/2021

Subchorionic hemorrhage:  None visualized.

Maternal uterus/adnexae: Anteverted and slightly anteflexed maternal
uterus. No concerning uterine abnormality. Normal appearance of the
ovaries with a probable left corpus luteum. No free fluid.
IMPRESSION: Single intrauterine gestation at 6 weeks, 3 days by crown-rump
length sonographic estimation. No acute sonographic complication.

## 2023-05-20 ENCOUNTER — Ambulatory Visit: Payer: Medicaid Other | Admitting: Family Medicine

## 2023-09-13 ENCOUNTER — Encounter (HOSPITAL_COMMUNITY): Payer: Self-pay | Admitting: Obstetrics and Gynecology

## 2023-09-13 ENCOUNTER — Inpatient Hospital Stay (HOSPITAL_COMMUNITY)
Admission: AD | Admit: 2023-09-13 | Discharge: 2023-09-13 | Disposition: A | Discharge status: Home / Self Care | Attending: Obstetrics and Gynecology | Admitting: Obstetrics and Gynecology

## 2023-09-13 ENCOUNTER — Inpatient Hospital Stay (HOSPITAL_COMMUNITY)

## 2023-09-13 DIAGNOSIS — O3680X0 Pregnancy with inconclusive fetal viability, not applicable or unspecified: Secondary | ICD-10-CM | POA: Diagnosis not present

## 2023-09-13 DIAGNOSIS — R110 Nausea: Secondary | ICD-10-CM | POA: Diagnosis not present

## 2023-09-13 DIAGNOSIS — O26899 Other specified pregnancy related conditions, unspecified trimester: Secondary | ICD-10-CM

## 2023-09-13 DIAGNOSIS — M549 Dorsalgia, unspecified: Secondary | ICD-10-CM | POA: Diagnosis not present

## 2023-09-13 DIAGNOSIS — R109 Unspecified abdominal pain: Secondary | ICD-10-CM | POA: Diagnosis not present

## 2023-09-13 DIAGNOSIS — O26891 Other specified pregnancy related conditions, first trimester: Secondary | ICD-10-CM | POA: Diagnosis present

## 2023-09-13 DIAGNOSIS — Z3A01 Less than 8 weeks gestation of pregnancy: Secondary | ICD-10-CM | POA: Diagnosis not present

## 2023-09-13 DIAGNOSIS — O219 Vomiting of pregnancy, unspecified: Secondary | ICD-10-CM

## 2023-09-13 LAB — URINALYSIS, ROUTINE W REFLEX MICROSCOPIC
Bilirubin Urine: NEGATIVE
Glucose, UA: NEGATIVE mg/dL
Hgb urine dipstick: NEGATIVE
Ketones, ur: NEGATIVE mg/dL
Leukocytes,Ua: NEGATIVE
Nitrite: NEGATIVE
Protein, ur: NEGATIVE mg/dL
Specific Gravity, Urine: 1.01 (ref 1.005–1.030)
pH: 7 (ref 5.0–8.0)

## 2023-09-13 LAB — CBC
HCT: 32.2 % — ABNORMAL LOW (ref 36.0–46.0)
Hemoglobin: 11 g/dL — ABNORMAL LOW (ref 12.0–15.0)
MCH: 30.6 pg (ref 26.0–34.0)
MCHC: 34.2 g/dL (ref 30.0–36.0)
MCV: 89.7 fL (ref 80.0–100.0)
Platelets: 257 10*3/uL (ref 150–400)
RBC: 3.59 MIL/uL — ABNORMAL LOW (ref 3.87–5.11)
RDW: 11.9 % (ref 11.5–15.5)
WBC: 5 10*3/uL (ref 4.0–10.5)
nRBC: 0 % (ref 0.0–0.2)

## 2023-09-13 LAB — WET PREP, GENITAL
Clue Cells Wet Prep HPF POC: NONE SEEN
Sperm: NONE SEEN
Trich, Wet Prep: NONE SEEN
WBC, Wet Prep HPF POC: <10 (ref <10)
Yeast Wet Prep HPF POC: NONE SEEN

## 2023-09-13 LAB — ABO/RH: ABO/RH(D): O POS

## 2023-09-13 LAB — POCT PREGNANCY, URINE: Preg Test, Ur: POSITIVE — AB

## 2023-09-13 LAB — HCG, QUANTITATIVE, PREGNANCY: hCG, Beta Chain, Quant, S: 83044 m[IU]/mL — ABNORMAL HIGH (ref <5)

## 2023-09-13 MED ORDER — DICLEGIS 10-10 MG PO TBEC: 2.0000 | DELAYED_RELEASE_TABLET | Freq: Every evening | ORAL | 5 refills | Status: DC | PRN

## 2023-09-13 MED ORDER — ONDANSETRON 4 MG PO TBDP: 4.0000 mg | ORAL_TABLET | Freq: Four times a day (QID) | ORAL | 0 refills | Status: DC | PRN

## 2023-09-13 NOTE — MAU Provider Note (Signed)
 Chief Complaint: Abdominal Pain and Back Pain   Event Date/Time   First Provider Initiated Contact with Patient 09/13/23 2306        SUBJECTIVE HPI: Shari Davidson is a 33 y.o. [id number] at [redacted]w[redacted]d by LMP who presents to maternity admissions reporting cramps on sides.  Has some nausea, no vomiting.. She denies vaginal bleeding, urinary symptoms, h/a, or fever/chills.    Abdominal Pain The problem occurs intermittently. The problem has been unchanged. The quality of the pain is cramping. The abdominal pain does not radiate. Pertinent negatives include no diarrhea or dysuria. Nothing aggravates the pain. The pain is relieved by Nothing. She has tried nothing for the symptoms.   RN Note: Pt says positive HPT- on 09-06-2023 LMP- 08-02-2023 She feels cramps- in lower sides and back. 8/10 Feels nausea- no vomiting   Past Medical History:  Diagnosis Date   Asthma    Chlamydia 08/06/11   treated at Urgent Care   Gonorrhea 04/20/12   treated at Urgent Care   Migraine    Myopia 07/08/2007   Qualifier: Diagnosis of  By: Humberto Seals NP, Jaquita Rector contact, treated    Past Surgical History:  Procedure Laterality Date   CESAREAN SECTION  12/01/2019   Procedure: CESAREAN SECTION;  Surgeon: Malachy Chamber, MD;  Location: [facility] LD ORS;  Service: Obstetrics;;   CESAREAN SECTION N/A 09/03/2021   Procedure: CESAREAN SECTION;  Surgeon: Kathrynn Running, MD;  Location: [facility] LD ORS;  Service: Obstetrics;  Laterality: N/A;   Social History   Socioeconomic History   Marital status: Single    Spouse name: Not on file   Number of children: Not on file   Years of education: Not on file   Highest education level: High school graduate  Occupational History   Not on file  Tobacco Use   Smoking status: Former    Current packs/day: 0.00    Types: Cigarettes    Quit date: 08/30/2014    Years since quitting: 9.0   Smokeless tobacco: Never  Vaping Use   Vaping status: Never Used  Substance and  Sexual Activity   Alcohol use: Not Currently    Comment: OCCASIONAL   Drug use: No   Sexual activity: Yes    Birth control/protection: None  Other Topics Concern   Not on file  Social History Narrative    Lives with mother and 2 sisters   Social Drivers of Corporate investment banker Strain: Low Risk  (05/06/2019)   Overall Financial Resource Strain (CARDIA)    Difficulty of Paying Living Expenses: Not hard at all  Food Insecurity: No Food Insecurity (07/07/2021)   Hunger Vital Sign    Worried About Running Out of Food in the Last Year: Never true    Ran Out of Food in the Last Year: Never true  Transportation Needs: No Transportation Needs (07/07/2021)   PRAPARE - Administrator, Civil Service (Medical): No    Lack of Transportation (Non-Medical): No  Physical Activity: Inactive (05/22/2019)   Exercise Vital Sign    Days of Exercise per Week: 0 days    Minutes of Exercise per Session: 0 min  Stress: No Stress Concern Present (05/06/2019)   [employer] of Occupational Health - Occupational Stress Questionnaire    Feeling of Stress : Not at all  Social Connections: Moderately Isolated (05/22/2019)   Social Connection and Isolation Panel [NHANES]    Frequency of Communication with Friends and Family:  More than three times a week    Frequency of Social Gatherings with Friends and Family: Once a week    Attends Religious Services: 1 to 4 times per year    Active Member of [employer] or Organizations: No    Attends Banker Meetings: Never    Marital Status: Never married  Intimate Partner Violence: Not At Risk (05/06/2019)   Humiliation, Afraid, Rape, and Kick questionnaire    Fear of Current or Ex-Partner: No    Emotionally Abused: No    Physically Abused: No    Sexually Abused: No   No current facility-administered medications on file prior to encounter.   Current Outpatient Medications on File Prior to Encounter  Medication Sig Dispense Refill    albuterol (PROVENTIL HFA) 108 (90 Base) MCG/ACT inhaler Inhale 2 puffs into the lungs every 4 (four) hours as needed for shortness of breath or wheezing. for shortness of breath. 18 g 1   famotidine (PEPCID) 20 MG tablet Take 1 tablet (20 mg total) by mouth daily. Can increase to twice daily if need. For heartburn. 60 tablet 3   furosemide (LASIX) 20 MG tablet Take 1 tablet (20 mg total) by mouth 2 (two) times daily for 2 days. 4 tablet 0   labetalol (NORMODYNE) 100 MG tablet Take 1 tablet (100 mg total) by mouth 2 (two) times daily. 42 tablet 1   oxyCODONE-acetaminophen (PERCOCET/ROXICET) 5-325 MG tablet Take 1-2 tablets by mouth every 6 (six) hours as needed. 30 tablet 0   Prenatal Multivit-Min-FA ([employer] PRENATAL GUMMY) 0.4 MG CHEW Chew 1 each by mouth at bedtime. 30 tablet 11   simethicone (MYLICON) 80 MG chewable tablet Chew 1 tablet (80 mg total) by mouth 4 (four) times daily as needed for flatulence. 30 tablet 0   No Known Allergies  I have reviewed patient's Past Medical Hx, Surgical Hx, Family Hx, Social Hx, medications and allergies.   ROS:  Review of Systems  Gastrointestinal:  Positive for abdominal pain. Negative for diarrhea.  Genitourinary:  Negative for dysuria.   Review of Systems  Other systems negative   Physical Exam  Physical Exam Patient Vitals for the past 24 hrs:  BP Temp Temp src Pulse Resp Height Weight  09/13/23 2142 (!) 107/56 98.9 F (37.2 C) Oral 73 14 5\' 4"  (1.626 m) 63.8 kg   Constitutional: Well-developed, well-nourished female in no acute distress.  Cardiovascular: normal rate Respiratory: normal effort GI: Abd soft, non-tender.  MS: Extremities nontender, no edema, normal ROM Neurologic: Alert and oriented x 4.  GU: Neg CVAT.  PELVIC EXAM: deferred in lieu of ultrasound  LAB RESULTS Results for orders placed or performed during the hospital encounter of 09/13/23 (from the past 24 hours)  Wet prep, genital     Status: None   Collection Time:  09/13/23  9:47 PM  Result Value Ref Range   Yeast Wet Prep HPF POC NONE SEEN NONE SEEN   Trich, Wet Prep NONE SEEN NONE SEEN   Clue Cells Wet Prep HPF POC NONE SEEN NONE SEEN   WBC, Wet Prep HPF POC <10 <10   Sperm NONE SEEN   Pregnancy, urine POC     Status: Abnormal   Collection Time: 09/13/23  9:52 PM  Result Value Ref Range   Preg Test, Ur POSITIVE (A) NEGATIVE  Urinalysis, Routine w reflex microscopic -Urine, Clean Catch     Status: Abnormal   Collection Time: 09/13/23  9:53 PM  Result Value Ref Range  Color, Urine STRAW (A) YELLOW   APPearance CLEAR CLEAR   Specific Gravity, Urine 1.010 1.005 - 1.030   pH 7.0 5.0 - 8.0   Glucose, UA NEGATIVE NEGATIVE mg/dL   Hgb urine dipstick NEGATIVE NEGATIVE   Bilirubin Urine NEGATIVE NEGATIVE   Ketones, ur NEGATIVE NEGATIVE mg/dL   Protein, ur NEGATIVE NEGATIVE mg/dL   Nitrite NEGATIVE NEGATIVE   Leukocytes,Ua NEGATIVE NEGATIVE  CBC     Status: Abnormal   Collection Time: 09/13/23 10:09 PM  Result Value Ref Range   WBC 5.0 4.0 - 10.5 K/uL   RBC 3.59 (L) 3.87 - 5.11 MIL/uL   Hemoglobin 11.0 (L) 12.0 - 15.0 g/dL   HCT 72.5 (L) 36.6 - 44.0 %   MCV 89.7 80.0 - 100.0 fL   MCH 30.6 26.0 - 34.0 pg   MCHC 34.2 30.0 - 36.0 g/dL   RDW 34.7 42.5 - 95.6 %   Platelets 257 150 - 400 K/uL   nRBC 0.0 0.0 - 0.2 %  hCG, quantitative, pregnancy     Status: Abnormal   Collection Time: 09/13/23 10:09 PM  Result Value Ref Range   hCG, Beta Chain, Quant, S 83,044 (H) <5 mIU/mL  ABO/Rh     Status: None   Collection Time: 09/13/23 10:09 PM  Result Value Ref Range   ABO/RH(D) O POS    No rh immune globuloin      NOT A RH IMMUNE GLOBULIN CANDIDATE, PT RH POSITIVE Performed at [facility], [address] [place], [place] [address]   HIV Antibody (routine testing w rflx)     Status: None   Collection Time: 09/13/23 10:09 PM  Result Value Ref Range   HIV Screen 4th Generation wRfx Non Reactive Non Reactive     --/--/O POS (03/17  2209)  IMAGING US OB LESS THAN 14 WEEKS WITH OB TRANSVAGINAL Result Date: 09/13/2023 CLINICAL DATA:  Cramping for 7 days with pregnancy. EXAM: OBSTETRIC <14 WK ULTRASOUND TECHNIQUE: Transabdominal ultrasound was performed for evaluation of the gestation as well as the maternal uterus and adnexal regions. COMPARISON:  None Available. FINDINGS: Intrauterine gestational sac: Single Yolk sac:  Yes Embryo:  Yes Cardiac Activity: Yes Heart Rate: 120 bpm CRL:   4.9 mm   6 w 1 d                  [place] EDC: 05/07/2024 Subchorionic hemorrhage:  There is a small subchorionic hemorrhage. Maternal uterus/adnexae: A corpus luteal cyst is noted on the right. The left ovary is within normal limits. No free fluid in the pelvis. IMPRESSION: 1. Single live intrauterine pregnancy with estimated gestational age of [redacted] weeks 1 day. EDC: 05/07/2024. 2. Small subchorionic hemorrhage. Electronically Signed   By: Thornell Sartorius M.D.   On: 09/13/2023 22:48    MAU Management/MDM: I have reviewed the triage vital signs and the nursing notes.   Pertinent labs & imaging results that were available during my care of the patient were reviewed by me and considered in my medical decision making (see chart for details).      I have reviewed her medical records including past results, notes and treatments. Medical, Surgical, and family history were reviewed.  Medications and recent lab tests were reviewed  Ordered usual first trimester r/o ectopic labs.   Pelvic cultures done Will check baseline Ultrasound to rule out ectopic.  This bleeding/pain can represent a normal pregnancy with bleeding, spontaneous abortion or even an ectopic which can be life-threatening.  The process as listed above helps to determine which of these is present.  Reviewed findings of live single IUP    ASSESSMENT Single IUP at [redacted]w[redacted]d Cramping in pregnancy Pregnancy of unknown location, now known to be IUP Nausea   PLAN Discharge home Rx Diclegis for  nausea Rx Zofran for nausea if above does not work Advised to start prenatal care  Pt stable at time of discharge. Encouraged to return here if she develops worsening of symptoms, increase in pain, fever, or other concerning symptoms.    Wynelle Bourgeois CNM, MSN Certified Nurse-Midwife 09/13/2023  11:06 PM

## 2023-09-13 NOTE — MAU Note (Signed)
 Pt says positive HPT- on 09-06-2023 LMP- 08-02-2023 She feels cramps- in lower sides and back. 8/10 Feels nausea- no vomiting

## 2023-09-14 LAB — GC/CHLAMYDIA PROBE AMP (~~LOC~~) NOT AT ARMC
Chlamydia: NEGATIVE
Comment: NEGATIVE
Comment: NORMAL
Neisseria Gonorrhea: NEGATIVE

## 2023-09-14 LAB — HIV ANTIBODY (ROUTINE TESTING W REFLEX): HIV Screen 4th Generation wRfx: NONREACTIVE

## 2023-09-21 ENCOUNTER — Inpatient Hospital Stay (HOSPITAL_COMMUNITY)
Admission: AD | Admit: 2023-09-21 | Discharge: 2023-09-21 | Disposition: A | Discharge status: Home / Self Care | Payer: Self-pay | Attending: Obstetrics and Gynecology | Admitting: Obstetrics and Gynecology

## 2023-09-21 DIAGNOSIS — Z3A01 Less than 8 weeks gestation of pregnancy: Secondary | ICD-10-CM

## 2023-09-21 DIAGNOSIS — O039 Complete or unspecified spontaneous abortion without complication: Secondary | ICD-10-CM | POA: Insufficient documentation

## 2023-09-21 NOTE — Discharge Instructions (Signed)
 We discussed that miscarriage is common with ~1/4 of women experiencing it in their lifetime. We discussed return precautions including crescendo abdominal pain, heavy vaginal bleeding soaking >1 pad/hour, and fever. I will send a message to the [facility] to schedule a follow up visit.

## 2023-09-21 NOTE — MAU Note (Signed)
.  Shari Davidson is a 33 y.o. at [redacted]w[redacted]d here in MAU reporting having some abdominal cramping and bleeding. Symptoms started Monday and tonight abdominal pain feels a lot like period cramps. Has used 2 pads today. Reports seeing some clots and ? Tissue Dr Crissie Reese in with bedside u/s  LMP: n/a Onset of complaint: monday Pain score: 8 Vitals:   09/21/23 2042 09/21/23 2044  BP:  (!) 110/57  Pulse: 77   Resp: 16   Temp: 99 F (37.2 C)   SpO2: 100%      FHT: n/a  Lab orders placed from triage: none

## 2023-09-21 NOTE — MAU Provider Note (Signed)
 History     [id number]  Arrival date and time: 09/21/23 2033    Chief Complaint  Patient presents with   Abdominal Pain   Vaginal Bleeding     HPI Shari Davidson is a 33 y.o. at [redacted]w[redacted]d by 6 wk [place] with PMHx notable for cesarean x2, who presents for vaginal bleeding.   Patient seen in MAU last week for vaginal bleeding, TVUS showed IUP and [facility]  Today reports bleeding has picked up significantly Has been passing large clots Also having some cramping Bleeding is like her period Pregnancy was unplanned and not desired No vaginal discharge or other symptoms  --/--/O POS (03/17 2209)  OB History     Gravida  5   Para  2   Term  2   Preterm      AB  2   Living  2      SAB  2   IAB      Ectopic      Multiple  0   Live Births  2           Past Medical History:  Diagnosis Date   Asthma    Chlamydia 08/06/11   treated at Urgent Care   Gonorrhea 04/20/12   treated at Urgent Care   Migraine    Myopia 07/08/2007   Qualifier: Diagnosis of  By: Humberto Seals NP, Jaquita Rector contact, treated     Past Surgical History:  Procedure Laterality Date   CESAREAN SECTION  12/01/2019   Procedure: CESAREAN SECTION;  Surgeon: Malachy Chamber, MD;  Location: [facility] LD ORS;  Service: Obstetrics;;   CESAREAN SECTION N/A 09/03/2021   Procedure: CESAREAN SECTION;  Surgeon: Kathrynn Running, MD;  Location: [facility] LD ORS;  Service: Obstetrics;  Laterality: N/A;    Family History  Problem Relation Age of Onset   Hypertension Other    Healthy Mother    Healthy Father     Social History   Socioeconomic History   Marital status: Single    Spouse name: Not on file   Number of children: Not on file   Years of education: Not on file   Highest education level: High school graduate  Occupational History   Not on file  Tobacco Use   Smoking status: Former    Current packs/day: 0.00    Types: Cigarettes    Quit date: 08/30/2014    Years since quitting: 9.0    Smokeless tobacco: Never  Vaping Use   Vaping status: Never Used  Substance and Sexual Activity   Alcohol use: Not Currently    Comment: OCCASIONAL   Drug use: No   Sexual activity: Yes    Birth control/protection: None  Other Topics Concern   Not on file  Social History Narrative    Lives with mother and 2 sisters   Social Drivers of Corporate investment banker Strain: Low Risk  (05/06/2019)   Overall Financial Resource Strain (CARDIA)    Difficulty of Paying Living Expenses: Not hard at all  Food Insecurity: No Food Insecurity (07/07/2021)   Hunger Vital Sign    Worried About Running Out of Food in the Last Year: Never true    Ran Out of Food in the Last Year: Never true  Transportation Needs: No Transportation Needs (07/07/2021)   PRAPARE - Administrator, Civil Service (Medical): No    Lack of Transportation (Non-Medical): No  Physical Activity: Inactive (05/22/2019)  Exercise Vital Sign    Days of Exercise per Week: 0 days    Minutes of Exercise per Session: 0 min  Stress: No Stress Concern Present (05/06/2019)   [employer] of Occupational Health - Occupational Stress Questionnaire    Feeling of Stress : Not at all  Social Connections: Moderately Isolated (05/22/2019)   Social Connection and Isolation Panel [NHANES]    Frequency of Communication with Friends and Family: More than three times a week    Frequency of Social Gatherings with Friends and Family: Once a week    Attends Religious Services: 1 to 4 times per year    Active Member of [employer] or Organizations: No    Attends Banker Meetings: Never    Marital Status: Never married  Intimate Partner Violence: Not At Risk (05/06/2019)   Humiliation, Afraid, Rape, and Kick questionnaire    Fear of Current or Ex-Partner: No    Emotionally Abused: No    Physically Abused: No    Sexually Abused: No    No Known Allergies  No current facility-administered medications on file prior to  encounter.   Current Outpatient Medications on File Prior to Encounter  Medication Sig Dispense Refill   albuterol (PROVENTIL HFA) 108 (90 Base) MCG/ACT inhaler Inhale 2 puffs into the lungs every 4 (four) hours as needed for shortness of breath or wheezing. for shortness of breath. 18 g 1   Doxylamine-Pyridoxine (DICLEGIS) 10-10 MG TBEC Take 2 tablets by mouth at bedtime as needed (nausea). 60 tablet 5   famotidine (PEPCID) 20 MG tablet Take 1 tablet (20 mg total) by mouth daily. Can increase to twice daily if need. For heartburn. 60 tablet 3   ondansetron (ZOFRAN-ODT) 4 MG disintegrating tablet Take 1 tablet (4 mg total) by mouth every 6 (six) hours as needed for nausea. 20 tablet 0   Prenatal Multivit-Min-FA ([facility] PRENATAL GUMMY) 0.4 MG CHEW Chew 1 each by mouth at bedtime. 30 tablet 11   simethicone (MYLICON) 80 MG chewable tablet Chew 1 tablet (80 mg total) by mouth 4 (four) times daily as needed for flatulence. 30 tablet 0     ROS Pertinent positives and negative per HPI, all others reviewed and negative  Physical Exam   BP (!) 110/57   Pulse 77   Temp 99 F (37.2 C)   Resp 16   Ht 5\' 4"  (1.626 m)   Wt 63.5 kg   LMP 08/02/2023   SpO2 100%   BMI 24.03 kg/m   Patient Vitals for the past 24 hrs:  BP Temp Pulse Resp SpO2 Height Weight  09/21/23 2044 (!) 110/57 -- -- -- -- -- --  09/21/23 2042 -- 99 F (37.2 C) 77 16 100 % 5\' 4"  (1.626 m) 63.5 kg    Physical Exam Vitals reviewed.  Constitutional:      General: She is not in acute distress.    Appearance: She is well-developed. She is not diaphoretic.  Eyes:     General: No scleral icterus. Pulmonary:     Effort: Pulmonary effort is normal. No respiratory distress.  Abdominal:     General: There is no distension.     Palpations: Abdomen is soft.     Tenderness: There is no abdominal tenderness. There is no guarding or rebound.  Skin:    General: Skin is warm and dry.  Neurological:     Mental Status: She is alert.      Coordination: Coordination normal.  Cervical Exam    Bedside Ultrasound Pt informed that the ultrasound is considered a limited OB ultrasound and is not intended to be a complete ultrasound exam.  Patient also informed that the ultrasound is not being completed with the intent of assessing for fetal or placental anomalies or any pelvic abnormalities.  Explained that the purpose of today's ultrasound is to assess for   viability .  Patient acknowledges the purpose of the exam and the limitations of the study.      My interpretation: mildly thickened endometrial stripe but no evidence of previously seen IUP, findings consistent with completed SAB   Labs No results found for this or any previous visit (from the past 24 hours).  Imaging No results found.  MAU Course  Procedures Lab Orders  No laboratory test(s) ordered today   No orders of the defined types were placed in this encounter.  Imaging Orders  No imaging studies ordered today    MDM Moderate (Level 3-4)  Assessment and Plan  # SAB #[redacted] weeks gestation of pregnancy Bedside US demonstrated no evidence of previously seen IUP, findings consistent with completed SAB. Patient took news well as pregnancy was unplanned and undesired. Inquired about future fertility, reassured patient that this was unlikely to affect it. Discussed routine return precautions. Declines birth control at present but does want follow up with PCP, message sent to [facility] to coordinate.    Dispo: discharged to home in stable condition    [name], MD/MPH 09/21/23 9:11 PM  Allergies as of 09/21/2023   No Known Allergies      Medication List     STOP taking these medications    [facility]S Prenatal Gummy 0.4 MG Chew       TAKE these medications    albuterol 108 (90 Base) MCG/ACT inhaler Commonly known as: Proventil HFA Inhale 2 puffs into the lungs every 4 (four) hours as needed for shortness of breath or wheezing. for  shortness of breath.   Diclegis 10-10 MG Tbec Generic drug: Doxylamine-Pyridoxine Take 2 tablets by mouth at bedtime as needed (nausea).   famotidine 20 MG tablet Commonly known as: Pepcid Take 1 tablet (20 mg total) by mouth daily. Can increase to twice daily if need. For heartburn.   ondansetron 4 MG disintegrating tablet Commonly known as: ZOFRAN-ODT Take 1 tablet (4 mg total) by mouth every 6 (six) hours as needed for nausea.   V-R GAS RELIEF 80 MG chewable tablet Generic drug: simethicone Chew 1 tablet (80 mg total) by mouth 4 (four) times daily as needed for flatulence.

## 2023-09-21 NOTE — Progress Notes (Signed)
 Dr Crissie Reese in Triage to give pt written and verbal d/c instructions. Questions answered and pt then d/c home by MD

## 2024-04-21 ENCOUNTER — Telehealth: Admitting: Emergency Medicine

## 2024-04-21 DIAGNOSIS — R3989 Other symptoms and signs involving the genitourinary system: Secondary | ICD-10-CM

## 2024-04-21 MED ORDER — CEPHALEXIN 500 MG PO CAPS: 500.0000 mg | ORAL_CAPSULE | Freq: Two times a day (BID) | ORAL | 0 refills | Status: AC

## 2024-04-21 NOTE — Progress Notes (Signed)
 Virtual Visit Consent   Shari Davidson, you are scheduled for a virtual visit with a [facility] provider today. Just as with appointments in the office, your consent must be obtained to participate. Your consent will be active for this visit and any virtual visit you may have with one of our providers in the next 365 days. If you have a MyChart account, a copy of this consent can be sent to you electronically.  As this is a virtual visit, video technology does not allow for your provider to perform a traditional examination. This may limit your provider's ability to fully assess your condition. If your provider identifies any concerns that need to be evaluated in person or the need to arrange testing (such as labs, EKG, etc.), we will make arrangements to do so. Although advances in technology are sophisticated, we cannot ensure that it will always work on either your end or our end. If the connection with a video visit is poor, the visit may have to be switched to a telephone visit. With either a video or telephone visit, we are not always able to ensure that we have a secure connection.  By engaging in this virtual visit, you consent to the provision of healthcare and authorize for your insurance to be billed (if applicable) for the services provided during this visit. Depending on your insurance coverage, you may receive a charge related to this service.  I need to obtain your verbal consent now. Are you willing to proceed with your visit today? Shari Emrie Gayle has provided verbal consent on 04/21/2024 for a virtual visit (video or telephone). Jon CHRISTELLA Belt, NP  Date: 04/21/2024 9:26 AM   Virtual Visit via Video Note   I, Jon CHRISTELLA Belt, connected with  Shari Davidson  ([id number], 11/21/1990) on 04/21/24 at  9:15 AM EDT by a video-enabled telemedicine application and verified that I am speaking with the correct person using two identifiers.  Location: Patient:  Virtual Visit Location Patient: Home Provider: Virtual Visit Location Provider: Home Office   I discussed the limitations of evaluation and management by telemedicine and the availability of in person appointments. The patient expressed understanding and agreed to proceed.    History of Present Illness: Shari Davidson is a 33 y.o. who identifies as a female who was assigned female at birth, and is being seen today for UTI. Cloudy urine, frequent urination, irritation when urinating. X2 days.   Tested positive on home kit for UTI. AZO is helping with pain. Increased water  intake.   Denies abd pain, n/v, fever/chills, vaginal discharge, back pain. Last uti is awhile ago no antibiotics recently.   HPI: HPI  Problems:  Patient Active Problem List   Diagnosis Date Noted   Status post repeat low transverse cesarean section 09/03/2021   Chronic constipation 02/20/2021   Sickle cell trait 07/25/2019   Depression, major, recurrent, moderate (HCC)    Asthma 08/26/2006    Allergies: No Known Allergies Medications:  Current Outpatient Medications:    cephALEXin  (KEFLEX ) 500 MG capsule, Take 1 capsule (500 mg total) by mouth 2 (two) times daily for 7 days., Disp: 14 capsule, Rfl: 0   albuterol  (PROVENTIL  HFA) 108 (90 Base) MCG/ACT inhaler, Inhale 2 puffs into the lungs every 4 (four) hours as needed for shortness of breath or wheezing. for shortness of breath., Disp: 18 g, Rfl: 1   Doxylamine -Pyridoxine  (DICLEGIS ) 10-10 MG TBEC, Take 2 tablets by mouth at bedtime as needed (nausea)., Disp: 60  tablet, Rfl: 5   famotidine  (PEPCID ) 20 MG tablet, Take 1 tablet (20 mg total) by mouth daily. Can increase to twice daily if need. For heartburn., Disp: 60 tablet, Rfl: 3   ondansetron  (ZOFRAN -ODT) 4 MG disintegrating tablet, Take 1 tablet (4 mg total) by mouth every 6 (six) hours as needed for nausea., Disp: 20 tablet, Rfl: 0   simethicone  (MYLICON) 80 MG chewable tablet, Chew 1 tablet (80 mg  total) by mouth 4 (four) times daily as needed for flatulence., Disp: 30 tablet, Rfl: 0  Observations/Objective: Patient is well-developed, well-nourished in no acute distress.  Resting comfortably  at home.  Head is normocephalic, atraumatic.  No labored breathing.  Speech is clear and coherent with logical content.  Patient is alert and oriented at baseline.    Assessment and Plan: 1. Suspected UTI (Primary) - cephALEXin  (KEFLEX ) 500 MG capsule; Take 1 capsule (500 mg total) by mouth 2 (two) times daily for 7 days.  Dispense: 14 capsule; Refill: 0    Follow Up Instructions: I discussed the assessment and treatment plan with the patient. The patient was provided an opportunity to ask questions and all were answered. The patient agreed with the plan and demonstrated an understanding of the instructions.  A copy of instructions were sent to the patient via MyChart unless otherwise noted below.   The patient was advised to call back or seek an in-person evaluation if the symptoms worsen or if the condition fails to improve as anticipated.    Jon CHRISTELLA Belt, NP

## 2024-04-21 NOTE — Patient Instructions (Signed)
  Shari Davidson, thank you for joining Jon CHRISTELLA Belt, NP for today's virtual visit.  While this provider is not your primary care provider (PCP), if your PCP is located in our provider database this encounter information will be shared with them immediately following your visit.   A [facility] MyChart account gives you access to today's visit and all your visits, tests, and labs performed at [facility]  click here if you don't have a [facility] MyChart account or go to [email or web address]  Consent: (Patient) Shari Davidson provided verbal consent for this virtual visit at the beginning of the encounter.  Current Medications:  Current Outpatient Medications:    cephALEXin  (KEFLEX ) 500 MG capsule, Take 1 capsule (500 mg total) by mouth 2 (two) times daily for 7 days., Disp: 14 capsule, Rfl: 0   albuterol  (PROVENTIL  HFA) 108 (90 Base) MCG/ACT inhaler, Inhale 2 puffs into the lungs every 4 (four) hours as needed for shortness of breath or wheezing. for shortness of breath., Disp: 18 g, Rfl: 1   Doxylamine -Pyridoxine  (DICLEGIS ) 10-10 MG TBEC, Take 2 tablets by mouth at bedtime as needed (nausea)., Disp: 60 tablet, Rfl: 5   famotidine  (PEPCID ) 20 MG tablet, Take 1 tablet (20 mg total) by mouth daily. Can increase to twice daily if need. For heartburn., Disp: 60 tablet, Rfl: 3   ondansetron  (ZOFRAN -ODT) 4 MG disintegrating tablet, Take 1 tablet (4 mg total) by mouth every 6 (six) hours as needed for nausea., Disp: 20 tablet, Rfl: 0   simethicone  (MYLICON) 80 MG chewable tablet, Chew 1 tablet (80 mg total) by mouth 4 (four) times daily as needed for flatulence., Disp: 30 tablet, Rfl: 0   Medications ordered in this encounter:  Meds ordered this encounter  Medications   cephALEXin  (KEFLEX ) 500 MG capsule    Sig: Take 1 capsule (500 mg total) by mouth 2 (two) times daily for 7 days.    Dispense:  14 capsule    Refill:  0     *If you need refills on other  medications prior to your next appointment, please contact your pharmacy*  Follow-Up: Call back or seek an in-person evaluation if the symptoms worsen or if the condition fails to improve as anticipated.  [facility] [phone]  Other Instructions  Fleurette all of the antibiotics, even if you feel better sooner.   If you are not getting better, please get checked at an in person clinic.    If you have been instructed to have an in-person evaluation today at a local Urgent Care facility, please use the link below. It will take you to a list of all of our available [facility], including address, phone number and hours of operation. Please do not delay care.  [facility]  If you or a family member do not have a primary care provider, use the link below to schedule a visit and establish care. When you choose a [facility] primary care physician or advanced practice provider, you gain a long-term partner in health. Find a Primary Care Provider  Learn more about [facility]'s in-office and virtual care options:  - Get Care Now

## 2024-06-19 ENCOUNTER — Encounter (HOSPITAL_COMMUNITY): Payer: Self-pay

## 2024-06-19 ENCOUNTER — Ambulatory Visit (HOSPITAL_COMMUNITY): Admission: EM | Admit: 2024-06-19 | Discharge: 2024-06-19 | Disposition: A | Discharge status: Home / Self Care

## 2024-06-19 DIAGNOSIS — R300 Dysuria: Secondary | ICD-10-CM | POA: Insufficient documentation

## 2024-06-19 LAB — POCT URINE DIPSTICK
Bilirubin, UA: NEGATIVE
Blood, UA: NEGATIVE
Glucose, UA: NEGATIVE mg/dL
Ketones, POC UA: NEGATIVE mg/dL
Leukocytes, UA: NEGATIVE
Nitrite, UA: POSITIVE — AB
POC PROTEIN,UA: NEGATIVE
Spec Grav, UA: 1.015
Urobilinogen, UA: 0.2 U/dL
pH, UA: 6.5

## 2024-06-19 MED ORDER — NITROFURANTOIN MONOHYD MACRO 100 MG PO CAPS: 100.0000 mg | ORAL_CAPSULE | Freq: Two times a day (BID) | ORAL | 0 refills | Status: AC

## 2024-06-19 MED ORDER — ONDANSETRON 4 MG PO TBDP: 4.0000 mg | ORAL_TABLET | Freq: Three times a day (TID) | ORAL | 0 refills | Status: AC | PRN

## 2024-06-19 NOTE — ED Triage Notes (Signed)
 Patient here today with c/o burning in urination, lower back pain, nausea, loss of appetite, chills, and fatigue X 1.5 weeks. Patient has taken AZO with some relief. Patient states that she has an odor and a smell.

## 2024-06-19 NOTE — ED Provider Notes (Signed)
 " [facility]    CSN: [id number] Arrival date & time: 06/19/24  1831      History   Chief Complaint Chief Complaint  Patient presents with   Dysuria    HPI Anett Carin Shipp is a 33 y.o. female.   This 33 year old female is being seen for complaints of dysuria.  She reports urinary tract infection in October.  Her symptoms had resolved but returned approximately 1.5 weeks ago.  She complains of chills, nausea, dysuria.  She has taken AZO with some improvement of symptoms.  She denies headache, dizziness, fever.  She denies shortness of breath, chest pain.  She denies abdominal pain, vomiting, diarrhea.   Dysuria Associated symptoms: nausea   Associated symptoms: no abdominal pain, no fever and no vomiting     Past Medical History:  Diagnosis Date   Asthma    Chlamydia 08/06/11   treated at Urgent Care   Gonorrhea 04/20/12   treated at Urgent Care   Migraine    Myopia 07/08/2007   Qualifier: Diagnosis of  By: Gerlene NP, Devere Pons contact, treated     Patient Active Problem List   Diagnosis Date Noted   Status post repeat low transverse cesarean section 09/03/2021   Chronic constipation 02/20/2021   Sickle cell trait 07/25/2019   Depression, major, recurrent, moderate (HCC)    Asthma 08/26/2006    Past Surgical History:  Procedure Laterality Date   CESAREAN SECTION  12/01/2019   Procedure: CESAREAN SECTION;  Surgeon: Trudy Rozelle DASEN, MD;  Location: [facility];  Service: Obstetrics;;   CESAREAN SECTION N/A 09/03/2021   Procedure: CESAREAN SECTION;  Surgeon: Kandis Devaughn Sayres, MD;  Location: [facility];  Service: Obstetrics;  Laterality: N/A;    OB History     Gravida  5   Para  2   Term  2   Preterm      AB  2   Living  2      SAB  2   IAB      Ectopic      Multiple  0   Live Births  2            Home Medications    Prior to Admission medications  Medication Sig Start Date End Date Taking? Authorizing  Provider  nitrofurantoin , macrocrystal-monohydrate, (MACROBID ) 100 MG capsule Take 1 capsule (100 mg total) by mouth 2 (two) times daily for 5 days. 06/19/24 06/24/24 Yes Elisea Khader C, FNP  ondansetron  (ZOFRAN -ODT) 4 MG disintegrating tablet Take 1 tablet (4 mg total) by mouth every 8 (eight) hours as needed for nausea or vomiting. 06/19/24  Yes Lennice Jon BROCKS, FNP  albuterol  (PROVENTIL  HFA) 108 (90 Base) MCG/ACT inhaler Inhale 2 puffs into the lungs every 4 (four) hours as needed for shortness of breath or wheezing. for shortness of breath. 06/16/21   Jarrett Lucie SAILOR, DO    Family History Family History  Problem Relation Age of Onset   Hypertension Other    Healthy Mother    Healthy Father     Social History Social History[1]   Allergies   Patient has no known allergies.   Review of Systems Review of Systems  Constitutional:  Positive for chills. Negative for fever.  Respiratory:  Negative for shortness of breath.   Cardiovascular:  Negative for chest pain.  Gastrointestinal:  Positive for nausea. Negative for abdominal pain, diarrhea and vomiting.  Genitourinary:  Positive for dysuria. Negative for  difficulty urinating.  Skin:  Negative for color change and rash.  Neurological:  Negative for dizziness and headaches.  All other systems reviewed and are negative.    Physical Exam Triage Vital Signs ED Triage Vitals  Encounter Vitals Group     BP 06/19/24 2003 110/65     Girls Systolic BP Percentile --      Girls Diastolic BP Percentile --      Boys Systolic BP Percentile --      Boys Diastolic BP Percentile --      Pulse Rate 06/19/24 2003 67     Resp 06/19/24 2003 16     Temp 06/19/24 2003 98.8 F (37.1 C)     Temp Source 06/19/24 2003 Oral     SpO2 06/19/24 2003 98 %     Weight --      Height --      Head Circumference --      Peak Flow --      Pain Score 06/19/24 2001 10     Pain Loc --      Pain Education --      Exclude from Growth Chart --    No  data found.  Updated Vital Signs BP 110/65 (BP Location: Left Arm)   Pulse 67   Temp 98.8 F (37.1 C) (Oral)   Resp 16   LMP 06/05/2024 (Approximate)   SpO2 98%   Visual Acuity Right Eye Distance:   Left Eye Distance:   Bilateral Distance:    Right Eye Near:   Left Eye Near:    Bilateral Near:     Physical Exam Vitals and nursing note reviewed.  Constitutional:      General: She is awake. She is not in acute distress.    Appearance: Normal appearance. She is well-developed. She is not ill-appearing or toxic-appearing.     Comments: Pleasant female appearing stated age found sitting in chair in no acute distress.  HENT:     Head: Normocephalic and atraumatic.  Eyes:     Conjunctiva/sclera: Conjunctivae normal.  Cardiovascular:     Rate and Rhythm: Normal rate and regular rhythm.     Heart sounds: Normal heart sounds. No murmur heard. Pulmonary:     Effort: Pulmonary effort is normal. No respiratory distress.     Breath sounds: Normal breath sounds.  Abdominal:     General: Bowel sounds are normal.     Palpations: Abdomen is soft.     Tenderness: There is no abdominal tenderness.  Skin:    General: Skin is warm and dry.     Capillary Refill: Capillary refill takes less than 2 seconds.  Neurological:     Mental Status: She is alert.  Psychiatric:        Mood and Affect: Mood normal.        Behavior: Behavior is cooperative.      UC Treatments / Results  Labs (all labs ordered are listed, but only abnormal results are displayed) Labs Reviewed  POCT URINE DIPSTICK - Abnormal; Notable for the following components:      Result Value   Nitrite, UA Positive (*)    All other components within normal limits  URINE CULTURE    EKG   Radiology No results found.  Procedures Procedures (including critical care time)  Medications Ordered in UC Medications - No data to display  Initial Impression / Assessment and Plan / UC Course  I have reviewed the triage  vital signs and the  nursing notes.  Pertinent labs & imaging results that were available during my care of the patient were reviewed by me and considered in my medical decision making (see chart for details).     Vitals and triage reviewed, patient is hemodynamically stable.  UA significant for positive nitrites.  She reported some improvement of dysuria with AZO tablets.  She is given prescription for nitrofurantoin , ondansetron .  Urine will be sent for culture.  She will be notified if change in antibiotic is needed.  Plan of care, follow-up care, return precautions given, no questions at this time. Final Clinical Impressions(s) / UC Diagnoses   Final diagnoses:  Dysuria     Discharge Instructions      Your urine shows you likely have a urinary tract infection.   I have sent your urine for culture to confirm this.   We will call you if we need to change your antibiotic when we find out the type of bacteria growing in your bladder.  Take antibiotic as directed with a snack/food to avoid stomach upset. To avoid GI upset please take this medication with food.  Take ondansetron  every 8 hours as needed for nausea, vomiting.  Avoid drinking beverages that irritate the urinary tract like sodas, tea, coffee, or juice. Drink plenty of water  to stay well hydrated and prevent severe infection.  If you develop pain in your upper back on one side, fever despite taking antibiotic, nausea and vomiting where you cannot keep anything down for 24 hours, dizziness/severe headache, or decreased urinary output, please go to the ER. Follow-up with PCP.       ED Prescriptions     Medication Sig Dispense Auth. Provider   nitrofurantoin , macrocrystal-monohydrate, (MACROBID ) 100 MG capsule Take 1 capsule (100 mg total) by mouth 2 (two) times daily for 5 days. 10 capsule Zaakirah Kistner C, FNP   ondansetron  (ZOFRAN -ODT) 4 MG disintegrating tablet Take 1 tablet (4 mg total) by mouth every 8 (eight) hours  as needed for nausea or vomiting. 10 tablet Yousra Ivens C, FNP      PDMP not reviewed this encounter.    [1]  Social History Tobacco Use   Smoking status: Former    Current packs/day: 0.00    Average packs/day: 0.3 packs/day    Types: Cigarettes    Quit date: 08/30/2014    Years since quitting: 9.8   Smokeless tobacco: Never  Vaping Use   Vaping status: Never Used  Substance Use Topics   Alcohol use: Not Currently    Comment: OCCASIONAL   Drug use: No     Lennice Jon BROCKS, FNP 06/19/24 2059  "

## 2024-06-19 NOTE — Discharge Instructions (Addendum)
 Your urine shows you likely have a urinary tract infection.   I have sent your urine for culture to confirm this.   We will call you if we need to change your antibiotic when we find out the type of bacteria growing in your bladder.  Take antibiotic as directed with a snack/food to avoid stomach upset. To avoid GI upset please take this medication with food.  Take ondansetron  every 8 hours as needed for nausea, vomiting.  Avoid drinking beverages that irritate the urinary tract like sodas, tea, coffee, or juice. Drink plenty of water  to stay well hydrated and prevent severe infection.  If you develop pain in your upper back on one side, fever despite taking antibiotic, nausea and vomiting where you cannot keep anything down for 24 hours, dizziness/severe headache, or decreased urinary output, please go to the ER. Follow-up with PCP.

## 2024-06-21 ENCOUNTER — Ambulatory Visit (HOSPITAL_COMMUNITY): Payer: Self-pay

## 2024-06-21 LAB — URINE CULTURE: Culture: >=100000 — AB

## 2024-09-12 ENCOUNTER — Ambulatory Visit: Admitting: Family Medicine
